# Patient Record
Sex: Male | Born: 1975 | Race: Black or African American | Hispanic: No | Marital: Married | Smoking: Never smoker
Health system: Southern US, Community
[De-identification: ages and names within clinical notes are randomized; demographics above are authoritative.]

## PROBLEM LIST (undated history)

## (undated) DIAGNOSIS — F32A Depression, unspecified: Secondary | ICD-10-CM

## (undated) DIAGNOSIS — E119 Type 2 diabetes mellitus without complications: Secondary | ICD-10-CM

## (undated) DIAGNOSIS — I739 Peripheral vascular disease, unspecified: Secondary | ICD-10-CM

## (undated) DIAGNOSIS — E11319 Type 2 diabetes mellitus with unspecified diabetic retinopathy without macular edema: Secondary | ICD-10-CM

## (undated) DIAGNOSIS — E13319 Other specified diabetes mellitus with unspecified diabetic retinopathy without macular edema: Secondary | ICD-10-CM

## (undated) DIAGNOSIS — F329 Major depressive disorder, single episode, unspecified: Secondary | ICD-10-CM

## (undated) DIAGNOSIS — E785 Hyperlipidemia, unspecified: Secondary | ICD-10-CM

## (undated) DIAGNOSIS — I10 Essential (primary) hypertension: Secondary | ICD-10-CM

## (undated) HISTORY — DX: Essential (primary) hypertension: I10

## (undated) HISTORY — DX: Hyperlipidemia, unspecified: E78.5

## (undated) HISTORY — DX: Other specified diabetes mellitus with unspecified diabetic retinopathy without macular edema: E13.319

## (undated) HISTORY — DX: Type 2 diabetes mellitus without complications: E11.9

## (undated) HISTORY — DX: Type 2 diabetes mellitus with unspecified diabetic retinopathy without macular edema: E11.319

## (undated) HISTORY — PX: EYE SURGERY: SHX253

## (undated) HISTORY — PX: WISDOM TOOTH EXTRACTION: SHX21

---

## 2017-07-13 ENCOUNTER — Ambulatory Visit
Admission: RE | Admit: 2017-07-13 | Discharge: 2017-07-13 | Disposition: A | Discharge status: Home / Self Care | Payer: Managed Care, Other (non HMO) | Source: Ambulatory Visit | Attending: Internal Medicine | Admitting: Internal Medicine

## 2017-07-13 ENCOUNTER — Other Ambulatory Visit
Admission: RE | Admit: 2017-07-13 | Discharge: 2017-07-13 | Disposition: A | Discharge status: Home / Self Care | Payer: Managed Care, Other (non HMO) | Source: Ambulatory Visit | Attending: Internal Medicine | Admitting: Internal Medicine

## 2017-07-13 ENCOUNTER — Other Ambulatory Visit: Payer: Self-pay | Admitting: Internal Medicine

## 2017-07-13 ENCOUNTER — Encounter: Payer: Managed Care, Other (non HMO) | Attending: Internal Medicine | Admitting: Internal Medicine

## 2017-07-13 DIAGNOSIS — S81801A Unspecified open wound, right lower leg, initial encounter: Secondary | ICD-10-CM | POA: Diagnosis present

## 2017-07-13 DIAGNOSIS — X58XXXA Exposure to other specified factors, initial encounter: Secondary | ICD-10-CM | POA: Diagnosis not present

## 2017-07-13 DIAGNOSIS — B999 Unspecified infectious disease: Secondary | ICD-10-CM | POA: Diagnosis not present

## 2017-07-13 DIAGNOSIS — I10 Essential (primary) hypertension: Secondary | ICD-10-CM | POA: Insufficient documentation

## 2017-07-13 DIAGNOSIS — L03115 Cellulitis of right lower limb: Secondary | ICD-10-CM | POA: Diagnosis not present

## 2017-07-13 DIAGNOSIS — L97511 Non-pressure chronic ulcer of other part of right foot limited to breakdown of skin: Secondary | ICD-10-CM | POA: Insufficient documentation

## 2017-07-13 DIAGNOSIS — E11621 Type 2 diabetes mellitus with foot ulcer: Secondary | ICD-10-CM | POA: Diagnosis not present

## 2017-07-13 DIAGNOSIS — E1142 Type 2 diabetes mellitus with diabetic polyneuropathy: Secondary | ICD-10-CM | POA: Diagnosis not present

## 2017-07-13 DIAGNOSIS — E1151 Type 2 diabetes mellitus with diabetic peripheral angiopathy without gangrene: Secondary | ICD-10-CM | POA: Insufficient documentation

## 2017-07-14 ENCOUNTER — Other Ambulatory Visit: Payer: Self-pay | Admitting: Internal Medicine

## 2017-07-14 DIAGNOSIS — L97511 Non-pressure chronic ulcer of other part of right foot limited to breakdown of skin: Secondary | ICD-10-CM

## 2017-07-15 ENCOUNTER — Ambulatory Visit (INDEPENDENT_AMBULATORY_CARE_PROVIDER_SITE_OTHER): Payer: Managed Care, Other (non HMO)

## 2017-07-15 DIAGNOSIS — L97511 Non-pressure chronic ulcer of other part of right foot limited to breakdown of skin: Secondary | ICD-10-CM

## 2017-07-15 NOTE — Progress Notes (Signed)
Ryan Leonard, Amritpal ([id number]) Visit Report for 07/13/2017 Abuse/Suicide Risk Screen Details Patient Name: Ryan Leonard, Ryan Leonard Date of Service: 07/13/2017 10:30 AM Medical Record Number: [id number] Patient Account Number: [id number] Date of Birth/Sex: Oct 17, 1975 [age] y.o. Male) Treating RN: Phillis HaggisPinkerton, Debi Primary Care Erica Osuna: Naoma DienerKOMIVES, EUGENIE Other Clinician: Referring [place]: Oren BeckmannBURKE, GARY Treating Khalidah Herbold/Extender: Maxwell CaulOBSON, MICHAEL G Weeks in Treatment: 0 Abuse/Suicide Risk Screen Items Answer ABUSE/SUICIDE RISK SCREEN: Has anyone close to you tried to hurt or harm you recentlyo No Do you feel uncomfortable with anyone in your familyo No Has anyone forced you do things that you didnot want to doo No Do you have any thoughts of harming yourselfo No Electronic Signature(s) Signed: 07/14/2017 3:16:44 PM By: Alejandro MullingPinkerton, Debra Entered By: Alejandro MullingPinkerton, Debra on 07/13/2017 11:03:09 Ryan Leonard, Ladarrion ([id number]) -------------------------------------------------------------------------------- Activities of Daily Living Details Patient Name: Ryan Leonard, Ryan Leonard Date of Service: 07/13/2017 10:30 AM Medical Record Number: [id number] Patient Account Number: [id number] Date of Birth/Sex: Oct 17, 1975 [age] y.o. Male) Treating RN: Phillis HaggisPinkerton, Debi Primary Care Bernhard Koskinen: Naoma DienerKOMIVES, EUGENIE Other Clinician: Referring Briar Sword: Oren BeckmannBURKE, GARY Treating Demetrie Borge/Extender: Maxwell CaulOBSON, MICHAEL G Weeks in Treatment: 0 Activities of Daily Living Items Answer Activities of Daily Living (Please select one for each item) Drive Automobile Completely Able Take Medications Completely Able Use Telephone Completely Able Care for Appearance Completely Able Use Toilet Completely Able Bath / Shower Completely Able Dress Self Completely Able Feed Self Completely Able Walk Completely Able Get In / Out Bed Completely Able Housework Completely Able Prepare Meals Completely Able Handle Money Completely Able Shop for Self Completely  Able Electronic Signature(s) Signed: 07/14/2017 3:16:44 PM By: Alejandro MullingPinkerton, Debra Entered By: Alejandro MullingPinkerton, Debra on 07/13/2017 11:03:25 Ryan Leonard, Coby ([id number]) -------------------------------------------------------------------------------- Education Assessment Details Patient Name: Ryan Leonard, Ryan Leonard Date of Service: 07/13/2017 10:30 AM Medical Record Number: [id number] Patient Account Number: [id number] Date of Birth/Sex: Oct 17, 1975 [age] y.o. Male) Treating RN: Phillis HaggisPinkerton, Debi Primary Care Edell Mesenbrink: Naoma DienerKOMIVES, EUGENIE Other Clinician: Referring Elvin Mccartin: Oren BeckmannBURKE, GARY Treating Duel Conrad/Extender: Altamese CarolinaOBSON, MICHAEL G Weeks in Treatment: 0 Primary Learner Assessed: Patient Learning Preferences/Education Level/Primary Language Learning Preference: Explanation, Printed Material Highest Education Level: College or Above Preferred Language: English Cognitive Barrier Assessment/Beliefs Language Barrier: No Translator Needed: No Memory Deficit: No Emotional Barrier: No Cultural/Religious Beliefs Affecting Medical Care: No Physical Barrier Assessment Impaired Vision: Yes Glasses Impaired Hearing: No Decreased Hand dexterity: No Knowledge/Comprehension Assessment Knowledge Level: Medium Comprehension Level: Medium Ability to understand written Medium instructions: Ability to understand verbal Medium instructions: Motivation Assessment Anxiety Level: Calm Cooperation: Cooperative Education Importance: Acknowledges Need Interest in Health Problems: Asks Questions Perception: Coherent Willingness to Engage in Self- Medium Management Activities: Readiness to Engage in Self- Medium Management Activities: Electronic Signature(s) Signed: 07/14/2017 3:16:44 PM By: Alejandro MullingPinkerton, Debra Entered By: Alejandro MullingPinkerton, Debra on 07/13/2017 11:03:48 Ryan Leonard, Leonell ([id number]) -------------------------------------------------------------------------------- Fall Risk Assessment Details Patient Name: Ryan Leonard,  Ryan Leonard Date of Service: 07/13/2017 10:30 AM Medical Record Number: [id number] Patient Account Number: [id number] Date of Birth/Sex: Oct 17, 1975 [age] y.o. Male) Treating RN: Ashok CordiaPinkerton, Debi Primary Care Gayla Benn: Naoma DienerKOMIVES, EUGENIE Other Clinician: Referring Suellen Durocher: Oren BeckmannBURKE, GARY Treating Deyci Gesell/Extender: Altamese CarolinaOBSON, MICHAEL G Weeks in Treatment: 0 Fall Risk Assessment Items Have you had 2 or more falls in the last 12 monthso 0 No Have you had any fall that resulted in injury in the last 12 monthso 0 No FALL RISK ASSESSMENT: History of falling - immediate or within 3 months 0 No Secondary diagnosis 0 No Ambulatory aid None/bed rest/wheelchair/nurse 0 No Crutches/cane/walker 0 No Furniture 0 No IV Access/Saline Lock 0 No Gait/Training Normal/bed rest/immobile 0 No Weak 0 No Impaired  0 No Mental Status Oriented to own ability 0 Yes Electronic Signature(s) Signed: 07/14/2017 3:16:44 PM By: Alejandro Mulling Entered By: Alejandro Mulling on 07/13/2017 11:03:56 Ryan Leonard ([id number]) -------------------------------------------------------------------------------- Foot Assessment Details Patient Name: Ryan Leonard Date of Service: 07/13/2017 10:30 AM Medical Record Number: [id number] Patient Account Number: [id number] Date of Birth/Sex: [birth year] (42 y.o. Male) Treating RN: Phillis Haggis Primary Care Jensine Luz: Naoma Diener Other Clinician: Referring Javonda Suh: Oren Beckmann Treating Iren Whipp/Extender: Maxwell Caul Weeks in Treatment: 0 Foot Assessment Items Site Locations + = Sensation present, - = Sensation absent, C = Callus, U = Ulcer R = Redness, W = Warmth, M = Maceration, PU = Pre-ulcerative lesion F = Fissure, S = Swelling, D = Dryness Assessment Right: Left: Other Deformity: No No Prior Foot Ulcer: No No Prior Amputation: No No Charcot Joint: No No Ambulatory Status: Ambulatory Without Help Gait: Steady Electronic Signature(s) Signed: 07/14/2017 3:16:44 PM By:  Alejandro Mulling Entered By: Alejandro Mulling on 07/13/2017 11:05:22 Ryan Leonard ([id number]) -------------------------------------------------------------------------------- Nutrition Risk Assessment Details Patient Name: Ryan Leonard Date of Service: 07/13/2017 10:30 AM Medical Record Number: [id number] Patient Account Number: [id number] Date of Birth/Sex: 04-Jan-1976 (42 y.o. Male) Treating RN: Ashok Cordia, Debi Primary Care Jesiah Yerby: Naoma Diener Other Clinician: Referring Jessyca Sloan: Oren Beckmann Treating Lamari Beckles/Extender: Maxwell Caul Weeks in Treatment: 0 Height (in): 72 Weight (lbs): 226.5 Body Mass Index (BMI): 30.7 Nutrition Risk Assessment Items NUTRITION RISK SCREEN: I have an illness or condition that made me change the kind and/or amount of 2 Yes food I eat I eat fewer than two meals per day 3 Yes I eat few fruits and vegetables, or milk products 0 No I have three or more drinks of beer, liquor or wine almost every day 0 No I have tooth or mouth problems that make it hard for me to eat 0 No I don't always have enough money to buy the food I need 0 No I eat alone most of the time 0 No I take three or more different prescribed or over-the-counter drugs a day 1 Yes Without wanting to, I have lost or gained 10 pounds in the last six months 0 No I am not always physically able to shop, cook and/or feed myself 0 No Nutrition Protocols Good Risk Protocol Moderate Risk Protocol Electronic Signature(s) Signed: 07/14/2017 3:16:44 PM By: Alejandro Mulling Entered By: Alejandro Mulling on 07/13/2017 11:04:14

## 2017-07-15 NOTE — Progress Notes (Signed)
VERDUN, RACKLEY ([id number]) Visit Report for 07/13/2017 Chief Complaint Document Details Patient Name: Ryan Leonard, Ryan Leonard Date of Service: 07/13/2017 10:30 AM Medical Record Number: [id number] Patient Account Number: [id number] Date of Birth/Sex: 09/14/1975 (42 y.o. Male) Treating RN: Phillis Haggis Primary Care Provider: Naoma Diener Other Clinician: Referring Provider: Oren Beckmann Treating Provider/Extender: Maxwell Caul Weeks in Treatment: 0 Information Obtained from: Patient Chief Complaint 07/13/17; patient is here for review of ulcers on his right foot in the setting of uncontrolled diabetes Electronic Signature(s) Signed: 07/13/2017 4:49:28 PM By: Baltazar Najjar MD Entered By: Baltazar Najjar on 07/13/2017 12:32:36 Ryan Leonard ([id number]) -------------------------------------------------------------------------------- Debridement Details Patient Name: Ryan Leonard Date of Service: 07/13/2017 10:30 AM Medical Record Number: [id number] Patient Account Number: [id number] Date of Birth/Sex: 11/28/1975 (42 y.o. Male) Treating RN: Phillis Haggis Primary Care Provider: Naoma Diener Other Clinician: Referring Provider: Oren Beckmann Treating Provider/Extender: Altamese [place] in Treatment: 0 Debridement Performed for Wound #2 Right,Proximal,Dorsal Foot Assessment: Performed By: Physician Maxwell Caul, MD Debridement: Debridement Severity of Tissue Pre Fat layer exposed Debridement: Pre-procedure Verification/Time Yes - 12:01 Out Taken: Start Time: 12:04 Pain Control: Lidocaine 4% Topical Solution Level: Skin/Subcutaneous Tissue Total Area Debrided (L x W): 1 (cm) x 0.9 (cm) = 0.9 (cm) Tissue and other material Viable, Non-Viable, Exudate, Fibrin/Slough, Subcutaneous debrided: Instrument: Curette Bleeding: Minimum Hemostasis Achieved: Pressure End Time: 12:06 Procedural Pain: 0 Post Procedural Pain: 0 Response to Treatment: Procedure was  tolerated well Post Debridement Measurements of Total Wound Length: (cm) 1 Width: (cm) 0.9 Depth: (cm) 0.2 Volume: (cm) 0.141 Character of Wound/Ulcer Post Debridement: Requires Further Debridement Severity of Tissue Post Debridement: Fat layer exposed Post Procedure Diagnosis Same as Pre-procedure Electronic Signature(s) Signed: 07/13/2017 4:49:28 PM By: Baltazar Najjar MD Signed: 07/14/2017 3:16:44 PM By: Alejandro Mulling Entered By: Baltazar Najjar on 07/13/2017 12:30:05 Ryan Leonard ([id number]) -------------------------------------------------------------------------------- Debridement Details Patient Name: Ryan Leonard Date of Service: 07/13/2017 10:30 AM Medical Record Number: [id number] Patient Account Number: [id number] Date of Birth/Sex: 07-25-1975 (42 y.o. Male) Treating RN: Phillis Haggis Primary Care Provider: Naoma Diener Other Clinician: Referring Provider: Oren Beckmann Treating Provider/Extender: Maxwell Caul Weeks in Treatment: 0 Debridement Performed for Wound #3 Right Lower Leg Assessment: Performed By: Physician Maxwell Caul, MD Debridement: Debridement Severity of Tissue Pre Fat layer exposed Debridement: Pre-procedure Verification/Time Yes - 12:01 Out Taken: Start Time: 12:02 Pain Control: Lidocaine 4% Topical Solution Level: Skin/Subcutaneous Tissue Total Area Debrided (L x W): 1.7 (cm) x 2.2 (cm) = 3.74 (cm) Tissue and other material Viable, Non-Viable, Exudate, Fibrin/Slough, Subcutaneous debrided: Instrument: Curette Specimen: Swab Number of Specimens Taken: 1 Bleeding: Minimum Hemostasis Achieved: Pressure End Time: 12:04 Procedural Pain: 0 Post Procedural Pain: 0 Response to Treatment: Procedure was tolerated well Post Debridement Measurements of Total Wound Length: (cm) 1.7 Width: (cm) 2.2 Depth: (cm) 0.2 Volume: (cm) 0.587 Character of Wound/Ulcer Post Debridement: Requires Further Debridement Severity of Tissue  Post Debridement: Fat layer exposed Post Procedure Diagnosis Same as Pre-procedure Electronic Signature(s) Signed: 07/13/2017 4:49:28 PM By: Baltazar Najjar MD Signed: 07/14/2017 3:16:44 PM By: Alejandro Mulling Entered By: Baltazar Najjar on 07/13/2017 12:30:13 Ryan Leonard ([id number]) -------------------------------------------------------------------------------- HPI Details Patient Name: Ryan Leonard Date of Service: 07/13/2017 10:30 AM Medical Record Number: [id number] Patient Account Number: [id number] Date of Birth/Sex: 1976/03/21 (42 y.o. Male) Treating RN: Phillis Haggis Primary Care Provider: Naoma Diener Other Clinician: Referring Provider: Oren Beckmann Treating Provider/Extender: Maxwell Caul Weeks in Treatment: 0 History of Present Illness HPI Description: 07/13/17; this is a 42 year old man  who is a poorly controlled diabetic with a hemoglobin A1c of over 14. Recently transitioned from oral agents to the addition of long-acting basal insulin. For the last 2 weeks he has had problems with ulcerations on his right foot this includes 2 shallow ulcerations one just distal to the ankle and a smaller one distal to this and a new recent blister just proximal to his toes. Last week his entire foot and lower ankle became intensely swollen and painful he was seen in the emergency room on 07/10/17 at the [facility] clinic in [employer] Diagnosed with a diabetic foot infection and put on Augmentin. This has helped somewhat with the swelling but he is still having a fair amount of pain and yesterday found it difficult to walk. He has not been systemically unwell. ABIs in this clinic were 0.88 and on the right 0.7-1 the left. He will definitely need formal arterial studies Electronic Signature(s) Signed: 07/13/2017 4:49:28 PM By: Baltazar Najjar MD Entered By: Baltazar Najjar on 07/13/2017 12:41:27 Ryan Leonard  ([id number]) -------------------------------------------------------------------------------- Incision and Drainage Details Patient Name: Ryan Leonard Date of Service: 07/13/2017 10:30 AM Medical Record Number: [id number] Patient Account Number: [id number] Date of Birth/Sex: Mar 31, 1976 (42 y.o. Male) Treating RN: Phillis Haggis Primary Care Provider: Naoma Diener Other Clinician: Referring Provider: Oren Beckmann Treating Provider/Extender: Maxwell Caul Weeks in Treatment: 0 Incision And Drainage Wound #1 Right, Distal, Dorsal Foot Performed for: Performed By: Physician Maxwell Caul, MD Incision And Drainage Single Type: Location: right distal dorsal foot Pre-procedure Yes - 11:58 Verification/Time Out Taken: Pain Control: Lidocaine 4% Topical Solution Drainage Of: Serous Bleeding: None Culture Sent: Swab Procedural Pain: 0 Post Procedural Pain: 0 Response to Treatment: Procedure was tolerated well Post Procedure Diagnosis Same as Pre-procedure Electronic Signature(s) Signed: 07/13/2017 4:49:28 PM By: Baltazar Najjar MD Entered By: Baltazar Najjar on 07/13/2017 12:29:56 Ryan Leonard ([id number]) -------------------------------------------------------------------------------- Physical Exam Details Patient Name: Ryan Leonard Date of Service: 07/13/2017 10:30 AM Medical Record Number: [id number] Patient Account Number: [id number] Date of Birth/Sex: 02/09/1976 (42 y.o. Male) Treating RN: Phillis Haggis Primary Care Provider: Naoma Diener Other Clinician: Referring Provider: Oren Beckmann Treating Provider/Extender: Maxwell Caul Weeks in Treatment: 0 Constitutional Patient is hypertensive.. Pulse regular and within target range for patient.Marland Kitchen Respirations regular, non-labored and within target range.. Temperature is normal and within the target range for the patient.Marland Kitchen appears in no distress. Eyes Conjunctivae clear. No discharge. Respiratory Respiratory  effort is easy and symmetric bilaterally. Rate is normal at rest and on room air.. Bilateral breath sounds are clear and equal in all lobes with no wheezes, rales or rhonchi.. Cardiovascular Heart rhythm and rate regular, without murmur or gallop.. pedal pulses are very faint. I had trouble with popliteal pulses femoral pulses palpable. Gastrointestinal (GI) Abdomen is soft and non-distended without masses or tenderness. Bowel sounds active in all quadrants.. Lymphatic none palpable in the popliteal or inguinal area. Integumentary (Hair, Skin) multiple dark healed macular areas on the anterior tibial areas of both legs and to a lesser extent his arms. There is not much of a raise surface to these and no obvious scaling. The patient states he's happen with minor trauma. Neurological decreased vibration and light touch. Psychiatric No evidence of depression, anxiety, or agitation. Calm, cooperative, and communicative. Appropriate interactions and affect.. Notes wound exam; he had 3 areas on the right foot. The largest is on the dorsal foot just distal to the ankle and a smaller area just distal to this. Both of these covered  in a nonviable surface. Using a #5 curet the surface was removed. The larger of the wounds was cultured. Just proximal to his toes in the midline was a blister that I opened and cultured. There is erythema on the entire forefoot which is warm and tender. By description this is better on the Augmentin however Electronic Signature(s) Signed: 07/13/2017 4:49:28 PM By: Baltazar Najjar MD Entered By: Baltazar Najjar on 07/13/2017 12:37:54 Ryan Leonard ([id number]) -------------------------------------------------------------------------------- Physician Orders Details Patient Name: Ryan Leonard Date of Service: 07/13/2017 10:30 AM Medical Record Number: [id number] Patient Account Number: [id number] Date of Birth/Sex: 07/21/1975 (42 y.o. Male) Treating RN: Phillis Haggis Primary Care Provider: Naoma Diener Other Clinician: Referring Provider: Oren Beckmann Treating Provider/Extender: Altamese  in Treatment: 0 Verbal / Phone Orders: Yes ClinicianAshok Cordia, Debi Read Back and Verified: Yes Diagnosis Coding Wound Cleansing Wound #1 Right,Distal,Dorsal Foot o Clean wound with Normal Saline. o Cleanse wound with mild soap and water o May Shower, gently pat wound dry prior to applying new dressing. Wound #2 Right,Proximal,Dorsal Foot o Clean wound with Normal Saline. o Cleanse wound with mild soap and water o May Shower, gently pat wound dry prior to applying new dressing. Wound #3 Right Lower Leg o Clean wound with Normal Saline. o Cleanse wound with mild soap and water o May Shower, gently pat wound dry prior to applying new dressing. Wound #4 Right Hand - 2nd Digit o Clean wound with Normal Saline. o Cleanse wound with mild soap and water o May Shower, gently pat wound dry prior to applying new dressing. Wound #5 Right Hand - 3rd Digit o Clean wound with Normal Saline. o Cleanse wound with mild soap and water o May Shower, gently pat wound dry prior to applying new dressing. Wound #6 Right Hand - 4th Digit o Clean wound with Normal Saline. o Cleanse wound with mild soap and water o May Shower, gently pat wound dry prior to applying new dressing. Anesthetic (add to Medication List) Wound #1 Right,Distal,Dorsal Foot o Topical Lidocaine 4% cream applied to wound bed prior to debridement (In Clinic Only). o Hurricaine Topical Anesthetic Spray applied to wound bed prior to debridement (In Clinic Only). Wound #2 Right,Proximal,Dorsal Foot o Topical Lidocaine 4% cream applied to wound bed prior to debridement (In Clinic Only). o Hurricaine Topical Anesthetic Spray applied to wound bed prior to debridement (In Clinic Only). Wound #3 Right Lower Leg o Topical Lidocaine 4% cream applied  to wound bed prior to debridement (In Clinic Only). o Hurricaine Topical Anesthetic Spray applied to wound bed prior to debridement (In Clinic Only). Wound #4 Right Hand - 2nd Digit Ryan Leonard, Ryan Leonard ([id number]) o Topical Lidocaine 4% cream applied to wound bed prior to debridement (In Clinic Only). o Hurricaine Topical Anesthetic Spray applied to wound bed prior to debridement (In Clinic Only). Wound #5 Right Hand - 3rd Digit o Topical Lidocaine 4% cream applied to wound bed prior to debridement (In Clinic Only). o Hurricaine Topical Anesthetic Spray applied to wound bed prior to debridement (In Clinic Only). Wound #6 Right Hand - 4th Digit o Topical Lidocaine 4% cream applied to wound bed prior to debridement (In Clinic Only). o Hurricaine Topical Anesthetic Spray applied to wound bed prior to debridement (In Clinic Only). Primary Wound Dressing Wound #1 Right,Distal,Dorsal Foot o [place] Non-Adherent Wound #2 Right,Proximal,Dorsal Foot o [place] Non-Adherent Wound #3 Right Lower Leg o [place] Non-Adherent Wound #4 Right Hand - 2nd Digit o [place] Non-Adherent Wound #5 Right Hand -  3rd Digit o [place] Non-Adherent Wound #6 Right Hand - 4th Digit o [place] Non-Adherent Secondary Dressing Wound #1 Right,Distal,Dorsal Foot o ABD pad o Conform/Kerlix o Other - tape, stretch netting #5 Wound #2 Right,Proximal,Dorsal Foot o ABD pad o Conform/Kerlix o Other - tape, stretch netting #5 Wound #3 Right Lower Leg o ABD pad o Conform/Kerlix o Other - tape, stretch netting #5 Wound #4 Right Hand - 2nd Digit o Other - coverlet/band-aide Wound #5 Right Hand - 3rd Digit o Other - coverlet/band-aide Wound #6 Right Hand - 4th Digit o Other - coverlet/band-aide Dressing Change Frequency Ryan Leonard, Ryan Leonard ([id number]) Wound #1 Right,Distal,Dorsal Foot o Change dressing every other day. o Other: - as needed Wound #2  Right,Proximal,Dorsal Foot o Change dressing every other day. o Other: - as needed Wound #3 Right Lower Leg o Change dressing every other day. o Other: - as needed Wound #4 Right Hand - 2nd Digit o Change dressing every other day. o Other: - as needed Wound #5 Right Hand - 3rd Digit o Change dressing every other day. o Other: - as needed Wound #6 Right Hand - 4th Digit o Change dressing every other day. o Other: - as needed Follow-up Appointments Wound #1 Right,Distal,Dorsal Foot o Return Appointment in 1 week. Wound #2 Right,Proximal,Dorsal Foot o Return Appointment in 1 week. Wound #3 Right Lower Leg o Return Appointment in 1 week. Wound #4 Right Hand - 2nd Digit o Return Appointment in 1 week. Wound #5 Right Hand - 3rd Digit o Return Appointment in 1 week. Wound #6 Right Hand - 4th Digit o Return Appointment in 1 week. Edema Control Wound #1 Right,Distal,Dorsal Foot o Elevate legs to the level of the heart and pump ankles as often as possible Wound #2 Right,Proximal,Dorsal Foot o Elevate legs to the level of the heart and pump ankles as often as possible Wound #3 Right Lower Leg o Elevate legs to the level of the heart and pump ankles as often as possible Additional Orders / Instructions Wound #1 Right,Distal,Dorsal Foot o Increase protein intake. Ryan Leonard, Ryan Leonard ([id number]) o Other: - Vitamin C, Zinc Wound #2 Right,Proximal,Dorsal Foot o Increase protein intake. o Other: - Vitamin C, Zinc Wound #3 Right Lower Leg o Increase protein intake. o Other: - Vitamin C, Zinc Wound #4 Right Hand - 2nd Digit o Increase protein intake. o Other: - Vitamin C, Zinc Wound #5 Right Hand - 3rd Digit o Increase protein intake. o Other: - Vitamin C, Zinc Wound #6 Right Hand - 4th Digit o Increase protein intake. o Other: - Vitamin C, Zinc Consults o Dermatology - [facility] o Bacteria  identified in Wound by Culture (MICRO) - L Lower Leg and R Dorsal Distal Foot oooo LOINC Code: 6462-6 oooo Convenience Name: Wound culture routine Radiology o X-ray, foot - Right o X-ray, lower leg - Right Services and Therapies o Arterial Studies- Bilateral - STAT Patient Medications Allergies: codeine Notifications Medication Indication Start End lidocaine DOSE 1 - topical 4 % cream - 1 cream topical doxycycline monohydrate Diabetic foot 07/13/2017 infection DOSE oral 100 mg capsule - 1 capsule oral bid in addition ot Augmentin for 1 week Electronic Signature(s) Signed: 07/13/2017 12:44:18 PM By: Baltazar Najjar MD Kinsey, Arlys John ([id number]) Entered By: Baltazar Najjar on 07/13/2017 12:44:15 Ryan Leonard ([id number]) -------------------------------------------------------------------------------- Prescription 07/13/2017 Patient Name: Ryan Leonard Provider: Maxwell Caul MD Date of Birth: 1975/12/18 NPI#: [id number] Sex: M DEA#: [id number] Phone #: [phone] License #: [id number] Patient Address: [facility] [address] [facility] [place], [place] [address] [address] [place], [place] [address] [phone] Allergies codeine Medication Medication: Route: Strength: Form: lidocaine 4 % topical cream topical 4% cream Class: TOPICAL LOCAL ANESTHETICS Dose: Frequency / Time: Indication: 1 1 cream topical Number of Refills: Number of Units: 0 Generic Substitution: Start Date: End Date: One Time Use: Substitution Permitted No Note to Pharmacy: Signature(s): Date(s): Electronic Signature(s) Signed: 07/13/2017 4:49:28 PM By: Baltazar Najjar MD Entered By: Baltazar Najjar on 07/13/2017 12:44:18 Ryan Leonard ([id number]) --------------------------------------------------------------------------------  Problem List Details Patient Name: Ryan Leonard Date of Service: 07/13/2017  10:30 AM Medical Record Number: [id number] Patient Account Number: [id number] Date of Birth/Sex: 1975/09/25 (42 y.o. 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 E11.51 Type 2 diabetes mellitus with diabetic peripheral angiopathy without 07/13/2017 Yes gangrene E11.42 Type 2 diabetes mellitus with diabetic polyneuropathy 07/13/2017 Yes Inactive Problems Resolved Problems Electronic Signature(s) Signed: 07/13/2017 4:49:28 PM By: Baltazar Najjar MD Entered By: Baltazar Najjar on 07/13/2017 12:29:35 Ryan Leonard ([id number]) -------------------------------------------------------------------------------- Progress Note Details Patient Name: Ryan Leonard Date of Service: 07/13/2017 10:30 AM Medical Record Number: [id number] Patient Account Number: [id number] Date of Birth/Sex: 12/23/1975 (42 y.o. Male) Treating RN: Phillis Haggis Primary Care Provider: Naoma Diener Other Clinician: Referring Provider: Oren Beckmann Treating Provider/Extender: Maxwell Caul Weeks in Treatment: 0 Subjective Chief Complaint Information obtained from Patient 07/13/17; patient is here for review of ulcers on his right foot in the setting of uncontrolled diabetes History of Present Illness (HPI) 07/13/17; this is a 42 year old man who is a poorly controlled diabetic with a hemoglobin A1c of over 14. Recently transitioned from oral agents to the addition of long-acting basal insulin. For the last 2 weeks he has had problems with ulcerations on his right foot this includes 2  shallow ulcerations one just distal to the ankle and a smaller one distal to this and a new recent blister just proximal to his toes. Last week his entire foot and lower ankle became intensely swollen and painful he was seen in the emergency room on 07/10/17 at the [facility] clinic in [facility]. Diagnosed with a diabetic foot infection and put on Augmentin. This has helped somewhat with the swelling but he is still having a fair amount of pain and yesterday found it difficult to walk. He has not been systemically unwell. ABIs in this clinic were 0.88 and on the right 0.7-1 the left. He will definitely need formal arterial studies Wound History Patient presents with 5 open wounds that have been present for approximately 3 weeks. Patient has been treating wounds in the following manner: abt ointment. Laboratory tests have not been performed in the last month. Patient reportedly has not tested positive for an antibiotic resistant organism. Patient reportedly has not tested positive for osteomyelitis. Patient reportedly has not had testing performed to evaluate circulation in the legs. Patient experiences the following problems associated with their wounds: infection, swelling. Patient History Information obtained from Patient. Allergies codeine Family History Cancer - Maternal Grandparents, Diabetes - Maternal Grandparents, Hypertension - Father, No family history of Heart Disease, Hereditary Spherocytosis, Kidney Disease, Lung  Disease, Seizures, Stroke, Thyroid Problems, Tuberculosis. Social History Never smoker, Marital Status - Married, Alcohol Use - Never, Drug Use - No History, Caffeine Use - Daily. Medical History Respiratory Patient has history of Asthma Cardiovascular Patient has history of Hypertension Endocrine Patient has history of Type II Diabetes Neurologic Ryan Leonard, Ryan Leonard ([id number]) Patient has history of Neuropathy Patient is treated with Insulin. Blood sugar is  tested. Review of Systems (ROS) Constitutional Symptoms (General Health) Complains or has symptoms of Chills. Eyes Complains or has symptoms of Glasses / Contacts. Ear/Nose/Mouth/Throat The patient has no complaints or symptoms. Hematologic/Lymphatic The patient has no complaints or symptoms. Cardiovascular hyperlipidemia Gastrointestinal gerd Genitourinary The patient has no complaints or symptoms. Immunological The patient has no complaints or symptoms. Integumentary (Skin) The patient has no complaints or symptoms. Musculoskeletal The patient has no complaints or symptoms. Oncologic The patient has no complaints or symptoms. Psychiatric Complains or has symptoms of Anxiety, depression Objective Constitutional Patient is hypertensive.. Pulse regular and within target range for patient.Marland Kitchen Respirations regular, non-labored and within target range.. Temperature is normal and within the target range for the patient.Marland Kitchen appears in no distress. Vitals Time Taken: 10:52 AM, Height: 72 in, Source: Stated, Weight: 226.5 lbs, Source: Measured, BMI: 30.7, Temperature: 98.2 F, Pulse: 87 bpm, Respiratory Rate: 20 breaths/min, Blood Pressure: 167/96 mmHg. General Notes: Pt states that he has not taken his BP meds today. Eyes Conjunctivae clear. No discharge. Respiratory Respiratory effort is easy and symmetric bilaterally. Rate is normal at rest and on room air.. Bilateral breath sounds are clear and equal in all lobes with no wheezes, rales or rhonchi.. Cardiovascular Heart rhythm and rate regular, without murmur or gallop.. pedal pulses are very faint. I had trouble with popliteal pulses femoral pulses palpable. [place], Arlys John ([id number]) Gastrointestinal (GI) Abdomen is soft and non-distended without masses or tenderness. Bowel sounds active in all quadrants.. Lymphatic none palpable in the popliteal or inguinal area. Neurological decreased vibration and light  touch. Psychiatric No evidence of depression, anxiety, or agitation. Calm, cooperative, and communicative. Appropriate interactions and affect.. General Notes: wound exam; he had 3 areas on the right foot. The largest is on the dorsal foot just distal to the ankle and a smaller area just distal to this. Both of these covered in a nonviable surface. Using a #5 curet the surface was removed. The larger of the wounds was cultured. Just proximal to his toes in the midline was a blister that I opened and cultured. There is erythema on the entire forefoot which is warm and tender. By description this is better on the Augmentin however Integumentary (Hair, Skin) multiple dark healed macular areas on the anterior tibial areas of both legs and to a lesser extent his arms. There is not much of a raise surface to these and no obvious scaling. The patient states he's happen with minor trauma. Wound #1 status is Open. Original cause of wound was Blister. The wound is located on the Right,Distal,Dorsal Foot. The wound measures 4cm length x 4.5cm width x 0.1cm depth; 14.137cm^2 area and 1.414cm^3 volume. There is no tunneling or undermining noted. There is a large amount of serous drainage noted. The wound margin is distinct with the outline attached to the wound base. There is no granulation within the wound bed. There is a large (67-100%) amount of necrotic tissue within the wound bed including Eschar. The periwound skin appearance exhibited: Erythema. The periwound skin appearance did not exhibit: Maceration. The surrounding wound skin color is noted with erythema  which is circumferential. Periwound temperature was noted as Hot. The periwound has tenderness on palpation. Wound #2 status is Open. Original cause of wound was Gradually Appeared. The wound is located on the Right,Proximal,Dorsal Foot. The wound measures 1cm length x 0.9cm width x 0.1cm depth; 0.707cm^2 area and 0.071cm^3 volume. There is no  tunneling or undermining noted. There is a large amount of serous drainage noted. The wound margin is distinct with the outline attached to the wound base. There is a large (67-100%) amount of necrotic tissue within the wound bed including Eschar. The periwound skin appearance exhibited: Erythema. The surrounding wound skin color is noted with erythema which is circumferential. Periwound temperature was noted as Hot. The periwound has tenderness on palpation. Wound #3 status is Open. Original cause of wound was Gradually Appeared. The wound is located on the Right Lower Leg. The wound measures 1.7cm length x 2.2cm width x 0.1cm depth; 2.937cm^2 area and 0.294cm^3 volume. There is no tunneling or undermining noted. There is a large amount of serous drainage noted. The wound margin is distinct with the outline attached to the wound base. There is no granulation within the wound bed. There is a large (67-100%) amount of necrotic tissue within the wound bed including Eschar. The periwound skin appearance exhibited: Erythema. The periwound skin appearance did not exhibit: Ecchymosis. The surrounding wound skin color is noted with erythema which is circumferential. Periwound temperature was noted as Hot. The periwound has tenderness on palpation. Wound #4 status is Open. Original cause of wound was Trauma. The wound is located on the Right Hand - 2nd Digit. The wound measures 0.5cm length x 0.6cm width x 0.1cm depth; 0.236cm^2 area and 0.024cm^3 volume. There is no tunneling or undermining noted. There is a large amount of serous drainage noted. The wound margin is distinct with the outline attached to the wound base. There is no granulation within the wound bed. There is a large (67-100%) amount of necrotic tissue within the wound bed including Eschar. Periwound temperature was noted as No Abnormality. The periwound has tenderness on palpation. Wound #5 status is Open. Original cause of wound was Trauma.  The wound is located on the Right Hand - 3rd Digit. The wound measures 2cm length x 1.8cm width x 0.1cm depth; 2.827cm^2 area and 0.283cm^3 volume. There is no tunneling or undermining noted. There is a large amount of serous drainage noted. The wound margin is distinct with the outline attached to the wound base. There is no granulation within the wound bed. There is a large (67-100%) amount of necrotic tissue within the wound bed including Eschar. Periwound temperature was noted as No Abnormality. The periwound has tenderness on palpation. Ryan Leonard, Ryan Leonard ([id number]) Wound #6 status is Open. Original cause of wound was Trauma. The wound is located on the Right Hand - 4th Digit. The wound measures 0.2cm length x 0.4cm width x 0.1cm depth; 0.063cm^2 area and 0.006cm^3 volume. There is no tunneling or undermining noted. There is a large amount of serous drainage noted. The wound margin is distinct with the outline attached to the wound base. There is no granulation within the wound bed. There is a large (67-100%) amount of necrotic tissue within the wound bed including Eschar. Periwound temperature was noted as No Abnormality. The periwound has tenderness on palpation. 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 There was a Skin/Subcutaneous Tissue Debridement ([id number]) debridement with total area of 0.9 sq cm performed by Maxwell Caul, MD. with the  following instrument(s): Curette to remove Viable and Non-Viable tissue/material including Exudate, Fibrin/Slough, and Subcutaneous after achieving pain control using Lidocaine 4% Topical Solution. A time out was conducted at 12:01, prior to the start of the procedure. A Minimum amount of bleeding was controlled with Pressure. The procedure was tolerated well with a pain level of 0 throughout and a pain level of 0 following the procedure. Post Debridement Measurements: 1cm length x 0.9cm width x 0.2cm depth; 0.141cm^3 volume. Character of Wound/Ulcer Post Debridement requires further debridement. Severity of Tissue Post Debridement is: Fat layer exposed. Post procedure Diagnosis Wound #2: Same as Pre-Procedure Wound #3 Pre-procedure diagnosis of Wound #3 is a Diabetic Wound/Ulcer of the Lower Extremity located on the Right Lower Leg .Severity of Tissue Pre Debridement is: Fat layer exposed. There was a Skin/Subcutaneous Tissue Debridement ([id number] 11047) debridement with total area of 3.74 sq cm performed by Maxwell Caul, MD. with the following instrument(s): Curette to remove Viable and Non-Viable tissue/material including Exudate, Fibrin/Slough, and Subcutaneous after achieving pain control using Lidocaine 4% Topical Solution. 1 Specimen was taken by a Swab and sent to the lab per facility protocol.A time out was conducted at 12:01, prior to the start of the procedure. A Minimum amount of bleeding was controlled with Pressure. The procedure was tolerated well with a pain level of 0 throughout and a pain level of 0 following the procedure. Post Debridement Measurements: 1.7cm length x 2.2cm width x 0.2cm depth; 0.587cm^3 volume. Character of Wound/Ulcer Post Debridement requires further debridement. Severity of Tissue Post Debridement is: Fat layer exposed. Post procedure Diagnosis Wound #3: Same as Pre-Procedure Wound #1 Pre-procedure diagnosis of Wound #1 is a Diabetic Wound/Ulcer of the  Lower Extremity located on the Right, Distal, Dorsal Ryan Leonard ([id number]) Foot . Single incision and drainage was provided by Maxwell Caul, MD. The skin was cleansed and prepped with anti-septic followed by pain control using Lidocaine 4% Topical Solution. An incision was made in the right distal dorsal foot. There was an immediate release of Serous fluid. There was no bleeding. A time out was conducted at 11:58, prior to the start of the procedure. Swab culture was sent. The procedure was tolerated well with a pain level of 0 throughout and a pain level of 0 following the procedure. Post procedure Diagnosis Wound #1: Same as Pre-Procedure Plan Wound Cleansing: Wound #1 Right,Distal,Dorsal Foot: Clean wound with Normal Saline. Cleanse wound with mild soap and water May Shower, gently pat wound dry prior to applying new dressing. Wound #2 Right,Proximal,Dorsal Foot: Clean wound with Normal Saline. Cleanse wound with mild soap and water May Shower, gently pat wound dry prior to applying new dressing. Wound #3 Right Lower Leg: Clean wound with Normal Saline. Cleanse wound with mild soap and water May Shower, gently pat wound dry prior to applying new dressing. Wound #4 Right Hand - 2nd Digit: Clean wound with Normal Saline. Cleanse wound with mild soap and water May Shower, gently pat wound dry prior to applying new dressing. Wound #5 Right Hand - 3rd  Digit: Clean wound with Normal Saline. Cleanse wound with mild soap and water May Shower, gently pat wound dry prior to applying new dressing. Wound #6 Right Hand - 4th Digit: Clean wound with Normal Saline. Cleanse wound with mild soap and water May Shower, gently pat wound dry prior to applying new dressing. Anesthetic (add to Medication List): Wound #1 Right,Distal,Dorsal Foot: Topical Lidocaine 4% cream applied to wound bed prior to debridement (In Clinic Only). Hurricaine Topical Anesthetic Spray applied to wound bed  prior to debridement (In Clinic Only). Wound #2 Right,Proximal,Dorsal Foot: Topical Lidocaine 4% cream applied to wound bed prior to debridement (In Clinic Only). Hurricaine Topical Anesthetic Spray applied to wound bed prior to debridement (In Clinic Only). Wound #3 Right Lower Leg: Topical Lidocaine 4% cream applied to wound bed prior to debridement (In Clinic Only). Hurricaine Topical Anesthetic Spray applied to wound bed prior to debridement (In Clinic Only). Wound #4 Right Hand - 2nd Digit: Topical Lidocaine 4% cream applied to wound bed prior to debridement (In Clinic Only). Hurricaine Topical Anesthetic Spray applied to wound bed prior to debridement (In Clinic Only). Wound #5 Right Hand - 3rd Digit: Topical Lidocaine 4% cream applied to wound bed prior to debridement (In Clinic Only). Hurricaine Topical Anesthetic Spray applied to wound bed prior to debridement (In Clinic Only). Wound #6 Right Hand - 4th Digit: Topical Lidocaine 4% cream applied to wound bed prior to debridement (In Clinic Only). Hurricaine Topical Anesthetic Spray applied to wound bed prior to debridement (In Clinic Only). Primary Wound Dressing: Ryan ChurchWILLIAMS, Ryan Leonard ([id number]) Wound #1 Right,Distal,Dorsal Foot: [place] Non-Adherent Wound #2 Right,Proximal,Dorsal Foot: [place] Non-Adherent Wound #3 Right Lower Leg: [place] Non-Adherent Wound #4 Right Hand - 2nd Digit: [place] Non-Adherent Wound #5 Right Hand - 3rd Digit: [place] Non-Adherent Wound #6 Right Hand - 4th Digit: [place] Non-Adherent Secondary Dressing: Wound #1 Right,Distal,Dorsal Foot: ABD pad Conform/Kerlix Other - tape, stretch netting #5 Wound #2 Right,Proximal,Dorsal Foot: ABD pad Conform/Kerlix Other - tape, stretch netting #5 Wound #3 Right Lower Leg: ABD pad Conform/Kerlix Other - tape, stretch netting #5 Wound #4 Right Hand - 2nd Digit: Other - coverlet/band-aide Wound #5 Right Hand - 3rd Digit: Other -  coverlet/band-aide Wound #6 Right Hand - 4th Digit: Other - coverlet/band-aide Dressing Change Frequency: Wound #1 Right,Distal,Dorsal Foot: Change dressing every other day. Other: - as needed Wound #2 Right,Proximal,Dorsal Foot: Change dressing every other day. Other: - as needed Wound #3 Right Lower Leg: Change dressing every other day. Other: - as needed Wound #4 Right Hand - 2nd Digit: Change dressing every other day. Other: - as needed Wound #5 Right Hand - 3rd Digit: Change dressing every other day. Other: - as needed Wound #6 Right Hand - 4th Digit: Change dressing every other day. Other: - as needed Follow-up Appointments: Wound #1 Right,Distal,Dorsal Foot: Return Appointment in 1 week. Wound #2 Right,Proximal,Dorsal Foot: Return Appointment in 1 week. Wound #3 Right Lower Leg: Return Appointment in 1 week. Wound #4 Right Hand - 2nd Digit: Return Appointment in 1 week. Wound #5 Right Hand - 3rd Digit: Ryan ChurchWILLIAMS, Ryan Leonard ([id number]) Return Appointment in 1 week. Wound #6 Right Hand - 4th Digit: Return Appointment in 1 week. Edema Control: Wound #1 Right,Distal,Dorsal Foot: Elevate legs to the level of the heart and pump ankles as often as possible Wound #2 Right,Proximal,Dorsal Foot: Elevate legs to the level of the heart and pump ankles as often as possible Wound #3 Right Lower Leg: Elevate legs to the level of the heart  and pump ankles as often as possible Additional Orders / Instructions: Wound #1 Right,Distal,Dorsal Foot: Increase protein intake. Other: - Vitamin C, Zinc Wound #2 Right,Proximal,Dorsal Foot: Increase protein intake. Other: - Vitamin C, Zinc Wound #3 Right Lower Leg: Increase protein intake. Other: - Vitamin C, Zinc Wound #4 Right Hand - 2nd Digit: Increase protein intake. Other: - Vitamin C, Zinc Wound #5 Right Hand - 3rd Digit: Increase protein intake. Other: - Vitamin C, Zinc Wound #6 Right Hand - 4th Digit: Increase protein  intake. Other: - Vitamin C, Zinc Laboratory ordered were: Wound culture routine - L Lower Leg and R Dorsal Distal Foot Services and Therapies ordered were: Arterial Studies- Bilateral - STAT Consults ordered were: Dermatology - [facility] Dermatology Radiology ordered were: X-ray, foot - Right, X-ray, lower leg - Right The following medication(s) was prescribed: lidocaine topical 4 % cream 1 1 cream topical was prescribed at facility doxycycline monohydrate oral 100 mg capsule 1 capsule oral bid in addition ot Augmentin for 1 week for Diabetic foot infection starting 07/13/2017 #1 silver alginate to the 3 areas on his foot. He will need to change the dressing daily at home #22 cultures were done I will add doxycycline to the Augmentin for completeness #3 the patient had an ABI of 0.8 on the right side. He'll need formal arterial evaluation as soon as possible #4 he needs an x-ray of the left foot and ankle #5 the patient is active at work [employer] written him a note to get off work for at least 2 weeks to ensure that the infection part of this is resolved #6 healing sandal Ryan Leonard, Ryan Leonard ([id number]) #7 the patient appears to have some form of primary skin problem I wondered about diabetic lipoidica although these lesions are not raised and is waxy is previous ones I have seen and they are multiple Electronic Signature(s) Signed: 07/13/2017 12:44:39 PM By: Baltazar Najjar MD Entered By: Baltazar Najjar on 07/13/2017 12:44:39 Ryan Leonard ([id number]) -------------------------------------------------------------------------------- ROS/PFSH Details Patient Name: Ryan Leonard Date of Service: 07/13/2017 10:30 AM Medical Record Number: [id number] Patient Account Number: [id number] Date of Birth/Sex: [birth year] (42 y.o. Male) Treating RN: Phillis Haggis Primary Care Provider: Naoma Diener Other Clinician: Referring Provider: Oren Beckmann Treating Provider/Extender: Maxwell Caul Weeks in  Treatment: 0 Information Obtained From Patient Wound History Do you currently have one or more open woundso Yes How many open wounds do you currently haveo 5 Approximately how long have you had your woundso 3 weeks How have you been treating your wound(s) until nowo abt ointment Has your wound(s) ever healed and then re-openedo No Have you had any lab work done in the past montho No Have you tested positive for an antibiotic resistant organism (MRSA, VRE)o No Have you tested positive for osteomyelitis (bone infection)o No Have you had any tests for circulation on your legso No Have you had other problems associated with your woundso Infection, Swelling Constitutional Symptoms (General Health) Complaints and Symptoms: Positive for: Chills Eyes Complaints and Symptoms: Positive for: Glasses / Contacts Psychiatric Complaints and Symptoms: Positive for: Anxiety Review of System Notes: depression Ear/Nose/Mouth/Throat Complaints and Symptoms: No Complaints or Symptoms Hematologic/Lymphatic Complaints and Symptoms: No Complaints or Symptoms Respiratory Medical History: Positive for: Asthma Cardiovascular Complaints and Symptoms: Review of System Notes: Ryan Leonard, Ryan Leonard ([id number]) hyperlipidemia Medical History: Positive for: Hypertension Gastrointestinal Complaints and Symptoms: Review of System Notes: gerd Endocrine Medical History: Positive for: Type II Diabetes Time with diabetes: 11 yrs Treated with: Insulin Blood sugar tested every day:  Yes Tested : 2x day Genitourinary Complaints and Symptoms: No Complaints or Symptoms Immunological Complaints and Symptoms: No Complaints or Symptoms Integumentary (Skin) Complaints and Symptoms: No Complaints or Symptoms Musculoskeletal Complaints and Symptoms: No Complaints or Symptoms Neurologic Medical History: Positive for: Neuropathy Oncologic Complaints and Symptoms: No Complaints or  Symptoms Immunizations Pneumococcal Vaccine: Received Pneumococcal Vaccination: No Implantable Devices Family and Social History Cancer: Yes - Maternal Grandparents; Diabetes: Yes - Maternal Grandparents; Heart Disease: No; Hereditary Spherocytosis: No; Hypertension: Yes - Father; Kidney Disease: No; Lung Disease: No; Seizures: No; Stroke: No; Thyroid Ryan Leonard, Ryan Leonard ([id number]) Problems: No; Tuberculosis: No; Never smoker; Marital Status - Married; Alcohol Use: Never; Drug Use: No History; Caffeine Use: Daily; Financial Concerns: No; Food, Clothing or Shelter Needs: No; Support System Lacking: No; Transportation Concerns: No; Advanced Directives: No; Patient does not want information on Advanced Directives; Do not resuscitate: No; Living Will: Yes (Not Provided); Medical Power of Attorney: No Electronic Signature(s) Signed: 07/13/2017 4:49:28 PM By: Baltazar Najjar MD Signed: 07/14/2017 3:16:44 PM By: Alejandro Mulling Entered By: Alejandro Mulling on 07/13/2017 11:03:00 Ryan Leonard ([id number]) -------------------------------------------------------------------------------- SuperBill Details Patient Name: Ryan Leonard Date of Service: 07/13/2017 Medical Record Number: [id number] Patient Account Number: [id number] Date of Birth/Sex: [birth year] (42 y.o. Male) Treating RN: Phillis Haggis Primary Care Provider: Naoma Diener Other Clinician: Referring Provider: Oren Beckmann Treating Provider/Extender: Maxwell Caul Weeks in Treatment: 0 Diagnosis Coding ICD-10 Codes Code Description E11.621 Type 2 diabetes mellitus with foot ulcer L03.115 Cellulitis of right lower limb L97.511 Non-pressure chronic ulcer of other part of right foot limited to breakdown of skin E11.51 Type 2 diabetes mellitus with diabetic peripheral angiopathy without gangrene E11.42 Type 2 diabetes mellitus with diabetic polyneuropathy Facility Procedures CPT4 Code Description: [id number] 99213 - WOUND CARE  VISIT-LEV 3 EST PT Modifier: Quantity: 1 CPT4 Code Description: [id number] 10060 - I and D Abscess; simple/single ICD-10 Diagnosis Description L97.511 Non-pressure chronic ulcer of other part of right foot limited Modifier: to breakdown of Quantity: 1 skin CPT4 Code Description: [id number] 11042 - DEB SUBQ TISSUE 20 SQ CM/< ICD-10 Diagnosis Description L97.511 Non-pressure chronic ulcer of other part of right foot limited Modifier: to breakdown of Quantity: 1 skin Physician Procedures CPT4 Code Description: [id number] 74259 - WC PHYS LEVEL 4 - NEW PT ICD-10 Diagnosis Description E11.621 Type 2 diabetes mellitus with foot ulcer L03.115 Cellulitis of right lower limb L97.511 Non-pressure chronic ulcer of other part of right foot limited  E11.51 Type 2 diabetes mellitus with diabetic peripheral angiopathy wi Modifier: 25 to breakdown of thout gangrene Quantity: 1 skin CPT4 Code Description: [id number] 10060 - I and D Abscess; simple/single ICD-10 Diagnosis Description L97.511 Non-pressure chronic ulcer of other part of right foot limited Modifier: to breakdown of Quantity: 1 skin CPT4 Code Description: 4332951 11042 - WC PHYS SUBQ TISS 20 SQ CM ICD-10 Diagnosis Description L97.511 Non-pressure chronic ulcer of other part of right foot limited RICHARDS, PHERIGO ([id number]) Modifier: to breakdown of Quantity: 1 skin Electronic Signature(s) Signed: 07/13/2017 12:48:58 PM By: Alejandro Mulling Signed: 07/13/2017 4:49:28 PM By: Baltazar Najjar MD Entered By: Alejandro Mulling on 07/13/2017 12:48:57

## 2017-07-16 LAB — AEROBIC CULTURE W GRAM STAIN (SUPERFICIAL SPECIMEN): Culture: NO GROWTH

## 2017-07-16 LAB — AEROBIC CULTURE  (SUPERFICIAL SPECIMEN): CULTURE: NO GROWTH

## 2017-07-19 NOTE — Progress Notes (Signed)
REGINA, Ryan Leonard ([id number]) Visit Report for 07/13/2017 Allergy List Details Patient Name: Ryan Leonard, Ryan Leonard Date of Service: 07/13/2017 10:30 AM Medical Record Number: [id number] Patient Account Number: [id number] Date of Birth/Sex: 05-16-1976 (42 y.o. Male) Treating RN: Phillis Haggis Primary Care Kayliegh Boyers: Naoma Diener Other Clinician: Referring Andalyn Heckstall: Oren Beckmann Treating Bethaney Oshana/Extender: Maxwell Caul Weeks in Treatment: 0 Allergies Active Allergies codeine Allergy Notes Electronic Signature(s) Signed: 07/14/2017 3:16:44 PM By: Alejandro Mulling Entered By: Alejandro Mulling on 07/13/2017 10:55:43 Ryan Leonard ([id number]) -------------------------------------------------------------------------------- Arrival Information Details Patient Name: Ryan Leonard Date of Service: 07/13/2017 10:30 AM Medical Record Number: [id number] Patient Account Number: [id number] Date of Birth/Sex: 1975-12-20 (42 y.o. Male) Treating RN: Phillis Haggis Primary Care Kaytelyn Glore: Naoma Diener Other Clinician: Referring Burgess Sheriff: Oren Beckmann Treating Benito Lemmerman/Extender: Altamese [place] in Treatment: 0 Visit Information Patient Arrived: Ambulatory Arrival Time: 10:49 Accompanied By: wife, son Transfer Assistance: None Patient Identification Verified: Yes Secondary Verification Process Completed: Yes Patient Requires Transmission-Based No Precautions: Patient Has Alerts: Yes Patient Alerts: DM II Electronic Signature(s) Signed: 07/14/2017 3:16:44 PM By: Alejandro Mulling Entered By: Alejandro Mulling on 07/13/2017 10:51:15 Ryan Leonard ([id number]) -------------------------------------------------------------------------------- Clinic Level of Care Assessment Details Patient Name: Ryan Leonard Date of Service: 07/13/2017 10:30 AM Medical Record Number: [id number] Patient Account Number: [id number] Date of Birth/Sex: 28-Feb-1976 (42 y.o. Male) Treating RN: Ashok Cordia,  Debi Primary Care Kalli Greenfield: Naoma Diener Other Clinician: Referring Carolos Fecher: Oren Beckmann Treating Edrei Norgaard/Extender: Maxwell Caul Weeks in Treatment: 0 Clinic Level of Care Assessment Items TOOL 1 Quantity Score X - Use when EandM and Procedure is performed on INITIAL visit 1 0 ASSESSMENTS - Nursing Assessment / Reassessment X - General Physical Exam (combine w/ comprehensive assessment (listed just below) when 1 20 performed on new pt. evals) X- 1 25 Comprehensive Assessment (HX, ROS, Risk Assessments, Wounds Hx, etc.) ASSESSMENTS - Wound and Skin Assessment / Reassessment []  - Dermatologic / Skin Assessment (not related to wound area) 0 ASSESSMENTS - Ostomy and/or Continence Assessment and Care []  - Incontinence Assessment and Management 0 []  - 0 Ostomy Care Assessment and Management (repouching, etc.) PROCESS - Coordination of Care X - Simple Patient / Family Education for ongoing care 1 15 []  - 0 Complex (extensive) Patient / Family Education for ongoing care []  - 0 Staff obtains Chiropractor, Records, Test Results / Process Orders []  - 0 Staff telephones HHA, Nursing Homes / Clarify orders / etc []  - 0 Routine Transfer to another Facility (non-emergent condition) []  - 0 Routine Hospital Admission (non-emergent condition) X- 1 15 New Admissions / Manufacturing engineer / Ordering NPWT, Apligraf, etc. []  - 0 Emergency Hospital Admission (emergent condition) PROCESS - Special Needs []  - Pediatric / Minor Patient Management 0 []  - 0 Isolation Patient Management []  - 0 Hearing / Language / Visual special needs []  - 0 Assessment of Community assistance (transportation, D/C planning, etc.) []  - 0 Additional assistance / Altered mentation []  - 0 Support Surface(s) Assessment (bed, cushion, seat, etc.) D'ARCY, ABRAHA ([id number]) INTERVENTIONS - Miscellaneous []  - External ear exam 0 []  - 0 Patient Transfer (multiple staff / Nurse, adult / Similar devices) []  -  0 Simple Staple / Suture removal (25 or less) []  - 0 Complex Staple / Suture removal (26 or more) []  - 0 Hypo/Hyperglycemic Management (do not check if billed separately) X- 1 15 Ankle / Brachial Index (ABI) - do not check if billed separately Has the patient been seen at the hospital within the last three years: Yes Total Score: 90 Level Of  Care: New/Established - Level 3 Electronic Signature(s) Signed: 07/14/2017 3:16:44 PM By: Alejandro Mulling Entered By: Alejandro Mulling on 07/13/2017 12:48:46 Ryan Leonard ([id number]) -------------------------------------------------------------------------------- Encounter Discharge Information Details Patient Name: Ryan Leonard Date of Service: 07/13/2017 10:30 AM Medical Record Number: [id number] Patient Account Number: [id number] Date of Birth/Sex: Feb 07, 1976 (42 y.o. Male) Treating RN: Phillis Haggis Primary Care Shayle Donahoo: Naoma Diener Other Clinician: Referring Eisley Barber: Oren Beckmann Treating Tysha Grismore/Extender: Altamese [place] in Treatment: 0 Encounter Discharge Information Items Discharge Pain Level: 0 Discharge Condition: Stable Ambulatory Status: Ambulatory Discharge Destination: Home Transportation: Private Auto Accompanied By: wife, son Schedule Follow-up Appointment: Yes Medication Reconciliation completed and No provided to Patient/Care Armarion Greek: Provided on Clinical Summary of Care: 07/13/2017 Form Type Recipient Paper Patient BW Electronic Signature(s) Signed: 07/19/2017 2:25:08 PM By: Gwenlyn Perking Previous Signature: 07/13/2017 11:46:37 AM Version By: Alejandro Mulling Entered By: Gwenlyn Perking on 07/13/2017 12:24:42 Ryan Leonard ([id number]) -------------------------------------------------------------------------------- Lower Extremity Assessment Details Patient Name: Ryan Leonard Date of Service: 07/13/2017 10:30 AM Medical Record Number: [id number] Patient Account Number: [id number] Date of  Birth/Sex: 1976-04-28 (42 y.o. Male) Treating RN: Ashok Cordia, Debi Primary Care Takeesha Isley: Naoma Diener Other Clinician: Referring Imanni Burdine: Oren Beckmann Treating Khye Hochstetler/Extender: Maxwell Caul Weeks in Treatment: 0 Edema Assessment Assessed: [Left: No] [Right: No] Edema: [Left: Yes] [Right: Yes] Calf Left: Right: Point of Measurement: 36 cm From Medial Instep 42 cm 41.6 cm Ankle Left: Right: Point of Measurement: 11 cm From Medial Instep 24.4 cm 26.8 cm Vascular Assessment Pulses: Dorsalis Pedis Palpable: [Left:No] [Right:No] Doppler Audible: [Left:Yes] [Right:Yes] Posterior Tibial Palpable: [Left:No] [Right:No] Doppler Audible: [Left:Yes] [Right:Yes] Extremity colors, hair growth, and conditions: Extremity Color: [Left:Hyperpigmented] [Right:Hyperpigmented] Hair Growth on Extremity: [Left:Yes] [Right:Yes] Temperature of Extremity: [Left:Warm] [Right:Warm] Capillary Refill: [Left:< 3 seconds] [Right:< 3 seconds] Blood Pressure: Brachial: [Left:170] [Right:170] Dorsalis Pedis: 122 [Left:Dorsalis Pedis: 150] Ankle: Posterior Tibial: 120 [Left:Posterior Tibial: 140 0.72] [Right:0.88] Toe Nail Assessment Left: Right: Thick: Yes Yes Discolored: Yes Yes Deformed: Yes Yes Improper Length and Hygiene: Yes Yes Electronic Signature(s) Signed: 07/14/2017 3:16:44 PM By: Alejandro Mulling Entered By: Alejandro Mulling on 07/13/2017 11:18:46 Ryan Leonard ([id number]) [place], Arlys John ([id number]) -------------------------------------------------------------------------------- Multi Wound Chart Details Patient Name: Ryan Leonard Date of Service: 07/13/2017 10:30 AM Medical Record Number: [id number] Patient Account Number: [id number] Date of Birth/Sex: 1975/07/04 (42 y.o. 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] [3:Diabetic Wound/Ulcer of the Lower Extremity] Comorbid History: [1:Asthma, Hypertension, Type II Diabetes, Neuropathy] [2:Asthma, Hypertension, Type II Diabetes, Neuropathy] [3:Asthma, Hypertension, Type II Diabetes, Neuropathy] Date Acquired: [1:07/09/2017] [2:06/29/2017] [3:06/29/2017] Weeks of Treatment: [1:0] [2:0] [3:0] Wound Status: [1:Open] [2:Open] [3:Open] Pending Amputation on [1:Yes] [2:Yes] [3:Yes] Presentation: Measurements L x W x D [1:4x4.5x0.1] [2:1x0.9x0.1] [3:1.7x2.2x0.1] (cm) Area (cm) : [1:14.137] [2:0.707] [3:2.937] Volume (cm) : [1:1.414] [2:0.071] [3:0.294] % Reduction in Area: [1:0.00%] [2:0.00%] [3:N/A] % Reduction in Volume: [1:0.00%] [2:0.00%] [3:N/A] Classification: [1:Grade 1] [2:Grade 2] [3:Grade 2] Exudate Amount: [1:Large] [2:Large] [3:Large] Exudate Type: [1:Serous] [2:Serous] [3:Serous] Exudate Color: [1:amber] [2:amber] [3:amber] Wound Margin: [1:Distinct, outline attached] [2:Distinct, outline attached] [3:Distinct,  outline attached] Granulation Amount: [1:None Present (0%)] [2:N/A] [3:None Present (0%)] Necrotic Amount: [1:Large (67-100%)] [2:N/A] [3:Large (67-100%)] Necrotic Tissue: [1:Eschar] [2:Eschar] [3:Eschar] Exposed Structures: [1:Fascia: No Fat Layer (Subcutaneous Tissue) Exposed: No Tendon: No Muscle: No Joint: No Bone: No] [2:Fascia: No Fat Layer (Subcutaneous Tissue) Exposed: No Tendon: No Muscle: No Joint: No Bone: No] [3:Fascia: No  Fat Layer (Subcutaneous Tissue) Exposed:  No Tendon: No Muscle: No Joint: No Bone: No] Epithelialization: [1:None] [2:None] [3:None] Debridement: [1:N/A] [2:Debridement (11042-11047)] [3:Debridement (11042-11047)] Pre-procedure [1:N/A] [2:12:01] [3:12:01] Verification/Time Out Taken: Ryan ChurchWILLIAMS, Esvin ([id number]) Pain Control: N/A Lidocaine 4% Topical Solution Lidocaine 4% Topical Solution Tissue Debrided: N/A Fibrin/Slough, Exudates, Fibrin/Slough, Exudates, Subcutaneous Subcutaneous Level: N/A Skin/Subcutaneous Tissue Skin/Subcutaneous Tissue Debridement Area (sq cm): N/A 0.9 3.74 Instrument: N/A Curette Curette Specimen: N/A None Swab Number of Specimens N/A N/A 1 Taken: Bleeding: N/A Minimum Minimum Hemostasis Achieved: N/A Pressure Pressure Procedural Pain: N/A 0 0 Post Procedural Pain: N/A 0 0 Debridement Treatment N/A Procedure was tolerated well Procedure was tolerated well Response: Post Debridement N/A 1x0.9x0.2 1.7x2.2x0.2 Measurements L x W x D (cm) Post Debridement Volume: N/A 0.141 0.587 (cm) Periwound Skin Texture: No Abnormalities Noted No Abnormalities Noted No Abnormalities Noted Periwound Skin Moisture: Maceration: No No Abnormalities Noted No Abnormalities Noted Periwound Skin Color: Erythema: Yes Erythema: Yes Erythema: Yes Ecchymosis: No Erythema Location: Circumferential Circumferential Circumferential Temperature: Hot Hot Hot Tenderness on Palpation: Yes Yes Yes Wound Preparation: Ulcer Cleansing: Ulcer Cleansing: Ulcer Cleansing: Rinsed/Irrigated with Saline Rinsed/Irrigated with Saline Rinsed/Irrigated with Saline Topical Anesthetic Applied: Topical Anesthetic Applied: Topical Anesthetic Applied: Other: lidocaine 4% Other: lidocaine 4% Other: lidocaine 4% Procedures Performed: Incision and Drainage Debridement Debridement Wound Number: [id number]  Photos: No Photos No Photos No Photos Wound Location: Right Hand - 2nd Digit Right Hand - 3rd Digit  Right Hand - 4th Digit Wounding Event: Trauma Trauma Trauma Primary Etiology: Trauma, Other Trauma, Other Trauma, Other Comorbid History: Asthma, Hypertension, Type II Asthma, Hypertension, Type II Asthma, Hypertension, Type II Diabetes, Neuropathy Diabetes, Neuropathy Diabetes, Neuropathy Date Acquired: 06/29/2017 06/29/2017 06/29/2017 Weeks of Treatment: 0 0 0 Wound Status: Open Open Open Pending Amputation on Yes Yes Yes Presentation: Measurements L x W x D 0.5x0.6x0.1 2x1.8x0.1 0.2x0.4x0.1 (cm) Area (cm) : 0.236 2.827 0.063 Volume (cm) : 0.024 0.283 0.006 % Reduction in Area: N/A N/A N/A % Reduction in Volume: N/A N/A N/A Classification: Full Thickness Without Full Thickness Without Full Thickness Without Exposed Support Structures Exposed Support Structures Exposed Support Structures Exudate Amount: Large Large Large Exudate Type: Serous Serous Serous Exudate Color: amber amber amber TappanWILLIAMS, Arlys JohnBRIAN ([id number]) Wound Margin: Distinct, outline attached Distinct, outline attached Distinct, outline attached Granulation Amount: None Present (0%) None Present (0%) None Present (0%) Necrotic Amount: Large (67-100%) Large (67-100%) Large (67-100%) Necrotic Tissue: Eschar Eschar Eschar Exposed Structures: Fascia: No Fascia: No Fascia: No Fat Layer (Subcutaneous Fat Layer (Subcutaneous Fat Layer (Subcutaneous Tissue) Exposed: No Tissue) Exposed: No Tissue) Exposed: No Tendon: No Tendon: No Tendon: No Muscle: No Muscle: No Muscle: No Joint: No Joint: No Joint: No Bone: No Bone: No Bone: No Epithelialization: None None None Debridement: N/A N/A N/A Pain Control: N/A N/A N/A Tissue Debrided: N/A N/A N/A Level: N/A N/A N/A Debridement Area (sq cm): N/A N/A N/A Instrument: N/A N/A N/A Bleeding: N/A N/A N/A Hemostasis Achieved: N/A N/A N/A Procedural Pain: N/A N/A N/A Post Procedural Pain: N/A N/A N/A Debridement Treatment N/A N/A N/A Response: Post Debridement N/A  N/A N/A Measurements L x W x D (  cm) Post Debridement Volume: N/A N/A N/A (cm) Periwound Skin Texture: No Abnormalities Noted No Abnormalities Noted No Abnormalities Noted Periwound Skin Moisture: No Abnormalities Noted No Abnormalities Noted No Abnormalities Noted Periwound Skin Color: No Abnormalities Noted No Abnormalities Noted No Abnormalities Noted Erythema Location: N/A N/A N/A Temperature: No Abnormality No Abnormality No Abnormality Tenderness on Palpation: Yes Yes Yes Wound Preparation: Ulcer Cleansing: Ulcer Cleansing: Ulcer Cleansing: Rinsed/Irrigated with Saline Rinsed/Irrigated with Saline Rinsed/Irrigated with Saline Topical Anesthetic Applied: Topical Anesthetic Applied: Topical Anesthetic Applied: Other: lidocaine 4% Other: lidocaine 4% Other: lidocaine 4% Procedures Performed: N/A N/A N/A Treatment Notes Electronic Signature(s) Signed: 07/13/2017 4:49:28 PM By: Baltazar Najjar MD Previous Signature: 07/13/2017 11:42:14 AM Version By: Alejandro Mulling Entered By: Baltazar Najjar on 07/13/2017 12:29:46 Ryan Leonard ([id number]) -------------------------------------------------------------------------------- Multi-Disciplinary Care Plan Details Patient Name: Ryan Leonard Date of Service: 07/13/2017 10:30 AM Medical Record Number: [id number] Patient Account Number: [id number] Date of Birth/Sex: Jan 30, 1976 (42 y.o. Male) Treating RN: Phillis Haggis Primary Care Kostantinos Tallman: Naoma Diener Other Clinician: Referring Duayne Brideau: Oren Beckmann Treating David Towson/Extender: Altamese [place] in Treatment: 0 Active Inactive ` Nutrition Nursing Diagnoses: Imbalanced nutrition Impaired glucose control: actual or potential Potential for alteratiion in Nutrition/Potential for imbalanced nutrition Goals: Patient/caregiver agrees to and verbalizes understanding of need to use nutritional supplements and/or vitamins as prescribed Date Initiated: 07/13/2017 Target  Resolution Date: 11/12/2017 Goal Status: Active Patient/caregiver will maintain therapeutic glucose control Date Initiated: 07/13/2017 Target Resolution Date: 11/12/2017 Goal Status: Active Interventions: Assess patient nutrition upon admission and as needed per policy Provide education on elevated blood sugars and impact on wound healing Provide education on nutrition Notes: ` Orientation to the Wound Care Program Nursing Diagnoses: Knowledge deficit related to the wound healing center program Goals: Patient/caregiver will verbalize understanding of the Wound Healing Center Program Date Initiated: 07/13/2017 Target Resolution Date: 08/13/2017 Goal Status: Active Interventions: Provide education on orientation to the wound center Notes: ` Pain, Acute or Chronic Nursing Diagnoses: DAMANY, EASTMAN ([id number]) Pain, acute or chronic: actual or potential Potential alteration in comfort, pain Goals: Patient/caregiver will verbalize adequate pain control between visits Date Initiated: 07/13/2017 Target Resolution Date: 11/12/2017 Goal Status: Active Interventions: Complete pain assessment as per visit requirements Encourage patient to take pain medications as prescribed Notes: ` Soft Tissue Infection Nursing Diagnoses: Impaired tissue integrity Knowledge deficit related to disease process and management Knowledge deficit related to home infection control: handwashing, handling of soiled dressings, supply storage Potential for infection: soft tissue Goals: Patient will remain free of wound infection Date Initiated: 07/13/2017 Target Resolution Date: 11/12/2017 Goal Status: Active Patient/caregiver will verbalize understanding of or measures to prevent infection and contamination in the home setting Date Initiated: 07/13/2017 Target Resolution Date: 11/12/2017 Goal Status: Active Interventions: Assess signs and symptoms of infection every visit Provide education on  infection Notes: ` Wound/Skin Impairment Nursing Diagnoses: Impaired tissue integrity Knowledge deficit related to ulceration/compromised skin integrity Goals: Ulcer/skin breakdown will have a volume reduction of 80% by week 12 Date Initiated: 07/13/2017 Target Resolution Date: 11/12/2017 Goal Status: Active Interventions: Assess patient/caregiver ability to perform ulcer/skin care regimen upon admission and as needed Assess ulceration(s) every visit BOLTON, CANUPP ([id number]) Notes: Electronic Signature(s) Signed: 07/13/2017 11:41:58 AM By: Alejandro Mulling Entered By: Alejandro Mulling on 07/13/2017 11:41:57 Ryan Leonard ([id number]) -------------------------------------------------------------------------------- Pain Assessment Details Patient Name: Ryan Leonard Date of Service: 07/13/2017 10:30 AM Medical Record Number: [id number] Patient Account Number: [id number] Date of Birth/Sex: May 05, 1976 (42 y.o. Male) Treating RN: Phillis Haggis Primary Care Lenette Rau: Naoma Diener Other  Clinician: Referring Chisom Muntean: Oren Beckmann Treating Zoe Goonan/Extender: Altamese [place] in Treatment: 0 Active Problems Location of Pain Severity and Description of Pain Patient Has Paino Yes Site Locations Pain Location: Pain in Ulcers Rate the pain. Current Pain Level: 6 Character of Pain Describe the Pain: Aching, Burning Pain Management and Medication Current Pain Management: Notes Topical or injectable lidocaine is offered to patient for acute pain when surgical debridement is performed. If needed, Patient is instructed to use over the counter pain medication for the following 24-48 hours after debridement. Wound care MDs do not prescribed pain medications. Patient has chronic pain or uncontrolled pain. Patient has been instructed to make an appointment with their Primary Care Physician for pain management. Electronic Signature(s) Signed: 07/14/2017 3:16:44 PM By: Alejandro Mulling Entered By: Alejandro Mulling on 07/13/2017 10:52:08 Ryan Leonard ([id number]) -------------------------------------------------------------------------------- Patient/Caregiver Education Details Patient Name: Ryan Leonard Date of Service: 07/13/2017 10:30 AM Medical Record Number: [id number] Patient Account Number: [id number] Date of Birth/Gender: 01/04/1976 (42 y.o. Male) Treating RN: Phillis Haggis Primary Care Physician: Naoma Diener Other Clinician: Referring Physician: Oren Beckmann Treating Physician/Extender: Altamese  in Treatment: 0 Education Assessment Education Provided To: Patient Education Topics Provided Elevated Blood Sugar/ Impact on Healing: Handouts: Elevated Blood Sugars: How Do They Affect Wound Healing Methods: Explain/Verbal Responses: State content correctly Infection: Handouts: Infection Prevention and Management Methods: Explain/Verbal Responses: State content correctly Nutrition: Handouts: Elevated Blood Sugars: How Do They Affect Wound Healing, Nutrition Methods: Explain/Verbal Responses: State content correctly Welcome To The Wound Care Center: Handouts: Welcome To The Wound Care Center Methods: Explain/Verbal Responses: State content correctly Wound/Skin Impairment: Handouts: Caring for Your Ulcer, Other: change dressing as ordered Methods: Demonstration, Explain/Verbal Responses: State content correctly Electronic Signature(s) Signed: 07/14/2017 3:16:44 PM By: Alejandro Mulling Entered By: Alejandro Mulling on 07/13/2017 11:47:15 Ryan Leonard ([id number]) -------------------------------------------------------------------------------- Wound Assessment Details Patient Name: Ryan Leonard Date of Service: 07/13/2017 10:30 AM Medical Record Number: [id number] Patient Account Number: [id number] Date of Birth/Sex: [birth year] (42 y.o. Male) Treating RN: Phillis Haggis Primary Care Hatsuko Bizzarro: Naoma Diener Other  Clinician: Referring Lamarco Gudiel: Oren Beckmann Treating Ulanda Tackett/Extender: Maxwell Caul Weeks in Treatment: 0 Wound Status Wound Number: 1 Primary Diabetic Wound/Ulcer of the Lower Extremity Etiology: Wound Location: Right Foot - Dorsal, Distal Wound Status: Open Wounding Event: Blister Comorbid Asthma, Hypertension, Type II Diabetes, Date Acquired: 07/09/2017 History: Neuropathy Weeks Of Treatment: 0 Clustered Wound: No Photos Photo Uploaded By: Alejandro Mulling on 07/13/2017 16:08:16 Wound Measurements Length: (cm) 4 Width: (cm) 4.5 Depth: (cm) 0.1 Area: (cm) 14.137 Volume: (cm) 1.414 % Reduction in Area: 0% % Reduction in Volume: 0% Epithelialization: None Tunneling: No Undermining: No Wound Description Classification: Grade 1 Wound Margin: Distinct, outline attached Exudate Amount: Large Exudate Type: Serous Exudate Color: amber Foul Odor After Cleansing: No Slough/Fibrino Yes Wound Bed Granulation Amount: None Present (0%) Exposed Structure Necrotic Amount: Large (67-100%) Fascia Exposed: No Necrotic Quality: Eschar Fat Layer (Subcutaneous Tissue) Exposed: No Tendon Exposed: No Muscle Exposed: No Joint Exposed: No Bone Exposed: No Periwound Skin Texture Brzozowski, Lason ([id number]) Texture Color No Abnormalities Noted: No No Abnormalities Noted: No Erythema: Yes Moisture Erythema Location: Circumferential No Abnormalities Noted: No Maceration: No Temperature / Pain Temperature: Hot Tenderness on Palpation: Yes Wound Preparation Ulcer Cleansing: Rinsed/Irrigated with Saline Topical Anesthetic Applied: Other: lidocaine 4%, Electronic Signature(s) Signed: 07/14/2017 3:16:44 PM By: Alejandro Mulling Entered By: Alejandro Mulling on 07/13/2017 11:25:26 Ryan Leonard ([id number]) -------------------------------------------------------------------------------- Wound Assessment Details Patient Name: Ryan Leonard Date of Service: 07/13/2017 10:30  AM Medical Record Number: [id number] Patient Account Number: [id number] Date of Birth/Sex: 02-18-1976 (42 y.o. Male) Treating RN: Ashok Cordia, Debi Primary Care Handy Mcloud: Naoma Diener Other Clinician: Referring Fredderick Swanger: Oren Beckmann Treating Raelin Pixler/Extender: Maxwell Caul Weeks in Treatment: 0 Wound Status Wound Number: 2 Primary Diabetic Wound/Ulcer of the Lower Etiology: Extremity Wound Location: Right Foot - Dorsal, Proximal Wound Status: Open Wounding Event: Gradually Appeared Comorbid Asthma, Hypertension, Type II Diabetes, Date Acquired: 06/29/2017 History: Neuropathy Weeks Of Treatment: 0 Clustered Wound: No Photos Photo Uploaded By: Alejandro Mulling on 07/13/2017 16:08:17 Wound Measurements Length: (cm) 1 Width: (cm) 0.9 Depth: (cm) 0.1 Area: (cm) 0.707 Volume: (cm) 0.071 % Reduction in Area: 0% % Reduction in Volume: 0% Epithelialization: None Tunneling: No Undermining: No Wound Description Classification: Grade 2 Wound Margin: Distinct, outline attached Exudate Amount: Large Exudate Type: Serous Exudate Color: amber Foul Odor After Cleansing: No Slough/Fibrino Yes Wound Bed Necrotic Amount: Large (67-100%) Exposed Structure Necrotic Quality: Eschar Fascia Exposed: No Fat Layer (Subcutaneous Tissue) Exposed: No Tendon Exposed: No Muscle Exposed: No Joint Exposed: No Bone Exposed: No Periwound Skin Texture Brooks, Dodd ([id number]) Texture Color No Abnormalities Noted: No No Abnormalities Noted: No Erythema: Yes Moisture Erythema Location: Circumferential No Abnormalities Noted: No Temperature / Pain Temperature: Hot Tenderness on Palpation: Yes Wound Preparation Ulcer Cleansing: Rinsed/Irrigated with Saline Topical Anesthetic Applied: Other: lidocaine 4%, Electronic Signature(s) Signed: 07/14/2017 3:16:44 PM By: Alejandro Mulling Entered By: Alejandro Mulling on 07/13/2017 11:25:38 Ryan Leonard  ([id number]) -------------------------------------------------------------------------------- Wound Assessment Details Patient Name: Ryan Leonard Date of Service: 07/13/2017 10:30 AM Medical Record Number: [id number] Patient Account Number: [id number] Date of Birth/Sex: 1976/01/09 (42 y.o. Male) Treating RN: Ashok Cordia, Debi Primary Care Diquan Kassis: Naoma Diener Other Clinician: Referring Stetson Pelaez: Oren Beckmann Treating Elliet Goodnow/Extender: Maxwell Caul Weeks in Treatment: 0 Wound Status Wound Number: 3 Primary Diabetic Wound/Ulcer of the Lower Etiology: Extremity Wound Location: Right Lower Leg Wound Status: Open Wounding Event: Gradually Appeared Comorbid Asthma, Hypertension, Type II Diabetes, Date Acquired: 06/29/2017 History: Neuropathy Weeks Of Treatment: 0 Clustered Wound: No Photos Photo Uploaded By: Alejandro Mulling on 07/13/2017 16:08:44 Wound Measurements Length: (cm) 1.7 Width: (cm) 2.2 Depth: (cm) 0.1 Area: (cm) 2.937 Volume: (cm) 0.294 % Reduction in Area: % Reduction in Volume: Epithelialization: None Tunneling: No Undermining: No Wound Description Classification: Grade 2 Wound Margin: Distinct, outline attached Exudate Amount: Large Exudate Type: Serous Exudate Color: amber Foul Odor After Cleansing: No Slough/Fibrino Yes Wound Bed Granulation Amount: None Present (0%) Exposed Structure Necrotic Amount: Large (67-100%) Fascia Exposed: No Necrotic Quality: Eschar Fat Layer (Subcutaneous Tissue) Exposed: No Tendon Exposed: No Muscle Exposed: No Joint Exposed: No Bone Exposed: No Periwound Skin Texture Wolfley, Jalen ([id number]) Texture Color No Abnormalities Noted: No No Abnormalities Noted: No Ecchymosis: No Moisture Erythema: Yes No Abnormalities Noted: No Erythema Location: Circumferential Temperature / Pain Temperature: Hot Tenderness on Palpation: Yes Wound Preparation Ulcer Cleansing: Rinsed/Irrigated with Saline Topical  Anesthetic Applied: Other: lidocaine 4%, Electronic Signature(s) Signed: 07/14/2017 3:16:44 PM By: Alejandro Mulling Entered By: Alejandro Mulling on 07/13/2017 11:25:13 Ryan Leonard ([id number]) -------------------------------------------------------------------------------- Wound Assessment Details Patient Name: Ryan Leonard Date of Service: 07/13/2017 10:30 AM Medical Record Number: [id number] Patient Account Number: [id number] Date of Birth/Sex: 1975/12/05 (42 y.o. Male) Treating RN: Phillis Haggis Primary Care Sebron Mcmahill: Naoma Diener Other Clinician: Referring Avory Mimbs: Oren Beckmann Treating Alyce Inscore/Extender: Maxwell Caul Weeks in Treatment: 0 Wound Status Wound Number: 4 Primary Trauma, Other Etiology: Wound Location: Right Hand - 2nd Digit Wound Status: Open Wounding Event: Trauma Comorbid Asthma, Hypertension,  Type II Diabetes, Date Acquired: 06/29/2017 History: Neuropathy Weeks Of Treatment: 0 Clustered Wound: No Photos Photo Uploaded By: Alejandro Mulling on 07/13/2017 16:08:45 Wound Measurements Length: (cm) 0.5 Width: (cm) 0.6 Depth: (cm) 0.1 Area: (cm) 0.236 Volume: (cm) 0.024 % Reduction in Area: % Reduction in Volume: Epithelialization: None Tunneling: No Undermining: No Wound Description Full Thickness Without Exposed Support Classification: Structures Wound Margin: Distinct, outline attached Exudate Large Amount: Exudate Type: Serous Exudate Color: amber Foul Odor After Cleansing: No Slough/Fibrino Yes Wound Bed Granulation Amount: None Present (0%) Exposed Structure Necrotic Amount: Large (67-100%) Fascia Exposed: No Necrotic Quality: Eschar Fat Layer (Subcutaneous Tissue) Exposed: No Tendon Exposed: No Muscle Exposed: No Joint Exposed: No Bone Exposed: No MAXIMUM, REILAND ([id number]) Periwound Skin Texture Texture Color No Abnormalities Noted: No No Abnormalities Noted: No Moisture Temperature / Pain No Abnormalities  Noted: No Temperature: No Abnormality Tenderness on Palpation: Yes Wound Preparation Ulcer Cleansing: Rinsed/Irrigated with Saline Topical Anesthetic Applied: Other: lidocaine 4%, Electronic Signature(s) Signed: 07/14/2017 3:16:44 PM By: Alejandro Mulling Entered By: Alejandro Mulling on 07/13/2017 11:29:55 Ryan Leonard ([id number]) -------------------------------------------------------------------------------- Wound Assessment Details Patient Name: Ryan Leonard Date of Service: 07/13/2017 10:30 AM Medical Record Number: [id number] Patient Account Number: [id number] Date of Birth/Sex: May 07, 1976 (42 y.o. Male) Treating RN: Ashok Cordia, Debi Primary Care Jacquelynne Guedes: Naoma Diener Other Clinician: Referring Mckinzi Eriksen: Oren Beckmann Treating Kellyn Mansfield/Extender: Maxwell Caul Weeks in Treatment: 0 Wound Status Wound Number: 5 Primary Trauma, Other Etiology: Wound Location: Right Hand - 3rd Digit Wound Status: Open Wounding Event: Trauma Comorbid Asthma, Hypertension, Type II Diabetes, Date Acquired: 06/29/2017 History: Neuropathy Weeks Of Treatment: 0 Clustered Wound: No Photos Photo Uploaded By: Alejandro Mulling on 07/13/2017 16:09:12 Wound Measurements Length: (cm) 2 Width: (cm) 1.8 Depth: (cm) 0.1 Area: (cm) 2.827 Volume: (cm) 0.283 % Reduction in Area: % Reduction in Volume: Epithelialization: None Tunneling: No Undermining: No Wound Description Full Thickness Without Exposed Support Classification: Structures Wound Margin: Distinct, outline attached Exudate Large Amount: Exudate Type: Serous Exudate Color: amber Foul Odor After Cleansing: No Slough/Fibrino Yes Wound Bed Granulation Amount: None Present (0%) Exposed Structure Necrotic Amount: Large (67-100%) Fascia Exposed: No Necrotic Quality: Eschar Fat Layer (Subcutaneous Tissue) Exposed: No Tendon Exposed: No Muscle Exposed: No Joint Exposed: No Bone Exposed: No CHACE, KLIPPEL  ([id number]) Periwound Skin Texture Texture Color No Abnormalities Noted: No No Abnormalities Noted: No Moisture Temperature / Pain No Abnormalities Noted: No Temperature: No Abnormality Tenderness on Palpation: Yes Wound Preparation Ulcer Cleansing: Rinsed/Irrigated with Saline Topical Anesthetic Applied: Other: lidocaine 4%, Electronic Signature(s) Signed: 07/14/2017 3:16:44 PM By: Alejandro Mulling Entered By: Alejandro Mulling on 07/13/2017 11:32:31 Ryan Leonard ([id number]) -------------------------------------------------------------------------------- Wound Assessment Details Patient Name: Ryan Leonard Date of Service: 07/13/2017 10:30 AM Medical Record Number: [id number] Patient Account Number: [id number] Date of Birth/Sex: 1975/07/22 (42 y.o. Male) Treating RN: Ashok Cordia, Debi Primary Care Addiel Mccardle: Naoma Diener Other Clinician: Referring Dontez Hauss: Oren Beckmann Treating Rashad Obeid/Extender: Maxwell Caul Weeks in Treatment: 0 Wound Status Wound Number: 6 Primary Trauma, Other Etiology: Wound Location: Right Hand - 4th Digit Wound Status: Open Wounding Event: Trauma Comorbid Asthma, Hypertension, Type II Diabetes, Date Acquired: 06/29/2017 History: Neuropathy Weeks Of Treatment: 0 Clustered Wound: No Photos Photo Uploaded By: Alejandro Mulling on 07/13/2017 16:09:12 Wound Measurements Length: (cm) 0.2 Width: (cm) 0.4 Depth: (cm) 0.1 Area: (cm) 0.063 Volume: (cm) 0.006 % Reduction in Area: % Reduction in Volume: Epithelialization: None Tunneling: No Undermining: No Wound Description Full Thickness Without Exposed Support Slou Classification: Structures Wound Margin: Distinct, outline attached Exudate Large  Amount: Exudate Type: Serous Exudate Color: amber gh/Fibrino Yes Wound Bed Granulation Amount: None Present (0%) Exposed Structure Necrotic Amount: Large (67-100%) Fascia Exposed: No Necrotic Quality: Eschar Fat Layer (Subcutaneous  Tissue) Exposed: No Tendon Exposed: No Muscle Exposed: No Joint Exposed: No Bone Exposed: No DIONTE, BLAUSTEIN ([id number]) Periwound Skin Texture Texture Color No Abnormalities Noted: No No Abnormalities Noted: No Moisture Temperature / Pain No Abnormalities Noted: No Temperature: No Abnormality Tenderness on Palpation: Yes Wound Preparation Ulcer Cleansing: Rinsed/Irrigated with Saline Topical Anesthetic Applied: Other: lidocaine 4%, Electronic Signature(s) Signed: 07/14/2017 3:16:44 PM By: Alejandro Mulling Entered By: Alejandro Mulling on 07/13/2017 11:33:39 Ryan Leonard ([id number]) -------------------------------------------------------------------------------- Vitals Details Patient Name: Ryan Leonard Date of Service: 07/13/2017 10:30 AM Medical Record Number: [id number] Patient Account Number: [id number] Date of Birth/Sex: Aug 18, 1975 (42 y.o. Male) Treating RN: Ashok Cordia, Debi Primary Care Briana Farner: Naoma Diener Other Clinician: Referring Alvey Brockel: Oren Beckmann Treating Evany Schecter/Extender: Maxwell Caul Weeks in Treatment: 0 Vital Signs Time Taken: 10:52 Temperature (F): 98.2 Height (in): 72 Pulse (bpm): 87 Source: Stated Respiratory Rate (breaths/min): 20 Weight (lbs): 226.5 Blood Pressure (mmHg): 167/96 Source: Measured Reference Range: 80 - 120 mg / dl Body Mass Index (BMI): 30.7 Notes Pt states that he has not taken his BP meds today. Electronic Signature(s) Signed: 07/13/2017 12:43:27 PM By: Alejandro Mulling Entered By: Alejandro Mulling on 07/13/2017 12:43:27

## 2017-07-20 ENCOUNTER — Encounter: Payer: Managed Care, Other (non HMO) | Admitting: Internal Medicine

## 2017-07-20 DIAGNOSIS — E11621 Type 2 diabetes mellitus with foot ulcer: Secondary | ICD-10-CM | POA: Diagnosis not present

## 2017-07-21 NOTE — Progress Notes (Signed)
Ryan Leonard, Ryan Leonard ([id number]) Visit Report for 07/20/2017 Arrival Information Details Patient Name: Ryan Leonard, Ryan Leonard Date of Service: 07/20/2017 2:15 PM Medical Record Number: [id number] Patient Account Number: [id number] Date of Birth/Sex: Aug 01, 1975 [age] y.o. Male) Treating RN: Curtis Sitesorthy, Joanna Primary Care Abigayle Wilinski: Ryan Leonard, Ryan Leonard Other Clinician: Referring Makynlie Rossini: Ryan Leonard, Ryan Leonard Treating Meisha Salone/Extender: Altamese CarolinaOBSON, Ryan Leonard Weeks in Treatment: 1 Visit Information History Since Last Visit Added or deleted any medications: No Patient Arrived: Ambulatory Any new allergies or adverse reactions: No Arrival Time: 14:53 Had a fall or experienced change in No Accompanied By: spouse activities of daily living that may affect Transfer Assistance: None risk of falls: Patient Identification Verified: Yes Signs or symptoms of abuse/neglect since last visito No Secondary Verification Process Completed: Yes Hospitalized since last visit: No Patient Requires Transmission-Based No Has Dressing in Place as Prescribed: Yes Precautions: Pain Present Now: Yes Patient Has Alerts: Yes Patient Alerts: DM II Electronic Signature(s) Signed: 07/20/2017 4:09:49 PM By: Curtis Sitesorthy, Joanna Entered By: Curtis Sitesorthy, Joanna on 07/20/2017 14:54:14 Ryan Leonard, Ryan Leonard ([id number]) -------------------------------------------------------------------------------- Encounter Discharge Information Details Patient Name: Ryan Leonard, Ryan Leonard Date of Service: 07/20/2017 2:15 PM Medical Record Number: [id number] Patient Account Number: [id number] Date of Birth/Sex: Aug 01, 1975 [age] y.o. Male) Treating RN: Curtis Sitesorthy, Joanna Primary Care [name]: Ryan Leonard, Ryan Leonard Other Clinician: Referring Ryan Leonard: Ryan Leonard, Ryan Leonard Treating Ryan Leonard/Extender: Altamese CarolinaOBSON, Ryan Leonard Weeks in Treatment: 1 Encounter Discharge Information Items Discharge Pain Level: 0 Discharge Condition: Stable Ambulatory Status: Ambulatory Discharge Destination:  Home Transportation: Private Auto Accompanied By: spouse Schedule Follow-up Appointment: Yes Medication Reconciliation completed and No provided to Patient/Care Drako Maese: Provided on Clinical Summary of Care: 07/20/2017 Form Type Recipient Paper Patient BW Electronic Signature(s) Signed: 07/20/2017 4:09:05 PM By: Curtis Sitesorthy, Joanna Previous Signature: 07/20/2017 4:05:31 PM Version By: Gwenlyn PerkingMoore, Shelia Entered By: Curtis Sitesorthy, Joanna on 07/20/2017 16:09:05 Ryan Leonard, [name] ([id number]) -------------------------------------------------------------------------------- Lower Extremity Assessment Details Patient Name: Ryan Leonard, Ryan Leonard Date of Service: 07/20/2017 2:15 PM Medical Record Number: [id number] Patient Account Number: [id number] Date of Birth/Sex: Aug 01, 1975 [age] y.o. Male) Treating RN: Curtis Sitesorthy, Joanna Primary Care Flannery Cavallero: Ryan Leonard, Ryan Leonard Other Clinician: Referring Ryan Leonard: Ryan Leonard, Ryan Leonard Treating Anntonette Madewell/Extender: Maxwell CaulOBSON, Ryan Leonard Weeks in Treatment: 1 Edema Assessment Assessed: [Left: No] [Right: No] [Left: Edema] [Right: :] Calf Left: Right: Point of Measurement: 36 cm From Medial Instep cm 40.1 cm Ankle Left: Right: Point of Measurement: 11 cm From Medial Instep cm 26.1 cm Vascular Assessment Pulses: Dorsalis Pedis Palpable: [Right:Yes] Posterior Tibial Extremity colors, hair growth, and conditions: Extremity Color: [Right:Normal] Hair Growth on Extremity: [Right:Yes] Temperature of Extremity: [Right:Warm] Capillary Refill: [Right:< 3 seconds] Electronic Signature(s) Signed: 07/20/2017 4:09:49 PM By: Curtis Sitesorthy, Joanna Entered By: Curtis Sitesorthy, Joanna on 07/20/2017 15:09:03 Ryan Leonard, Ryan Leonard ([id number]) -------------------------------------------------------------------------------- Multi Wound Chart Details Patient Name: Ryan Leonard, Ryan Leonard Date of Service: 07/20/2017 2:15 PM Medical Record Number: [id number] Patient Account Number: [id number] Date of Birth/Sex: Aug 01, 1975 [age] y.o.  Male) Treating RN: Curtis Sitesorthy, Joanna Primary Care Ryan Leonard: Ryan Leonard, Ryan Leonard Other Clinician: Referring Ryan Leonard: Ryan Leonard, Ryan Leonard Treating Ryan Leonard/Extender: Maxwell CaulOBSON, Ryan Leonard Weeks in Treatment: 1 Vital Signs Height(in): 72 Pulse(bpm): 87 Weight(lbs): 226.5 Blood Pressure(mmHg): 168/92 Body Mass Index(BMI): 31 Temperature(F): 98.3 Respiratory Rate 20 (breaths/min): Photos: [1:No Photos] [2:No Photos] [3:No Photos] Wound Location: [1:Right Foot - Dorsal, Distal] [2:Right Foot - Dorsal, Proximal] [3:Right Lower Leg] Wounding Event: [1:Blister] [2:Gradually Appeared] [3:Gradually Appeared] Primary Etiology: [1:Diabetic Wound/Ulcer of the Lower Extremity] [2:Diabetic Wound/Ulcer of the Lower Extremity] [3:Diabetic Wound/Ulcer of the Lower Extremity] Comorbid History: [1:Asthma, Hypertension, Type II Diabetes, Neuropathy] [2:Asthma, Hypertension, Type II Diabetes, Neuropathy] [3:N/A] Date Acquired: [1:07/09/2017] [2:06/29/2017] [3:06/29/2017] Weeks of Treatment: [1:1] [2:1] [  3:1] Wound Status: [1:Open] [2:Open] [3:Open] Pending Amputation on [1:Yes] [2:Yes] [3:Yes] Presentation: Measurements L x W x D [1:3.2x3.3x0.1] [2:1.1x0.8x0.1] [3:1.7x2.2x0.1] (cm) Area (cm) : [1:8.294] [2:0.691] [3:2.937] Volume (cm) : [1:0.829] [2:0.069] [3:0.294] % Reduction in Area: [1:41.30%] [2:2.30%] [3:0.00%] % Reduction in Volume: [1:41.40%] [2:2.80%] [3:0.00%] Classification: [1:Grade 1] [2:Grade 2] [3:Grade 2] Exudate Amount: [1:Large] [2:Large] [3:N/A] Exudate Type: [1:Serous] [2:Serous] [3:N/A] Exudate Color: [1:amber] [2:amber] [3:N/A] Wound Margin: [1:Distinct, outline attached] [2:Distinct, outline attached] [3:N/A] Granulation Amount: [1:Large (67-100%)] [2:Medium (34-66%)] [3:N/A] Granulation Quality: [1:Red] [2:Pink] [3:N/A] Necrotic Amount: [1:Small (1-33%)] [2:Medium (34-66%)] [3:N/A] Necrotic Tissue: [1:Adherent Slough] [2:Adherent Slough] [3:N/A] Exposed Structures: [1:Fascia: No Fat  Layer (Subcutaneous Tissue) Exposed: No Tendon: No Muscle: No Joint: No Bone: No] [2:Fascia: No Fat Layer (Subcutaneous Tissue) Exposed: No Tendon: No Muscle: No Joint: No Bone: No] [3:N/A] Epithelialization: [1:None] [2:None] [3:N/A] Debridement: [1:N/A N/A] [2:Debridement ([id number]) 15:29] [3:Debridement ([id number]) 15:31] Pre-procedure Verification/Time Out Taken: Pain Control: N/A Lidocaine 4% Topical Solution Lidocaine 4% Topical Solution Tissue Debrided: N/A Fibrin/Slough, Subcutaneous Fibrin/Slough, Subcutaneous Level: N/A Skin/Subcutaneous Tissue Skin/Subcutaneous Tissue Debridement Area (sq cm): N/A 0.88 3.74 Instrument: N/A Curette Curette Bleeding: N/A Minimum Minimum Hemostasis Achieved: N/A Pressure Pressure Procedural Pain: N/A 0 0 Post Procedural Pain: N/A 0 0 Debridement Treatment N/A Procedure was tolerated well Procedure was tolerated well Response: Post Debridement N/A 1.1x0.8x0.2 1.7x2.2x0.2 Measurements L x W x D (cm) Post Debridement Volume: N/A 0.138 0.587 (cm) Periwound Skin Texture: No Abnormalities Noted No Abnormalities Noted No Abnormalities Noted Periwound Skin Moisture: Maceration: No No Abnormalities Noted No Abnormalities Noted Periwound Skin Color: Erythema: Yes Erythema: Yes No Abnormalities Noted Erythema Location: Circumferential Circumferential N/A Temperature: Hot Hot N/A Tenderness on Palpation: Yes Yes No Wound Preparation: Ulcer Cleansing: Ulcer Cleansing: N/A Rinsed/Irrigated with Saline Rinsed/Irrigated with Saline Topical Anesthetic Applied: Topical Anesthetic Applied: Other: lidocaine 4% Other: lidocaine 4% Procedures Performed: N/A Debridement Debridement Wound Number: [id number]  Photos: No Photos No Photos No Photos Wound Location: Right Hand - 2nd Digit Right Hand - 3rd Digit Right Hand - 4th Digit Wounding Event: Trauma Trauma Trauma Primary Etiology: Trauma, Other Trauma, Other Trauma, Other Comorbid History: Asthma,  Hypertension, Type II Asthma, Hypertension, Type II Asthma, Hypertension, Type II Diabetes, Neuropathy Diabetes, Neuropathy Diabetes, Neuropathy Date Acquired: 06/29/2017 06/29/2017 06/29/2017 Weeks of Treatment: 1 1 1  Wound Status: Open Open Open Pending Amputation on Yes Yes Yes Presentation: Measurements L x W x D 0.4x0.4x0.1 1.8x1.6x0.1 0.1x0.1x0.1 (cm) Area (cm) : 0.126 2.262 0.008 Volume (cm) : 0.013 0.226 0.001 % Reduction in Area: 46.60% 20.00% 87.30% % Reduction in Volume: 45.80% 20.10% 83.30% Classification: Full Thickness Without Full Thickness Without Full Thickness Without Exposed Support Structures Exposed Support Structures Exposed Support Structures Exudate Amount: Large Large Large Exudate Type: Serous Serous Serous Exudate Color: Psychologist, forensic Wound Margin: Distinct, outline attached Distinct, outline attached Distinct, outline attached Granulation Amount: Medium (34-66%) None Present (0%) Large (67-100%) Granulation Quality: Red N/A Yosiel Thieme, Terrius ([id number]) Necrotic Amount: Medium (34-66%) Large (67-100%) None Present (0%) Necrotic Tissue: Eschar Eschar N/A Exposed Structures: Fascia: No Fascia: No Fascia: No Fat Layer (Subcutaneous Fat Layer (Subcutaneous Fat Layer (Subcutaneous Tissue) Exposed: No Tissue) Exposed: No Tissue) Exposed: No Tendon: No Tendon: No Tendon: No Muscle: No Muscle: No Muscle: No Joint: No Joint: No Joint: No Bone: No Bone: No Bone: No Epithelialization: None None None Debridement: N/A Debridement ([id number]) N/A Pre-procedure N/A 15:39 N/A Verification/Time Out Taken: Pain Control: N/A Lidocaine 4% Topical Solution N/A Tissue Debrided: N/A Necrotic/Eschar, N/A  Fibrin/Slough, Subcutaneous Level: N/A Skin/Subcutaneous Tissue N/A Debridement Area (sq cm): N/A 2.88 N/A Instrument: N/A Curette N/A Bleeding: N/A Minimum N/A Hemostasis Achieved: N/A Pressure N/A Procedural Pain: N/A 0 N/A Post Procedural  Pain: N/A 0 N/A Debridement Treatment N/A Procedure was tolerated well N/A Response: Post Debridement N/A 1.8x1.6x0.2 N/A Measurements L x W x D (cm) Post Debridement Volume: N/A 0.452 N/A (cm) Periwound Skin Texture: No Abnormalities Noted No Abnormalities Noted No Abnormalities Noted Periwound Skin Moisture: No Abnormalities Noted No Abnormalities Noted No Abnormalities Noted Periwound Skin Color: No Abnormalities Noted No Abnormalities Noted No Abnormalities Noted Erythema Location: N/A N/A N/A Temperature: No Abnormality No Abnormality No Abnormality Tenderness on Palpation: Yes Yes Yes Wound Preparation: Ulcer Cleansing: Ulcer Cleansing: Ulcer Cleansing: Rinsed/Irrigated with Saline Rinsed/Irrigated with Saline Rinsed/Irrigated with Saline Topical Anesthetic Applied: Topical Anesthetic Applied: Topical Anesthetic Applied: Other: lidocaine 4% Other: lidocaine 4% Other: lidocaine 4% Procedures Performed: N/A Debridement N/A Treatment Notes Wound #1 (Right, Distal, Dorsal Foot) 1. Cleansed with: Clean wound with Normal Saline 2. Anesthetic Topical Lidocaine 4% cream to wound bed prior to debridement 4. Dressing Applied: Other dressing (specify in notes) 5. Secondary Dressing Applied ABD Pad Kerlix/Conform 7. Secured with Tape [place], [facility] ([id number]) Notes silvercel, stretch netting Wound #2 (Right, Proximal, Dorsal Foot) 1. Cleansed with: Clean wound with Normal Saline 2. Anesthetic Topical Lidocaine 4% cream to wound bed prior to debridement 4. Dressing Applied: Other dressing (specify in notes) 5. Secondary Dressing Applied ABD Pad Kerlix/Conform 7. Secured with Tape Notes silvercel, stretch netting Wound #3 (Right Lower Leg) 1. Cleansed with: Clean wound with Normal Saline 2. Anesthetic Topical Lidocaine 4% cream to wound bed prior to debridement 4. Dressing Applied: Other dressing (specify in notes) 5. Secondary Dressing Applied ABD  Pad Kerlix/Conform 7. Secured with Tape Notes silvercel, stretch netting Wound #4 (Right Hand - 2nd Digit) 1. Cleansed with: Clean wound with Normal Saline 2. Anesthetic Topical Lidocaine 4% cream to wound bed prior to debridement 4. Dressing Applied: Other dressing (specify in notes) 5. Secondary Dressing Applied Dry Gauze Notes silvercel, coverlet Wound #5 (Right Hand - 3rd Digit) 1. Cleansed with: Clean wound with Normal Saline 2. Anesthetic Topical Lidocaine 4% cream to wound bed prior to debridement 4. Dressing Applied: STACE, PEACE ([id number]) Other dressing (specify in notes) 5. Secondary Dressing Applied Dry Gauze Notes silvercel, coverlet Wound #6 (Right Hand - 4th Digit) 1. Cleansed with: Clean wound with Normal Saline 2. Anesthetic Topical Lidocaine 4% cream to wound bed prior to debridement 4. Dressing Applied: Other dressing (specify in notes) 5. Secondary Dressing Applied Dry Gauze Notes silvercel, coverlet Electronic Signature(s) Signed: 07/20/2017 4:45:41 PM By: Baltazar Najjar MD Previous Signature: 07/20/2017 4:09:49 PM Version By: Curtis Sites Entered By: Baltazar Najjar on 07/20/2017 16:29:35 Ryan Church ([id number]) -------------------------------------------------------------------------------- Multi-Disciplinary Care Plan Details Patient Name: Ryan Church Date of Service: 07/20/2017 2:15 PM Medical Record Number: [id number] Patient Account Number: [id number] Date of Birth/Sex: 11/25/1975 (42 y.o. Male) Treating RN: Curtis Sites Primary Care Summerlynn Glauser: Ryan Diener Other Clinician: Referring Malcolm Quast: Ryan Diener Treating Hermes Wafer/Extender: Altamese [place] in Treatment: 1 Active Inactive ` Nutrition Nursing Diagnoses: Imbalanced nutrition Impaired glucose control: actual or potential Potential for alteratiion in Nutrition/Potential for imbalanced nutrition Goals: Patient/caregiver agrees to and verbalizes  understanding of need to use nutritional supplements and/or vitamins as prescribed Date Initiated: 07/13/2017 Target Resolution Date: 11/12/2017 Goal Status: Active Patient/caregiver will maintain therapeutic glucose control Date Initiated: 07/13/2017 Target Resolution Date: 11/12/2017 Goal Status: Active Interventions: Assess patient  nutrition upon admission and as needed per policy Provide education on elevated blood sugars and impact on wound healing Provide education on nutrition Treatment Activities: Education provided on Nutrition : 07/13/2017 Notes: ` Orientation to the Wound Care Program Nursing Diagnoses: Knowledge deficit related to the wound healing center program Goals: Patient/caregiver will verbalize understanding of the Wound Healing Center Program Date Initiated: 07/13/2017 Target Resolution Date: 08/13/2017 Goal Status: Active Interventions: Provide education on orientation to the wound center Notes: BRAYDEN, BRODHEAD ([id number]) Pain, Acute or Chronic Nursing Diagnoses: Pain, acute or chronic: actual or potential Potential alteration in comfort, pain Goals: Patient/caregiver will verbalize adequate pain control between visits Date Initiated: 07/13/2017 Target Resolution Date: 11/12/2017 Goal Status: Active Interventions: Complete pain assessment as per visit requirements Encourage patient to take pain medications as prescribed Notes: ` Soft Tissue Infection Nursing Diagnoses: Impaired tissue integrity Knowledge deficit related to disease process and management Knowledge deficit related to home infection control: handwashing, handling of soiled dressings, supply storage Potential for infection: soft tissue Goals: Patient will remain free of wound infection Date Initiated: 07/13/2017 Target Resolution Date: 11/12/2017 Goal Status: Active Patient/caregiver will verbalize understanding of or measures to prevent infection and contamination in the home setting Date  Initiated: 07/13/2017 Target Resolution Date: 11/12/2017 Goal Status: Active Interventions: Assess signs and symptoms of infection every visit Provide education on infection Treatment Activities: Education provided on Infection : 07/13/2017 Notes: ` Wound/Skin Impairment Nursing Diagnoses: Impaired tissue integrity Knowledge deficit related to ulceration/compromised skin integrity Goals: Ulcer/skin breakdown will have a volume reduction of 80% by week 12 Date Initiated: 07/13/2017 Target Resolution Date: 11/12/2017 BLADYN, TIPPS ([id number]) Goal Status: Active Interventions: Assess patient/caregiver ability to perform ulcer/skin care regimen upon admission and as needed Assess ulceration(s) every visit Notes: Electronic Signature(s) Signed: 07/20/2017 4:09:49 PM By: Curtis Sites Entered By: Curtis Sites on 07/20/2017 15:31:10 Ryan Church ([id number]) -------------------------------------------------------------------------------- Pain Assessment Details Patient Name: Ryan Church Date of Service: 07/20/2017 2:15 PM Medical Record Number: [id number] Patient Account Number: [id number] Date of Birth/Sex: 06-02-1976 (42 y.o. Male) Treating RN: Curtis Sites Primary Care Kalisa Girtman: Ryan Diener Other Clinician: Referring Dearia Wilmouth: Ryan Diener Treating Gennell How/Extender: Maxwell Caul Weeks in Treatment: 1 Active Problems Location of Pain Severity and Description of Pain Patient Has Paino Yes Site Locations Pain Location: Pain in Ulcers With Dressing Change: Yes Duration of the Pain. Constant / Intermittento Constant Pain Management and Medication Current Pain Management: Notes Topical or injectable lidocaine is offered to patient for acute pain when surgical debridement is performed. If needed, Patient is instructed to use over the counter pain medication for the following 24-48 hours after debridement. Wound care MDs do not prescribed pain  medications. Patient has chronic pain or uncontrolled pain. Patient has been instructed to make an appointment with their Primary Care Physician for pain management. Electronic Signature(s) Signed: 07/20/2017 4:09:49 PM By: Curtis Sites Entered By: Curtis Sites on 07/20/2017 14:54:30 Ryan Church ([id number]) -------------------------------------------------------------------------------- Patient/Caregiver Education Details Patient Name: Ryan Church Date of Service: 07/20/2017 2:15 PM Medical Record Number: [id number] Patient Account Number: [id number] Date of Birth/Gender: [birth year] (42 y.o. Male) Treating RN: Curtis Sites Primary Care Physician: Ryan Diener Other Clinician: Referring Physician: Naoma Diener Treating Physician/Extender: Altamese [place] in Treatment: 1 Education Assessment Education Provided To: Patient and Caregiver Education Topics Provided Wound/Skin Impairment: Handouts: Other: wound care a sordered Methods: Demonstration, Explain/Verbal Responses: State content correctly Electronic Signature(s) Signed: 07/20/2017 4:09:49 PM By: Curtis Sites Entered By: Curtis Sites on 07/20/2017 16:09:20 Ryan Church ([id number]) --------------------------------------------------------------------------------  Wound Assessment Details Patient Name: WARDEN, Ryan Leonard Date of Service: 07/20/2017 2:15 PM Medical Record Number: [id number] Patient Account Number: [id number] Date of Birth/Sex: 01-26-1976 (42 y.o. Male) Treating RN: Curtis Sites Primary Care Padraic Marinos: Ryan Diener Other Clinician: Referring Trevonne Nyland: Ryan Diener Treating Myrtle Barnhard/Extender: Maxwell Caul Weeks in Treatment: 1 Wound Status Wound Number: 1 Primary Diabetic Wound/Ulcer of the Lower Extremity Etiology: Wound Location: Right Foot - Dorsal, Distal Wound Status: Open Wounding Event: Blister Comorbid Asthma, Hypertension, Type II Diabetes, Date Acquired:  07/09/2017 History: Neuropathy Weeks Of Treatment: 1 Clustered Wound: No Pending Amputation On Presentation Photos Photo Uploaded By: Curtis Sites on 07/21/2017 12:00:06 Wound Measurements Length: (cm) 3.2 Width: (cm) 3.3 Depth: (cm) 0.1 Area: (cm) 8.294 Volume: (cm) 0.829 % Reduction in Area: 41.3% % Reduction in Volume: 41.4% Epithelialization: None Tunneling: No Undermining: No Wound Description Classification: Grade 1 Wound Margin: Distinct, outline attached Exudate Amount: Large Exudate Type: Serous Exudate Color: amber Foul Odor After Cleansing: No Slough/Fibrino Yes Wound Bed Granulation Amount: Large (67-100%) Exposed Structure Granulation Quality: Red Fascia Exposed: No Necrotic Amount: Small (1-33%) Fat Layer (Subcutaneous Tissue) Exposed: No Necrotic Quality: Adherent Slough Tendon Exposed: No Muscle Exposed: No Joint Exposed: No Bone Exposed: No Periwound Skin Texture Neuser, Deitrich ([id number]) Texture Color No Abnormalities Noted: No No Abnormalities Noted: No Erythema: Yes Moisture Erythema Location: Circumferential No Abnormalities Noted: No Maceration: No Temperature / Pain Temperature: Hot Tenderness on Palpation: Yes Wound Preparation Ulcer Cleansing: Rinsed/Irrigated with Saline Topical Anesthetic Applied: Other: lidocaine 4%, Treatment Notes Wound #1 (Right, Distal, Dorsal Foot) 1. Cleansed with: Clean wound with Normal Saline 2. Anesthetic Topical Lidocaine 4% cream to wound bed prior to debridement 4. Dressing Applied: Other dressing (specify in notes) 5. Secondary Dressing Applied ABD Pad Kerlix/Conform 7. Secured with Tape Notes silvercel, stretch netting Electronic Signature(s) Signed: 07/20/2017 4:09:49 PM By: Curtis Sites Entered By: Curtis Sites on 07/20/2017 15:05:54 Ryan Church ([id number]) -------------------------------------------------------------------------------- Wound Assessment Details Patient  Name: Ryan Church Date of Service: 07/20/2017 2:15 PM Medical Record Number: [id number] Patient Account Number: [id number] Date of Birth/Sex: 1976/05/02 (42 y.o. Male) Treating RN: Curtis Sites Primary Care Bowen Goyal: Ryan Diener Other Clinician: Referring Jaquay Morneault: Ryan Diener Treating Raiana Pharris/Extender: Maxwell Caul Weeks in Treatment: 1 Wound Status Wound Number: 2 Primary Diabetic Wound/Ulcer of the Lower Etiology: Extremity Wound Location: Right Foot - Dorsal, Proximal Wound Status: Open Wounding Event: Gradually Appeared Comorbid Asthma, Hypertension, Type II Diabetes, Date Acquired: 06/29/2017 History: Neuropathy Weeks Of Treatment: 1 Clustered Wound: No Pending Amputation On Presentation Photos Photo Uploaded By: Curtis Sites on 07/21/2017 12:00:06 Wound Measurements Length: (cm) 1.1 Width: (cm) 0.8 Depth: (cm) 0.1 Area: (cm) 0.691 Volume: (cm) 0.069 % Reduction in Area: 2.3% % Reduction in Volume: 2.8% Epithelialization: None Tunneling: No Undermining: No Wound Description Classification: Grade 2 Wound Margin: Distinct, outline attached Exudate Amount: Large Exudate Type: Serous Exudate Color: amber Foul Odor After Cleansing: No Slough/Fibrino Yes Wound Bed Granulation Amount: Medium (34-66%) Exposed Structure Granulation Quality: Pink Fascia Exposed: No Necrotic Amount: Medium (34-66%) Fat Layer (Subcutaneous Tissue) Exposed: No Necrotic Quality: Adherent Slough Tendon Exposed: No Muscle Exposed: No Joint Exposed: No Bone Exposed: No Periwound Skin Texture Sotelo, Pasquale ([id number]) Texture Color No Abnormalities Noted: No No Abnormalities Noted: No Erythema: Yes Moisture Erythema Location: Circumferential No Abnormalities Noted: No Temperature / Pain Temperature: Hot Tenderness on Palpation: Yes Wound Preparation Ulcer Cleansing: Rinsed/Irrigated with Saline Topical Anesthetic Applied: Other: lidocaine  4%, Treatment Notes Wound #2 (Right, Proximal, Dorsal Foot) 1. Cleansed with:  Clean wound with Normal Saline 2. Anesthetic Topical Lidocaine 4% cream to wound bed prior to debridement 4. Dressing Applied: Other dressing (specify in notes) 5. Secondary Dressing Applied ABD Pad Kerlix/Conform 7. Secured with Tape Notes silvercel, stretch netting Electronic Signature(s) Signed: 07/20/2017 4:09:49 PM By: Curtis Sites Entered By: Curtis Sites on 07/20/2017 15:06:45 Ryan Church ([id number]) -------------------------------------------------------------------------------- Wound Assessment Details Patient Name: Ryan Church Date of Service: 07/20/2017 2:15 PM Medical Record Number: [id number] Patient Account Number: [id number] Date of Birth/Sex: [birth year] (42 y.o. Male) Treating RN: Curtis Sites Primary Care Delailah Spieth: Ryan Diener Other Clinician: Referring Jennessy Sandridge: Ryan Diener Treating Chavez Rosol/Extender: Maxwell Caul Weeks in Treatment: 1 Wound Status Wound Number: 3 Primary Diabetic Wound/Ulcer of the Lower Etiology: Extremity Wound Location: Right Lower Leg Wound Status: Open Wounding Event: Gradually Appeared Date Acquired: 06/29/2017 Weeks Of Treatment: 1 Clustered Wound: No Pending Amputation On Presentation Photos Photo Uploaded By: Curtis Sites on 07/21/2017 12:00:29 Wound Measurements Length: (cm) 1.7 Width: (cm) 2.2 Depth: (cm) 0.1 Area: (cm) 2.937 Volume: (cm) 0.294 % Reduction in Area: 0% % Reduction in Volume: 0% Wound Description Classification: Grade 2 Periwound Skin Texture Texture Color No Abnormalities Noted: No No Abnormalities Noted: No Moisture No Abnormalities Noted: No Treatment Notes Wound #3 (Right Lower Leg) 1. Cleansed with: Clean wound with Normal Saline 2. Anesthetic Topical Lidocaine 4% cream to wound bed prior to debridement JLYN, BRACAMONTE ([id number]) 4. Dressing Applied: Other dressing (specify in  notes) 5. Secondary Dressing Applied ABD Pad Kerlix/Conform 7. Secured with Tape Notes silvercel, stretch netting Electronic Signature(s) Signed: 07/20/2017 4:09:49 PM By: Curtis Sites Entered By: Curtis Sites on 07/20/2017 15:05:34 Ryan Church ([id number]) -------------------------------------------------------------------------------- Wound Assessment Details Patient Name: Ryan Church Date of Service: 07/20/2017 2:15 PM Medical Record Number: [id number] Patient Account Number: [id number] Date of Birth/Sex: 04/03/1976 (42 y.o. Male) Treating RN: Curtis Sites Primary Care Monta Police: Ryan Diener Other Clinician: Referring Virgel Haro: Ryan Diener Treating Shakora Nordquist/Extender: Maxwell Caul Weeks in Treatment: 1 Wound Status Wound Number: 4 Primary Trauma, Other Etiology: Wound Location: Right Hand - 2nd Digit Wound Status: Open Wounding Event: Trauma Comorbid Asthma, Hypertension, Type II Diabetes, Date Acquired: 06/29/2017 History: Neuropathy Weeks Of Treatment: 1 Clustered Wound: No Pending Amputation On Presentation Photos Photo Uploaded By: Curtis Sites on 07/21/2017 12:00:29 Wound Measurements Length: (cm) 0.4 Width: (cm) 0.4 Depth: (cm) 0.1 Area: (cm) 0.126 Volume: (cm) 0.013 % Reduction in Area: 46.6% % Reduction in Volume: 45.8% Epithelialization: None Tunneling: No Undermining: No Wound Description Full Thickness Without Exposed Support Classification: Structures Wound Margin: Distinct, outline attached Exudate Large Amount: Exudate Type: Serous Exudate Color: amber Foul Odor After Cleansing: No Slough/Fibrino Yes Wound Bed Granulation Amount: Medium (34-66%) Exposed Structure Granulation Quality: Red Fascia Exposed: No Necrotic Amount: Medium (34-66%) Fat Layer (Subcutaneous Tissue) Exposed: No Necrotic Quality: Eschar Tendon Exposed: No Muscle Exposed: No Joint Exposed: No HUSTON, STONEHOCKER ([id number]) Bone Exposed:  No Periwound Skin Texture Texture Color No Abnormalities Noted: No No Abnormalities Noted: No Moisture Temperature / Pain No Abnormalities Noted: No Temperature: No Abnormality Tenderness on Palpation: Yes Wound Preparation Ulcer Cleansing: Rinsed/Irrigated with Saline Topical Anesthetic Applied: Other: lidocaine 4%, Treatment Notes Wound #4 (Right Hand - 2nd Digit) 1. Cleansed with: Clean wound with Normal Saline 2. Anesthetic Topical Lidocaine 4% cream to wound bed prior to debridement 4. Dressing Applied: Other dressing (specify in notes) 5. Secondary Dressing Applied Dry Gauze Notes silvercel, coverlet Electronic Signature(s) Signed: 07/20/2017 4:09:49 PM By: Curtis Sites Entered By: Curtis Sites on 07/20/2017 15:29:15 Ryan Church (  [id number]) -------------------------------------------------------------------------------- Wound Assessment Details Patient Name: Ryan Leonard, Ryan Leonard Date of Service: 07/20/2017 2:15 PM Medical Record Number: [id number] Patient Account Number: [id number] Date of Birth/Sex: Aug 12, 1975 (42 y.o. Male) Treating RN: Curtis Sites Primary Care Gara Kincade: Ryan Diener Other Clinician: Referring Rayshun Kandler: Ryan Diener Treating Deshawn Skelley/Extender: Maxwell Caul Weeks in Treatment: 1 Wound Status Wound Number: 5 Primary Trauma, Other Etiology: Wound Location: Right Hand - 3rd Digit Wound Status: Open Wounding Event: Trauma Comorbid Asthma, Hypertension, Type II Diabetes, Date Acquired: 06/29/2017 History: Neuropathy Weeks Of Treatment: 1 Clustered Wound: No Pending Amputation On Presentation Photos Photo Uploaded By: Curtis Sites on 07/21/2017 12:00:50 Wound Measurements Length: (cm) 1.8 Width: (cm) 1.6 Depth: (cm) 0.1 Area: (cm) 2.262 Volume: (cm) 0.226 % Reduction in Area: 20% % Reduction in Volume: 20.1% Epithelialization: None Tunneling: No Undermining: No Wound Description Full Thickness Without Exposed  Support Classification: Structures Wound Margin: Distinct, outline attached Exudate Large Amount: Exudate Type: Serous Exudate Color: amber Foul Odor After Cleansing: No Slough/Fibrino Yes Wound Bed Granulation Amount: None Present (0%) Exposed Structure Necrotic Amount: Large (67-100%) Fascia Exposed: No Necrotic Quality: Eschar Fat Layer (Subcutaneous Tissue) Exposed: No Tendon Exposed: No Muscle Exposed: No Joint Exposed: No ARJAY, JASKIEWICZ ([id number]) Bone Exposed: No Periwound Skin Texture Texture Color No Abnormalities Noted: No No Abnormalities Noted: No Moisture Temperature / Pain No Abnormalities Noted: No Temperature: No Abnormality Tenderness on Palpation: Yes Wound Preparation Ulcer Cleansing: Rinsed/Irrigated with Saline Topical Anesthetic Applied: Other: lidocaine 4%, Treatment Notes Wound #5 (Right Hand - 3rd Digit) 1. Cleansed with: Clean wound with Normal Saline 2. Anesthetic Topical Lidocaine 4% cream to wound bed prior to debridement 4. Dressing Applied: Other dressing (specify in notes) 5. Secondary Dressing Applied Dry Gauze Notes silvercel, coverlet Electronic Signature(s) Signed: 07/20/2017 4:09:49 PM By: Curtis Sites Entered By: Curtis Sites on 07/20/2017 15:29:26 Ryan Church ([id number]) -------------------------------------------------------------------------------- Wound Assessment Details Patient Name: Ryan Church Date of Service: 07/20/2017 2:15 PM Medical Record Number: [id number] Patient Account Number: [id number] Date of Birth/Sex: 04/18/1976 (42 y.o. Male) Treating RN: Curtis Sites Primary Care Kelena Garrow: Ryan Diener Other Clinician: Referring Maddex Garlitz: Ryan Diener Treating Franklin Baumbach/Extender: Maxwell Caul Weeks in Treatment: 1 Wound Status Wound Number: 6 Primary Trauma, Other Etiology: Wound Location: Right Hand - 4th Digit Wound Status: Open Wounding Event: Trauma Comorbid Asthma,  Hypertension, Type II Diabetes, Date Acquired: 06/29/2017 History: Neuropathy Weeks Of Treatment: 1 Clustered Wound: No Pending Amputation On Presentation Photos Photo Uploaded By: Curtis Sites on 07/21/2017 12:00:51 Wound Measurements Length: (cm) 0.1 Width: (cm) 0.1 Depth: (cm) 0.1 Area: (cm) 0.008 Volume: (cm) 0.001 % Reduction in Area: 87.3% % Reduction in Volume: 83.3% Epithelialization: None Tunneling: No Undermining: No Wound Description Full Thickness Without Exposed Support Sloug Classification: Structures Wound Margin: Distinct, outline attached Exudate Large Amount: Exudate Type: Serous Exudate Color: amber h/Fibrino Yes Wound Bed Granulation Amount: Large (67-100%) Exposed Structure Granulation Quality: Pink Fascia Exposed: No Necrotic Amount: None Present (0%) Fat Layer (Subcutaneous Tissue) Exposed: No Tendon Exposed: No Muscle Exposed: No Joint Exposed: No ALTA, SHOBER ([id number]) Bone Exposed: No Periwound Skin Texture Texture Color No Abnormalities Noted: No No Abnormalities Noted: No Moisture Temperature / Pain No Abnormalities Noted: No Temperature: No Abnormality Tenderness on Palpation: Yes Wound Preparation Ulcer Cleansing: Rinsed/Irrigated with Saline Topical Anesthetic Applied: Other: lidocaine 4%, Treatment Notes Wound #6 (Right Hand - 4th Digit) 1. Cleansed with: Clean wound with Normal Saline 2. Anesthetic Topical Lidocaine 4% cream to wound bed prior to debridement 4. Dressing Applied: Other dressing (specify in  notes) 5. Secondary Dressing Applied Dry Gauze Notes silvercel, coverlet Electronic Signature(s) Signed: 07/20/2017 4:09:49 PM By: Curtis Sites Entered By: Curtis Sites on 07/20/2017 15:29:52 Ryan Church ([id number]) -------------------------------------------------------------------------------- Vitals Details Patient Name: Ryan Church Date of Service: 07/20/2017 2:15 PM Medical Record  Number: [id number] Patient Account Number: [id number] Date of Birth/Sex: 09/29/1975 (42 y.o. Male) Treating RN: Curtis Sites Primary Care Kennett Symes: Ryan Diener Other Clinician: Referring Henryetta Corriveau: Ryan Diener Treating Jonny Dearden/Extender: Maxwell Caul Weeks in Treatment: 1 Vital Signs Time Taken: 14:56 Temperature (F): 98.3 Height (in): 72 Pulse (bpm): 87 Weight (lbs): 226.5 Respiratory Rate (breaths/min): 20 Body Mass Index (BMI): 30.7 Blood Pressure (mmHg): 168/92 Reference Range: 80 - 120 mg / dl Electronic Signature(s) Signed: 07/20/2017 4:09:49 PM By: Curtis Sites Entered By: Curtis Sites on 07/20/2017 14:59:06

## 2017-07-22 NOTE — Progress Notes (Signed)
ESHAWN, COOR ([id number]) Visit Report for 07/20/2017 Debridement Details Patient Name: Ryan Leonard, Ryan Date of Service: 07/20/2017 2:15 PM Medical Record Number: [id number] Patient Account Number: [id number] Date of Birth/Sex: September 24, 1975 (42 y.o. Male) Treating RN: Curtis Sites Primary Care Provider: Naoma Diener Other Clinician: Referring Provider: Naoma Diener Treating Provider/Extender: Maxwell Caul Weeks in Treatment: 1 Debridement Performed for Wound #2 Right,Proximal,Dorsal Foot Assessment: Performed By: Physician Maxwell Caul, MD Debridement: Debridement Severity of Tissue Pre Fat layer exposed Debridement: Pre-procedure Verification/Time Yes - 15:29 Out Taken: Start Time: 15:29 Pain Control: Lidocaine 4% Topical Solution Level: Skin/Subcutaneous Tissue Total Area Debrided (L x W): 1.1 (cm) x 0.8 (cm) = 0.88 (cm) Tissue and other material Viable, Non-Viable, Fibrin/Slough, Subcutaneous debrided: Instrument: Curette Bleeding: Minimum Hemostasis Achieved: Pressure End Time: 15:31 Procedural Pain: 0 Post Procedural Pain: 0 Response to Treatment: Procedure was tolerated well Post Debridement Measurements of Total Wound Length: (cm) 1.1 Width: (cm) 0.8 Depth: (cm) 0.2 Volume: (cm) 0.138 Character of Wound/Ulcer Post Debridement: Improved Severity of Tissue Post Debridement: Fat layer exposed Post Procedure Diagnosis Same as Pre-procedure Electronic Signature(s) Signed: 07/20/2017 4:45:41 PM By: Baltazar Najjar MD Signed: 07/21/2017 4:22:52 PM By: Curtis Sites Previous Signature: 07/20/2017 4:09:49 PM Version By: Curtis Sites Entered By: Baltazar Najjar on 07/20/2017 16:29:50 Ryan Leonard ([id number]) -------------------------------------------------------------------------------- Debridement Details Patient Name: Ryan Leonard Date of Service: 07/20/2017 2:15 PM Medical Record Number: [id number] Patient Account Number:  [id number] Date of Birth/Sex: [birth year] (42 y.o. Male) Treating RN: Curtis Sites Primary Care Provider: Naoma Diener Other Clinician: Referring Provider: Naoma Diener Treating Provider/Extender: Maxwell Caul Weeks in Treatment: 1 Debridement Performed for Wound #3 Right Lower Leg Assessment: Performed By: Physician Maxwell Caul, MD Debridement: Debridement Severity of Tissue Pre Fat layer exposed Debridement: Pre-procedure Verification/Time Yes - 15:31 Out Taken: Start Time: 15:31 Pain Control: Lidocaine 4% Topical Solution Level: Skin/Subcutaneous Tissue Total Area Debrided (L x W): 1.7 (cm) x 2.2 (cm) = 3.74 (cm) Tissue and other material Viable, Non-Viable, Fibrin/Slough, Subcutaneous debrided: Instrument: Curette Bleeding: Minimum Hemostasis Achieved: Pressure End Time: 15:34 Procedural Pain: 0 Post Procedural Pain: 0 Response to Treatment: Procedure was tolerated well Post Debridement Measurements of Total Wound Length: (cm) 1.7 Width: (cm) 2.2 Depth: (cm) 0.2 Volume: (cm) 0.587 Character of Wound/Ulcer Post Debridement: Improved Severity of Tissue Post Debridement: Fat layer exposed Post Procedure Diagnosis Same as Pre-procedure Electronic Signature(s) Signed: 07/20/2017 4:45:41 PM By: Baltazar Najjar MD Signed: 07/21/2017 4:22:52 PM By: Curtis Sites Previous Signature: 07/20/2017 4:09:49 PM Version By: Curtis Sites Entered By: Baltazar Najjar on 07/20/2017 16:30:04 Ryan Leonard ([id number]) -------------------------------------------------------------------------------- Debridement Details Patient Name: Ryan Leonard Date of Service: 07/20/2017 2:15 PM Medical Record Number: [id number] Patient Account Number: [id number] Date of Birth/Sex: Aug 17, 1975 (42 y.o. Male) Treating RN: Curtis Sites Primary Care Provider: Naoma Diener Other Clinician: Referring Provider: Naoma Diener Treating Provider/Extender: Maxwell Caul Weeks in Treatment: 1 Debridement Performed for Wound #5 Right Hand - 3rd Digit Assessment: Performed By: Physician Maxwell Caul, MD Debridement: Debridement Pre-procedure Verification/Time Yes - 15:39 Out Taken: Start Time: 15:39 Pain Control: Lidocaine 4% Topical Solution Level: Skin/Subcutaneous Tissue Total Area Debrided (L x W): 1.8 (cm) x 1.6 (cm) = 2.88 (cm) Tissue and other material Viable, Non-Viable, Eschar, Fibrin/Slough, Subcutaneous debrided: Instrument: Curette Bleeding: Minimum Hemostasis Achieved: Pressure End Time: 15:41 Procedural Pain: 0 Post Procedural Pain: 0 Response to Treatment: Procedure was tolerated well Post Debridement Measurements of Total Wound Length: (cm) 1.8 Width: (cm) 1.6 Depth: (cm) 0.2 Volume: (cm) 0.452  Character of Wound/Ulcer Post Debridement: Improved Post Procedure Diagnosis Same as Pre-procedure Electronic Signature(s) Signed: 07/20/2017 4:45:41 PM By: Baltazar Najjar MD Signed: 07/21/2017 4:22:52 PM By: Curtis Sites Previous Signature: 07/20/2017 4:09:49 PM Version By: Curtis Sites Entered By: Baltazar Najjar on 07/20/2017 16:30:29 Ryan Leonard ([id number]) -------------------------------------------------------------------------------- HPI Details Patient Name: Ryan Leonard Date of Service: 07/20/2017 2:15 PM Medical Record Number: [id number] Patient Account Number: [id number] Date of Birth/Sex: April 24, 1976 (42 y.o. Male) Treating RN: Curtis Sites Primary Care Provider: Naoma Diener Other Clinician: Referring Provider: Naoma Diener Treating Provider/Extender: Maxwell Caul Weeks in Treatment: 1 History of Present Illness HPI Description: 07/13/17; this is a 42 year old man who is a poorly controlled diabetic with a hemoglobin A1c of over 14. Recently transitioned from oral agents to the addition of long-acting basal insulin. For the last 2 weeks he has had problems with ulcerations on his right  foot this includes 2 shallow ulcerations one just distal to the ankle and a smaller one distal to this and a new recent blister just proximal to his toes. Last week his entire foot and lower ankle became intensely swollen and painful he was seen in the emergency room on 07/10/17 at the [facility] clinic in [facility] Diagnosed with a diabetic foot infection and put on Augmentin. This has helped somewhat with the swelling but he is still having a fair amount of pain and yesterday found it difficult to walk. He has not been systemically unwell. ABIs in this clinic were 0.88 and on the right 0.7-1 the left. He will definitely need formal arterial studies 07/20/17; my end of the day note on this patient from last week totally neglected to mention wounds over his right second third and fourth PIPs in his right hand which she obtained while trying to punch away ice. We've been using silver alginate to these areas as well. The area on the fourth PIP is healed. Second is much better. Third still covered by a large amount of adherent nonviable tissue oThe 3 wound areas from last week including the distal blister all look a lot better. Erythema and edema in the foot is better. Culture I did was negative x-ray of the foot I did was negative of the right foot and ankle ARTERIAL STUDIES showed an ABI on the right of 0.73 on the left hip 0.92. However TBIs were normal at 0.75 on the right and 0.73 on the left. Waveforms were triphasic bilaterally at the posterior and anterior tibial. The wounds will have enough blood flow for healing but I thought this should come to the attention of vascular surgeon especially with the reduced ABI on the right vis-o-vis his uncontrolled diabetes Electronic Signature(s) Signed: 07/20/2017 4:45:41 PM By: Baltazar Najjar MD Entered By: Baltazar Najjar on 07/20/2017 16:36:09 Ryan Leonard  ([id number]) -------------------------------------------------------------------------------- Physical Exam Details Patient Name: Ryan Leonard Date of Service: 07/20/2017 2:15 PM Medical Record Number: [id number] Patient Account Number: [id number] Date of Birth/Sex: 1976/04/12 (42 y.o. Male) Treating RN: Curtis Sites Primary Care Provider: Naoma Diener Other Clinician: Referring Provider: Naoma Diener Treating Provider/Extender: Maxwell Caul Weeks in Treatment: 1 Cardiovascular Pedal pulses were palpable in the right foot. Notes Exam; 3 areas on his right foot all look better. Debridement of the proximal 2 wounds done to remove necrotic surface debris. Hemostasis with direct pressure. The erythema and edema in his foot looks better oDebridement of the right third PIP also done to remove adherent necrotic surface debris. Electronic Signature(s) Signed: 07/20/2017 4:45:41 PM By: Baltazar Najjar  MD Entered By: Baltazar Najjar on 07/20/2017 16:37:24 Ryan Leonard ([id number]) -------------------------------------------------------------------------------- Physician Orders Details Patient Name: Ryan Leonard Date of Service: 07/20/2017 2:15 PM Medical Record Number: [id number] Patient Account Number: [id number] Date of Birth/Sex: 30-Nov-1975 (42 y.o. Male) Treating RN: Curtis Sites Primary Care Provider: Naoma Diener Other Clinician: Referring Provider: Naoma Diener Treating Provider/Extender: Altamese [place] in Treatment: 1 Verbal / Phone Orders: No Diagnosis Coding Wound Cleansing Wound #1 Right,Distal,Dorsal Foot o Clean wound with Normal Saline. o Cleanse wound with mild soap and water o May Shower, gently pat wound dry prior to applying new dressing. Wound #2 Right,Proximal,Dorsal Foot o Clean wound with Normal Saline. o Cleanse wound with mild soap and water o May Shower, gently pat wound dry prior to applying new dressing. Wound  #3 Right Lower Leg o Clean wound with Normal Saline. o Cleanse wound with mild soap and water o May Shower, gently pat wound dry prior to applying new dressing. Wound #4 Right Hand - 2nd Digit o Clean wound with Normal Saline. o Cleanse wound with mild soap and water o May Shower, gently pat wound dry prior to applying new dressing. Wound #5 Right Hand - 3rd Digit o Clean wound with Normal Saline. o Cleanse wound with mild soap and water o May Shower, gently pat wound dry prior to applying new dressing. Wound #6 Right Hand - 4th Digit o Clean wound with Normal Saline. o Cleanse wound with mild soap and water o May Shower, gently pat wound dry prior to applying new dressing. Anesthetic (add to Medication List) Wound #1 Right,Distal,Dorsal Foot o Topical Lidocaine 4% cream applied to wound bed prior to debridement (In Clinic Only). o Hurricaine Topical Anesthetic Spray applied to wound bed prior to debridement (In Clinic Only). Wound #2 Right,Proximal,Dorsal Foot o Topical Lidocaine 4% cream applied to wound bed prior to debridement (In Clinic Only). o Hurricaine Topical Anesthetic Spray applied to wound bed prior to debridement (In Clinic Only). Wound #3 Right Lower Leg o Topical Lidocaine 4% cream applied to wound bed prior to debridement (In Clinic Only). o Hurricaine Topical Anesthetic Spray applied to wound bed prior to debridement (In Clinic Only). Wound #4 Right Hand - 2nd Digit DAYVON, DAX ([id number]) o Topical Lidocaine 4% cream applied to wound bed prior to debridement (In Clinic Only). o Hurricaine Topical Anesthetic Spray applied to wound bed prior to debridement (In Clinic Only). Wound #5 Right Hand - 3rd Digit o Topical Lidocaine 4% cream applied to wound bed prior to debridement (In Clinic Only). o Hurricaine Topical Anesthetic Spray applied to wound bed prior to debridement (In Clinic Only). Wound #6 Right Hand - 4th  Digit o Topical Lidocaine 4% cream applied to wound bed prior to debridement (In Clinic Only). o Hurricaine Topical Anesthetic Spray applied to wound bed prior to debridement (In Clinic Only). Primary Wound Dressing Wound #1 Right,Distal,Dorsal Foot o [place] Non-Adherent Wound #2 Right,Proximal,Dorsal Foot o [place] Non-Adherent Wound #3 Right Lower Leg o [place] Non-Adherent Wound #4 Right Hand - 2nd Digit o [place] Non-Adherent Wound #5 Right Hand - 3rd Digit o [place] Non-Adherent Wound #6 Right Hand - 4th Digit o [place] Non-Adherent Secondary Dressing Wound #1 Right,Distal,Dorsal Foot o ABD pad o Conform/Kerlix o Other - tape, stretch netting #5 Wound #2 Right,Proximal,Dorsal Foot o ABD pad o Conform/Kerlix o Other - tape, stretch netting #5 Wound #3 Right Lower Leg o ABD pad o Conform/Kerlix o Other - tape, stretch netting #5 Wound #4 Right Hand - 2nd Digit   o Other - coverlet/band-aide Wound #5 Right Hand - 3rd Digit o Other - coverlet/band-aide Wound #6 Right Hand - 4th Digit o Other - coverlet/band-aide Dressing Change Frequency JILES, GOYA ([id number]) Wound #1 Right,Distal,Dorsal Foot o Change dressing every other day. o Other: - as needed Wound #2 Right,Proximal,Dorsal Foot o Change dressing every other day. o Other: - as needed Wound #3 Right Lower Leg o Change dressing every other day. o Other: - as needed Wound #4 Right Hand - 2nd Digit o Change dressing every other day. o Other: - as needed Wound #5 Right Hand - 3rd Digit o Change dressing every other day. o Other: - as needed Wound #6 Right Hand - 4th Digit o Change dressing every other day. o Other: - as needed Follow-up Appointments Wound #1 Right,Distal,Dorsal Foot o Return Appointment in 1 week. Wound #2 Right,Proximal,Dorsal Foot o Return Appointment in 1 week. Wound #3 Right Lower Leg o Return  Appointment in 1 week. Wound #4 Right Hand - 2nd Digit o Return Appointment in 1 week. Wound #5 Right Hand - 3rd Digit o Return Appointment in 1 week. Wound #6 Right Hand - 4th Digit o Return Appointment in 1 week. Edema Control Wound #1 Right,Distal,Dorsal Foot o Elevate legs to the level of the heart and pump ankles as often as possible Wound #2 Right,Proximal,Dorsal Foot o Elevate legs to the level of the heart and pump ankles as often as possible Wound #3 Right Lower Leg o Elevate legs to the level of the heart and pump ankles as often as possible Additional Orders / Instructions Wound #1 Right,Distal,Dorsal Foot o Increase protein intake. WAKE, CONLEE ([id number]) o Other: - Vitamin C, Zinc Wound #2 Right,Proximal,Dorsal Foot o Increase protein intake. o Other: - Vitamin C, Zinc Wound #3 Right Lower Leg o Increase protein intake. o Other: - Vitamin C, Zinc Wound #4 Right Hand - 2nd Digit o Increase protein intake. o Other: - Vitamin C, Zinc Wound #5 Right Hand - 3rd Digit o Increase protein intake. o Other: - Vitamin C, Zinc Wound #6 Right Hand - 4th Digit o Increase protein intake. o Other: - Vitamin C, Zinc Electronic Signature(s) Signed: 07/20/2017 4:09:49 PM By: Curtis Sites Signed: 07/20/2017 4:45:41 PM By: Baltazar Najjar MD Entered By: Curtis Sites on 07/20/2017 15:42:37 Ryan Leonard ([id number]) -------------------------------------------------------------------------------- Problem List Details Patient Name: Ryan Leonard Date of Service: 07/20/2017 2:15 PM Medical Record Number: [id number] Patient Account Number: [id number] Date of Birth/Sex: 1976/05/19 (42 y.o. 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 E11.51 Type 2 diabetes mellitus with diabetic peripheral angiopathy without 07/13/2017 Yes gangrene E11.42 Type 2 diabetes mellitus with diabetic polyneuropathy 07/13/2017 Yes S61.411D Laceration without foreign body of right hand, subsequent 07/20/2017 Yes encounter Inactive Problems Resolved Problems Electronic Signature(s) Signed: 07/20/2017 4:45:41 PM By: Baltazar Najjar MD Entered By: Baltazar Najjar on 07/20/2017 16:41:40 Ryan Leonard ([id number]) -------------------------------------------------------------------------------- Progress Note Details Patient Name: Ryan Leonard Date of Service: 07/20/2017 2:15 PM Medical Record Number: [id number] Patient Account Number: [id number] Date of Birth/Sex: [birth year] (42 y.o. Male) Treating RN: Curtis Sites Primary Care Provider: Naoma Diener Other Clinician: Referring Provider: Naoma Diener Treating Provider/Extender: Leanord Hawking  Terea Neubauer G Weeks in Treatment: 1 Subjective History of Present Illness (HPI) 07/13/17; this is a 42 year old man who is a poorly controlled diabetic with a hemoglobin A1c of over 14. Recently transitioned from oral agents to the addition of long-acting basal insulin. For the last 2 weeks he has had problems with ulcerations on his right foot this includes 2 shallow ulcerations one just distal to the ankle and a smaller one distal to this and a new recent blister just proximal to his toes. Last week his entire foot and lower ankle became intensely swollen and painful he was seen in the emergency room on 07/10/17 at the [facility] clinic in [facility]. Diagnosed with a diabetic foot infection and put on Augmentin. This has helped somewhat with the swelling but he is  still having a fair amount of pain and yesterday found it difficult to walk. He has not been systemically unwell. ABIs in this clinic were 0.88 and on the right 0.7-1 the left. He will definitely need formal arterial studies 07/20/17; my end of the day note on this patient from last week totally neglected to mention wounds over his right second third and fourth PIPs in his right hand which she obtained while trying to punch away ice. We've been using silver alginate to these areas as well. The area on the fourth PIP is healed. Second is much better. Third still covered by a large amount of adherent nonviable tissue The 3 wound areas from last week including the distal blister all look a lot better. Erythema and edema in the foot is better. Culture I did was negative x-ray of the foot I did was negative of the right foot and ankle ARTERIAL STUDIES showed an ABI on the right of 0.73 on the left hip 0.92. However TBIs were normal at 0.75 on the right and 0.73 on the left. Waveforms were triphasic bilaterally at the posterior and anterior tibial. The wounds will have enough blood flow for healing but I thought this should come to the attention of vascular surgeon especially with the reduced ABI on the right vis--vis his uncontrolled diabetes Objective Constitutional Vitals Time Taken: 2:56 PM, Height: 72 in, Weight: 226.5 lbs, BMI: 30.7, Temperature: 98.3 F, Pulse: 87 bpm, Respiratory Rate: 20 breaths/min, Blood Pressure: 168/92 mmHg. Cardiovascular Pedal pulses were palpable in the right foot. General Notes: Exam; 3 areas on his right foot all look better. Debridement of the proximal 2 wounds done to remove necrotic surface debris. Hemostasis with direct pressure. The erythema and edema in his foot looks better Debridement of the right third PIP also done to remove adherent necrotic surface debris. Integumentary (Hair, Skin) Wound #1 status is Open. Original cause of wound was Blister. The  wound is located on the Right,Distal,Dorsal Foot. The wound measures 3.2cm length x 3.3cm width x 0.1cm depth; 8.294cm^2 area and 0.829cm^3 volume. There is no tunneling or undermining noted. There is a large amount of serous drainage noted. The wound margin is distinct with the outline PACEN, WATFORD ([id number]) attached to the wound base. There is large (67-100%) red granulation within the wound bed. There is a small (1-33%) amount of necrotic tissue within the wound bed including Adherent Slough. The periwound skin appearance exhibited: Erythema. The periwound skin appearance did not exhibit: Maceration. The surrounding wound skin color is noted with erythema which is circumferential. Periwound temperature was noted as Hot. The periwound has tenderness on palpation. Wound #2 status is Open. Original cause of wound was Gradually Appeared.  The wound is located on the Right,Proximal,Dorsal Foot. The wound measures 1.1cm length x 0.8cm width x 0.1cm depth; 0.691cm^2 area and 0.069cm^3 volume. There is no tunneling or undermining noted. There is a large amount of serous drainage noted. The wound margin is distinct with the outline attached to the wound base. There is medium (34-66%) pink granulation within the wound bed. There is a medium (34-66%) amount of necrotic tissue within the wound bed including Adherent Slough. The periwound skin appearance exhibited: Erythema. The surrounding wound skin color is noted with erythema which is circumferential. Periwound temperature was noted as Hot. The periwound has tenderness on palpation. Wound #3 status is Open. Original cause of wound was Gradually Appeared. The wound is located on the Right Lower Leg. The wound measures 1.7cm length x 2.2cm width x 0.1cm depth; 2.937cm^2 area and 0.294cm^3 volume. Wound #4 status is Open. Original cause of wound was Trauma. The wound is located on the Right Hand - 2nd Digit. The wound measures 0.4cm length x 0.4cm width  x 0.1cm depth; 0.126cm^2 area and 0.013cm^3 volume. There is no tunneling or undermining noted. There is a large amount of serous drainage noted. The wound margin is distinct with the outline attached to the wound base. There is medium (34-66%) red granulation within the wound bed. There is a medium (34-66%) amount of necrotic tissue within the wound bed including Eschar. Periwound temperature was noted as No Abnormality. The periwound has tenderness on palpation. Wound #5 status is Open. Original cause of wound was Trauma. The wound is located on the Right Hand - 3rd Digit. The wound measures 1.8cm length x 1.6cm width x 0.1cm depth; 2.262cm^2 area and 0.226cm^3 volume. There is no tunneling or undermining noted. There is a large amount of serous drainage noted. The wound margin is distinct with the outline attached to the wound base. There is no granulation within the wound bed. There is a large (67-100%) amount of necrotic tissue within the wound bed including Eschar. Periwound temperature was noted as No Abnormality. The periwound has tenderness on palpation. Wound #6 status is Open. Original cause of wound was Trauma. The wound is located on the Right Hand - 4th Digit. The wound measures 0.1cm length x 0.1cm width x 0.1cm depth; 0.008cm^2 area and 0.001cm^3 volume. There is no tunneling or undermining noted. There is a large amount of serous drainage noted. The wound margin is distinct with the outline attached to the wound base. There is large (67-100%) pink granulation within the wound bed. There is no necrotic tissue within the wound bed. Periwound temperature was noted as No Abnormality. The periwound has tenderness on palpation. 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 There was a Skin/Subcutaneous Tissue Debridement ([id number]) debridement with total area of 0.88 sq cm performed by Maxwell Caul, MD. with the following instrument(s): Curette to remove Viable and Non-Viable tissue/material including Fibrin/Slough and Subcutaneous after achieving pain control using Lidocaine 4% Topical  Solution. A time out was conducted at 15:29, prior to the start of the procedure. A Minimum amount of bleeding was controlled with Pressure. The procedure was tolerated well with a pain level of 0 throughout and a pain level of 0 following the procedure. Post Debridement Measurements: 1.1cm length x 0.8cm width x 0.2cm depth; 0.138cm^3 volume. Character of Wound/Ulcer Post Debridement is improved. Severity of Tissue Post Debridement is: Fat layer exposed. Post procedure Diagnosis Wound #2: Same as Pre-Procedure Wound #3 Pre-procedure diagnosis of Wound #3 is a Diabetic Wound/Ulcer of the Lower Extremity located on the Right Lower Leg .Severity of Tissue Pre Debridement is: Fat layer exposed. There was a Skin/Subcutaneous Tissue Debridement (11042- 11047) debridement with total area of 3.74 sq cm performed by Maxwell CaulOBSON, Kayce Chismar G, MD. with the following instrument(s): Curette to remove Viable and Non-Viable tissue/material including Fibrin/Slough and Subcutaneous after achieving pain control using Lidocaine 4% Topical Solution. A time out was conducted at 15:31, prior to the start of the procedure. A Minimum amount of bleeding was controlled with Pressure. The  procedure was tolerated well with a pain level of 0 throughout and a pain level of 0 following the procedure. Post Debridement Measurements: 1.7cm length x 2.2cm width x 0.2cm depth; 0.587cm^3 volume. Character of Wound/Ulcer Post Debridement is improved. Severity of Tissue Post Debridement is: Fat layer exposed. Post procedure Diagnosis Wound #3: Same as Pre-Procedure Wound #5 Pre-procedure diagnosis of Wound #5 is a Trauma, Other located on the Right Hand - 3rd Digit . There was a Skin/Subcutaneous Tissue Debridement ([id number](11042-11047) debridement with total area of 2.88 sq cm performed by Maxwell CaulOBSON, Judith Campillo G, MD. with the following instrument(s): Curette to remove Viable and Non-Viable tissue/material including Fibrin/Slough, Eschar, and Subcutaneous after achieving pain control using Lidocaine 4% Topical Solution. A time out was conducted at 15:39, prior to the start of the procedure. A Minimum amount of bleeding was controlled with Pressure. The procedure was tolerated well with a pain level of 0 throughout and a pain level of 0 following the procedure. Post Debridement Measurements: 1.8cm length x 1.6cm width x 0.2cm depth; 0.452cm^3 volume. Character of Wound/Ulcer Post Debridement is improved. Post procedure Diagnosis Wound #5: Same as Pre-Procedure Plan Wound Cleansing: Wound #1 Right,Distal,Dorsal Foot: Clean wound with Normal Saline. Cleanse wound with mild soap and water May Shower, gently pat wound dry prior to applying new dressing. Wound #2 Right,Proximal,Dorsal Foot: Clean wound with Normal Saline. Cleanse wound with mild soap and water May Shower, gently pat wound dry prior to applying new dressing. Wound #3 Right Lower Leg: Clean wound with Normal Saline. Cleanse wound with mild soap and water May Shower, gently pat wound dry prior to applying new dressing. Wound #4 Right Hand - 2nd Digit: Ryan ChurchWILLIAMS, Tramon ([id number]) Clean wound with Normal Saline. Cleanse wound with mild  soap and water May Shower, gently pat wound dry prior to applying new dressing. Wound #5 Right Hand - 3rd Digit: Clean wound with Normal Saline. Cleanse wound with mild soap and water May Shower, gently pat wound dry prior to applying new dressing. Wound #6 Right Hand - 4th Digit: Clean wound with Normal Saline. Cleanse wound with mild soap and water May Shower, gently pat wound dry prior to applying new dressing. Anesthetic (add to Medication List): Wound #1 Right,Distal,Dorsal Foot: Topical Lidocaine 4% cream applied to wound bed prior to debridement (In Clinic Only). Hurricaine Topical Anesthetic Spray applied to wound bed prior to debridement (In Clinic Only). Wound #2 Right,Proximal,Dorsal Foot: Topical Lidocaine  4% cream applied to wound bed prior to debridement (In Clinic Only). Hurricaine Topical Anesthetic Spray applied to wound bed prior to debridement (In Clinic Only). Wound #3 Right Lower Leg: Topical Lidocaine 4% cream applied to wound bed prior to debridement (In Clinic Only). Hurricaine Topical Anesthetic Spray applied to wound bed prior to debridement (In Clinic Only). Wound #4 Right Hand - 2nd Digit: Topical Lidocaine 4% cream applied to wound bed prior to debridement (In Clinic Only). Hurricaine Topical Anesthetic Spray applied to wound bed prior to debridement (In Clinic Only). Wound #5 Right Hand - 3rd Digit: Topical Lidocaine 4% cream applied to wound bed prior to debridement (In Clinic Only). Hurricaine Topical Anesthetic Spray applied to wound bed prior to debridement (In Clinic Only). Wound #6 Right Hand - 4th Digit: Topical Lidocaine 4% cream applied to wound bed prior to debridement (In Clinic Only). Hurricaine Topical Anesthetic Spray applied to wound bed prior to debridement (In Clinic Only). Primary Wound Dressing: Wound #1 Right,Distal,Dorsal Foot: Silvercel Non-Adherent Wound #2 Right,Proximal,Dorsal Foot: [place] Non-Adherent Wound #3 Right Lower  Leg: [place] Non-Adherent Wound #4 Right Hand - 2nd Digit: Silvercel Non-Adherent Wound #5 Right Hand - 3rd Digit: Silvercel Non-Adherent Wound #6 Right Hand - 4th Digit: Silvercel Non-Adherent Secondary Dressing: Wound #1 Right,Distal,Dorsal Foot: ABD pad Conform/Kerlix Other - tape, stretch netting #5 Wound #2 Right,Proximal,Dorsal Foot: ABD pad Conform/Kerlix Other - tape, stretch netting #5 Wound #3 Right Lower Leg: ABD pad Conform/Kerlix Other - tape, stretch netting #5 Wound #4 Right Hand - 2nd Digit: Other - coverlet/band-aide Wound #5 Right Hand - 3rd Digit: Other - coverlet/band-aide MAYFIELD, SCHOENE ([id number]) Wound #6 Right Hand - 4th Digit: Other - coverlet/band-aide Dressing Change Frequency: Wound #1 Right,Distal,Dorsal Foot: Change dressing every other day. Other: - as needed Wound #2 Right,Proximal,Dorsal Foot: Change dressing every other day. Other: - as needed Wound #3 Right Lower Leg: Change dressing every other day. Other: - as needed Wound #4 Right Hand - 2nd Digit: Change dressing every other day. Other: - as needed Wound #5 Right Hand - 3rd Digit: Change dressing every other day. Other: - as needed Wound #6 Right Hand - 4th Digit: Change dressing every other day. Other: - as needed Follow-up Appointments: Wound #1 Right,Distal,Dorsal Foot: Return Appointment in 1 week. Wound #2 Right,Proximal,Dorsal Foot: Return Appointment in 1 week. Wound #3 Right Lower Leg: Return Appointment in 1 week. Wound #4 Right Hand - 2nd Digit: Return Appointment in 1 week. Wound #5 Right Hand - 3rd Digit: Return Appointment in 1 week. Wound #6 Right Hand - 4th Digit: Return Appointment in 1 week. Edema Control: Wound #1 Right,Distal,Dorsal Foot: Elevate legs to the level of the heart and pump ankles as often as possible Wound #2 Right,Proximal,Dorsal Foot: Elevate legs to the level of the heart and pump ankles as often as possible Wound #3 Right  Lower Leg: Elevate legs to the level of the heart and pump ankles as often as possible Additional Orders / Instructions: Wound #1 Right,Distal,Dorsal Foot: Increase protein intake. Other: - Vitamin C, Zinc Wound #2 Right,Proximal,Dorsal Foot: Increase protein intake. Other: - Vitamin C, Zinc Wound #3 Right Lower Leg: Increase protein intake. Other: - Vitamin C, Zinc Wound #4 Right Hand - 2nd Digit: Increase protein intake. Other: - Vitamin C, Zinc Wound #5 Right Hand - 3rd Digit: Increase protein intake. Other: - Vitamin C, Zinc Wound #6 Right Hand - 4th Digit: Increase protein intake. Other: - Marin Olp, Zinc YAFET, CLINE ([id number]) o #1 patient has  a much better appearing right foot. There is less erythema less swelling. He has completed antibiotics for his diabetic foot infection #2 x-rays were negative for underlying soft tissue or bony abnormalities #3 formal arterial studies have confirmed a low ABI on the right at 0.7 to although his TBIs were normal and his waveforms were triphasic I felt it was prudent to have him seen by vascular surgery given his uncontrolled diabetes #4 patient tells me he is concentrating on keeping his blood sugars under control exercising. States that his CBGs have been better and his primary physician took him off insulin he remains on glipizide and metformin. #5 I take him an out of work for another 2 weeks to make sure this foot has time to heal. #6 still using Silver alginate to all wound areas however if further debridement is required next week I may need to change this 7 to see dermatology wrt to the numberous dark macules and paules on his anterior shins and to lesser extent on his arms Electronic Signature(s) Signed: 07/20/2017 4:45:41 PM By: Baltazar Najjar MD Previous Signature: 07/20/2017 4:42:39 PM Version By: Baltazar Najjar MD Entered By: Baltazar Najjar on 07/20/2017 16:44:06 Ryan Leonard  ([id number]) -------------------------------------------------------------------------------- SuperBill Details Patient Name: Ryan Leonard Date of Service: 07/20/2017 Medical Record Number: [id number] Patient Account Number: [id number] Date of Birth/Sex: 07/24/1975 (42 y.o. Male) Treating RN: Curtis Sites Primary Care Provider: Naoma Diener Other Clinician: Referring Provider: Naoma Diener Treating Provider/Extender: Maxwell Caul Weeks in Treatment: 1 Diagnosis Coding ICD-10 Codes Code Description E11.621 Type 2 diabetes mellitus with foot ulcer L03.115 Cellulitis of right lower limb L97.511 Non-pressure chronic ulcer of other part of right foot limited to breakdown of skin E11.51 Type 2 diabetes mellitus with diabetic peripheral angiopathy without gangrene E11.42 Type 2 diabetes mellitus with diabetic polyneuropathy S61.411D Laceration without foreign body of right hand, subsequent encounter Facility Procedures CPT4 Code Description: 95284132 11042 - DEB SUBQ TISSUE 20 SQ CM/< ICD-10 Diagnosis Description L97.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

## 2017-07-26 ENCOUNTER — Other Ambulatory Visit (INDEPENDENT_AMBULATORY_CARE_PROVIDER_SITE_OTHER): Payer: Self-pay | Admitting: Vascular Surgery

## 2017-07-26 ENCOUNTER — Encounter (INDEPENDENT_AMBULATORY_CARE_PROVIDER_SITE_OTHER): Payer: Self-pay | Admitting: Vascular Surgery

## 2017-07-26 ENCOUNTER — Ambulatory Visit (INDEPENDENT_AMBULATORY_CARE_PROVIDER_SITE_OTHER): Payer: Managed Care, Other (non HMO) | Admitting: Vascular Surgery

## 2017-07-26 ENCOUNTER — Encounter (INDEPENDENT_AMBULATORY_CARE_PROVIDER_SITE_OTHER): Payer: Self-pay

## 2017-07-26 VITALS — BP 183/95 | HR 78 | Resp 17 | Ht 72.0 in | Wt 224.0 lb

## 2017-07-26 DIAGNOSIS — I7025 Atherosclerosis of native arteries of other extremities with ulceration: Secondary | ICD-10-CM | POA: Diagnosis not present

## 2017-07-26 DIAGNOSIS — E785 Hyperlipidemia, unspecified: Secondary | ICD-10-CM | POA: Diagnosis not present

## 2017-07-26 DIAGNOSIS — I739 Peripheral vascular disease, unspecified: Secondary | ICD-10-CM | POA: Diagnosis not present

## 2017-07-26 DIAGNOSIS — E1159 Type 2 diabetes mellitus with other circulatory complications: Secondary | ICD-10-CM | POA: Diagnosis not present

## 2017-07-26 NOTE — Progress Notes (Signed)
Subjective:    Patient ID: Ryan Leonard, male    DOB: August 01, 1975, 42 y.o.   MRN: [id number] Chief Complaint  Patient presents with  . New Patient (Initial Visit)   Patient presents to review vascular studies.  The patient underwent a bilateral ABI on July 15, 2017 ordered by Dr. Leanord Hawking from the wound center.  The patient endorses about a 26-month history of 3 slow healing wounds to the right calf.  The patient has been receiving local wound care from Dr. Leanord Hawking consisting of debridement and silver impregnated gauze dressings.  The patient denies any surgery or trauma to the right lower extremity that may have caused these wounds.  The patient denies any lower extremity edema that may have contributed to wound formation.  The patient notes experiencing claudication to the thigh and right calf during activity.  The patient would note experiencing calf claudication while he would play basketball.  Once he would stop a specific activity the discomfort would resolve.  The patient denies any rest pain.  The patient underwent a bilateral ABI which was notable for no significant left lower extremity arterial disease.  Moderate right lower extremity arterial disease.  The patient also notes experiencing some erectile dysfunction.  The patient denies any fever, nausea or vomiting.   Review of Systems  Constitutional: Negative.   HENT: Negative.   Eyes: Negative.   Respiratory: Negative.   Cardiovascular:       Right lower extremity claudication  Gastrointestinal: Negative.   Endocrine: Negative.   Genitourinary: Negative.   Musculoskeletal: Negative.   Skin: Positive for wound.  Allergic/Immunologic: Negative.   Neurological: Negative.   Hematological: Negative.   Psychiatric/Behavioral: Negative.       Objective:   Physical Exam  Constitutional: He appears well-developed and well-nourished. No distress.  HENT:  Head: Normocephalic and atraumatic.  Eyes: Conjunctivae are normal. Pupils  are equal, round, and reactive to light.  Neck: Normal range of motion.  Cardiovascular: Normal rate, regular rhythm, normal heart sounds and intact distal pulses.  Pulses:      Radial pulses are 2+ on the right side, and 2+ on the left side.       Dorsalis pedis pulses are 2+ on the right side, and 2+ on the left side.       Posterior tibial pulses are 2+ on the right side, and 2+ on the left side.  Pulmonary/Chest: Effort normal and breath sounds normal.  Musculoskeletal: Normal range of motion. He exhibits no edema.  Skin: He is not diaphoretic.  Right lower extremity: 3 ulcerations covered with the wound center dressings.  There is no drainage noted to the dressings.  There is no surrounding erythema.  There is no cellulitis.  Psychiatric: He has a normal mood and affect. His behavior is normal. Judgment and thought content normal.  Vitals reviewed.  BP (!) 183/95 (BP Location: Left Arm)   Pulse 78   Resp 17   Ht 6' (1.829 m)   Wt 224 lb (101.6 kg)   BMI 30.38 kg/m   Past Medical History:  Diagnosis Date  . Diabetes mellitus without complication (HCC)   . Hyperlipidemia   . Hypertension    Social History   Socioeconomic History  . Marital status: Married    Spouse name: Not on file  . Number of children: Not on file  . Years of education: Not on file  . Highest education level: Not on file  Social Needs  . Financial resource strain:  Not on file  . Food insecurity - worry: Not on file  . Food insecurity - inability: Not on file  . Transportation needs - medical: Not on file  . Transportation needs - non-medical: Not on file  Occupational History  . Not on file  Tobacco Use  . Smoking status: Never Smoker  . Smokeless tobacco: Never Used  Substance and Sexual Activity  . Alcohol use: No    Frequency: Never  . Drug use: No  . Sexual activity: Not on file  Other Topics Concern  . Not on file  Social History Narrative  . Not on file   Past Surgical History:    Procedure Laterality Date  . EYE SURGERY    . WISDOM TOOTH EXTRACTION     Family History  Problem Relation Age of Onset  . Diabetes Maternal Grandmother   . Colon cancer Maternal Grandfather    Allergies  Allergen Reactions  . Codeine Nausea Only      Assessment & Plan:  Patient presents to review vascular studies.  The patient underwent a bilateral ABI on July 15, 2017 ordered by Dr. Leanord Hawkingobson from the wound center.  The patient endorses about a 3157-month history of 3 slow healing wounds to the right calf.  The patient has been receiving local wound care from Dr. Leanord Hawkingobson consisting of debridement and silver impregnated gauze dressings.  The patient denies any surgery or trauma to the right lower extremity that may have caused these wounds.  The patient denies any lower extremity edema that may have contributed to wound formation.  The patient notes experiencing claudication to the thigh and right calf during activity.  The patient would note experiencing calf claudication while he would play basketball.  Once he would stop a specific activity the discomfort would resolve.  The patient denies any rest pain.  The patient underwent a bilateral ABI which was notable for no significant left lower extremity arterial disease.  Moderate right lower extremity arterial disease.  The patient also notes experiencing some erectile dysfunction.  The patient denies any fever, nausea or vomiting.  1. PAD (peripheral artery disease) (HCC) - New Patient with multiple risk factors for peripheral artery disease Patient with 3 slow healing wounds located to the right lower extremity.  These wounds have been receiving local wound care from the wound center Recent ABI with moderate right lower extremity arterial disease Patient experiences claudication-like symptoms to the thigh and calf with activity Recommend a right lower extremity angiogram with intervention and attempt to assess the patient's anatomy and degree  of peripheral artery disease. If appropriate an attempt to revascularize the leg can take place at that time Procedure, risks and benefits explained to the patient All questions answered The patient wishes to proceed  2. Atherosclerosis of native arteries of the extremities with ulceration (HCC) - New As above  3. Hyperlipidemia, unspecified hyperlipidemia type - Stable Encouraged good control as its slows the progression of atherosclerotic disease  4. Type 2 diabetes mellitus with other circulatory complication, unspecified whether long term insulin use (HCC) - Stable Encouraged good control as its slows the progression of atherosclerotic disease  Current Outpatient Medications on File Prior to Visit  Medication Sig Dispense Refill  . atorvastatin (LIPITOR) 20 MG tablet TAKE 1 TABLET (20 MG TOTAL) BY MOUTH ONCE DAILY.  11  . Cholecalciferol (VITAMIN D) 2000 units tablet Take by mouth.    Marland Kitchen. FLUoxetine (PROZAC) 10 MG tablet Take 10 mg by mouth daily.  3  .  glipiZIDE (GLUCOTROL) 10 MG tablet TAKE 1 TABLET (10 MG TOTAL) BY MOUTH 2 (TWO) TIMES DAILY BEFORE MEALS.  3  . hydrochlorothiazide (HYDRODIURIL) 12.5 MG tablet hydrochlorothiazide 12.5 mg tablet    . lisinopril (PRINIVIL,ZESTRIL) 10 MG tablet Take 10 mg by mouth.    . metFORMIN (GLUCOPHAGE) 500 MG tablet Take 500 mg by mouth.     No current facility-administered medications on file prior to visit.    There are no Patient Instructions on file for this visit. No Follow-up on file.  Hendryx Ricke A Tenessa Marsee, PA-C

## 2017-07-27 ENCOUNTER — Other Ambulatory Visit: Payer: Self-pay

## 2017-07-27 ENCOUNTER — Encounter
Admission: RE | Admit: 2017-07-27 | Discharge: 2017-07-27 | Disposition: A | Discharge status: Home / Self Care | Payer: Managed Care, Other (non HMO) | Source: Ambulatory Visit | Attending: Vascular Surgery | Admitting: Vascular Surgery

## 2017-07-27 ENCOUNTER — Encounter: Payer: Managed Care, Other (non HMO) | Admitting: Internal Medicine

## 2017-07-27 DIAGNOSIS — Z9889 Other specified postprocedural states: Secondary | ICD-10-CM | POA: Diagnosis not present

## 2017-07-27 DIAGNOSIS — Z800 Family history of malignant neoplasm of digestive organs: Secondary | ICD-10-CM | POA: Diagnosis not present

## 2017-07-27 DIAGNOSIS — E785 Hyperlipidemia, unspecified: Secondary | ICD-10-CM | POA: Diagnosis not present

## 2017-07-27 DIAGNOSIS — Z885 Allergy status to narcotic agent status: Secondary | ICD-10-CM | POA: Diagnosis not present

## 2017-07-27 DIAGNOSIS — Z79899 Other long term (current) drug therapy: Secondary | ICD-10-CM | POA: Diagnosis not present

## 2017-07-27 DIAGNOSIS — E1159 Type 2 diabetes mellitus with other circulatory complications: Secondary | ICD-10-CM | POA: Diagnosis not present

## 2017-07-27 DIAGNOSIS — E11621 Type 2 diabetes mellitus with foot ulcer: Secondary | ICD-10-CM | POA: Diagnosis not present

## 2017-07-27 DIAGNOSIS — Z7984 Long term (current) use of oral hypoglycemic drugs: Secondary | ICD-10-CM | POA: Diagnosis not present

## 2017-07-27 DIAGNOSIS — I70232 Atherosclerosis of native arteries of right leg with ulceration of calf: Secondary | ICD-10-CM | POA: Diagnosis not present

## 2017-07-27 DIAGNOSIS — L97211 Non-pressure chronic ulcer of right calf limited to breakdown of skin: Secondary | ICD-10-CM | POA: Diagnosis not present

## 2017-07-27 DIAGNOSIS — Z833 Family history of diabetes mellitus: Secondary | ICD-10-CM | POA: Diagnosis not present

## 2017-07-27 DIAGNOSIS — I10 Essential (primary) hypertension: Secondary | ICD-10-CM | POA: Diagnosis not present

## 2017-07-27 HISTORY — DX: Peripheral vascular disease, unspecified: I73.9

## 2017-07-27 HISTORY — DX: Depression, unspecified: F32.A

## 2017-07-27 HISTORY — DX: Major depressive disorder, single episode, unspecified: F32.9

## 2017-07-27 LAB — CREATININE, SERUM
Creatinine, Ser: 1.09 mg/dL (ref 0.61–1.24)
GFR calc non Af Amer: >60 mL/min (ref >60)

## 2017-07-27 LAB — BUN: BUN: 23 mg/dL — AB (ref 6–20)

## 2017-07-27 MED ORDER — CEFAZOLIN SODIUM-DEXTROSE 2-4 GM/100ML-% IV SOLN
2.0000 g | Freq: Once | INTRAVENOUS | Status: AC
  Administered 2017-07-28: 2 g via INTRAVENOUS

## 2017-07-27 NOTE — Pre-Procedure Instructions (Signed)
Call[facility] and called Dr Wyn Quakerew in Vascular lab regarding elevated BP.  Instructed by Dr Wyn Quakerew to send home unless symptomatic.  Patient instructed to notify primary care provider and see if BP meds could be adjusted.

## 2017-07-28 ENCOUNTER — Encounter
Admission: RE | Disposition: A | Discharge status: Home / Self Care | Payer: Self-pay | Source: Ambulatory Visit | Attending: Vascular Surgery

## 2017-07-28 ENCOUNTER — Ambulatory Visit
Admission: RE | Admit: 2017-07-28 | Discharge: 2017-07-28 | Disposition: A | Discharge status: Home / Self Care | Payer: Managed Care, Other (non HMO) | Source: Ambulatory Visit | Attending: Vascular Surgery | Admitting: Vascular Surgery

## 2017-07-28 DIAGNOSIS — Z833 Family history of diabetes mellitus: Secondary | ICD-10-CM | POA: Insufficient documentation

## 2017-07-28 DIAGNOSIS — L97909 Non-pressure chronic ulcer of unspecified part of unspecified lower leg with unspecified severity: Secondary | ICD-10-CM

## 2017-07-28 DIAGNOSIS — Z9889 Other specified postprocedural states: Secondary | ICD-10-CM | POA: Insufficient documentation

## 2017-07-28 DIAGNOSIS — I70238 Atherosclerosis of native arteries of right leg with ulceration of other part of lower right leg: Secondary | ICD-10-CM | POA: Diagnosis not present

## 2017-07-28 DIAGNOSIS — I10 Essential (primary) hypertension: Secondary | ICD-10-CM | POA: Insufficient documentation

## 2017-07-28 DIAGNOSIS — Z800 Family history of malignant neoplasm of digestive organs: Secondary | ICD-10-CM | POA: Insufficient documentation

## 2017-07-28 DIAGNOSIS — E785 Hyperlipidemia, unspecified: Secondary | ICD-10-CM | POA: Insufficient documentation

## 2017-07-28 DIAGNOSIS — I70232 Atherosclerosis of native arteries of right leg with ulceration of calf: Secondary | ICD-10-CM | POA: Diagnosis not present

## 2017-07-28 DIAGNOSIS — L97211 Non-pressure chronic ulcer of right calf limited to breakdown of skin: Secondary | ICD-10-CM | POA: Insufficient documentation

## 2017-07-28 DIAGNOSIS — Z885 Allergy status to narcotic agent status: Secondary | ICD-10-CM | POA: Insufficient documentation

## 2017-07-28 DIAGNOSIS — E11621 Type 2 diabetes mellitus with foot ulcer: Secondary | ICD-10-CM | POA: Insufficient documentation

## 2017-07-28 DIAGNOSIS — E1159 Type 2 diabetes mellitus with other circulatory complications: Secondary | ICD-10-CM | POA: Insufficient documentation

## 2017-07-28 DIAGNOSIS — Z79899 Other long term (current) drug therapy: Secondary | ICD-10-CM | POA: Insufficient documentation

## 2017-07-28 DIAGNOSIS — Z7984 Long term (current) use of oral hypoglycemic drugs: Secondary | ICD-10-CM | POA: Insufficient documentation

## 2017-07-28 DIAGNOSIS — I70299 Other atherosclerosis of native arteries of extremities, unspecified extremity: Secondary | ICD-10-CM

## 2017-07-28 HISTORY — PX: LOWER EXTREMITY ANGIOGRAPHY: CATH118251

## 2017-07-28 LAB — GLUCOSE, CAPILLARY
GLUCOSE-CAPILLARY: 165 mg/dL — AB (ref 65–99)
Glucose-Capillary: 151 mg/dL — ABNORMAL HIGH (ref 65–99)

## 2017-07-28 SURGERY — LOWER EXTREMITY ANGIOGRAPHY
Anesthesia: Moderate Sedation | Laterality: Right

## 2017-07-28 MED ORDER — MIDAZOLAM HCL 2 MG/2ML IJ SOLN
INTRAMUSCULAR | Status: AC
  Filled 2017-07-28: qty 4

## 2017-07-28 MED ORDER — MIDAZOLAM HCL 2 MG/2ML IJ SOLN
INTRAMUSCULAR | Status: DC | PRN
  Administered 2017-07-28: 2 mg
  Administered 2017-07-28 (×2): 2 mg via INTRAVENOUS

## 2017-07-28 MED ORDER — LIDOCAINE-EPINEPHRINE (PF) 1 %-1:200000 IJ SOLN
INTRAMUSCULAR | Status: DC | PRN
  Administered 2017-07-28: 4 mL

## 2017-07-28 MED ORDER — ASPIRIN EC 81 MG PO TBEC: 81.0000 mg | DELAYED_RELEASE_TABLET | Freq: Every day | ORAL | 2 refills | Status: AC

## 2017-07-28 MED ORDER — HEPARIN SODIUM (PORCINE) 1000 UNIT/ML IJ SOLN
INTRAMUSCULAR | Status: DC | PRN
  Administered 2017-07-28: 4000 [IU] via INTRAVENOUS

## 2017-07-28 MED ORDER — FENTANYL CITRATE (PF) 100 MCG/2ML IJ SOLN
INTRAMUSCULAR | Status: AC
  Filled 2017-07-28: qty 2

## 2017-07-28 MED ORDER — ASPIRIN EC 81 MG PO TBEC
81.0000 mg | DELAYED_RELEASE_TABLET | Freq: Every day | ORAL | Status: DC
  Administered 2017-07-28: 81 mg via ORAL
  Filled 2017-07-28 (×2): qty 1

## 2017-07-28 MED ORDER — MIDAZOLAM HCL 2 MG/2ML IJ SOLN
INTRAMUSCULAR | Status: AC
  Filled 2017-07-28: qty 2

## 2017-07-28 MED ORDER — METHYLPREDNISOLONE SODIUM SUCC 125 MG IJ SOLR: 125.0000 mg | INTRAMUSCULAR | Status: DC | PRN

## 2017-07-28 MED ORDER — FAMOTIDINE 20 MG PO TABS: 40.0000 mg | ORAL_TABLET | ORAL | Status: DC | PRN

## 2017-07-28 MED ORDER — HYDROMORPHONE HCL 1 MG/ML IJ SOLN: 1.0000 mg | Freq: Once | INTRAMUSCULAR | Status: DC | PRN

## 2017-07-28 MED ORDER — SODIUM CHLORIDE 0.9 % IV SOLN: 250.0000 mL | INTRAVENOUS | Status: DC | PRN

## 2017-07-28 MED ORDER — SODIUM CHLORIDE 0.9% FLUSH: 3.0000 mL | Freq: Two times a day (BID) | INTRAVENOUS | Status: DC

## 2017-07-28 MED ORDER — SODIUM CHLORIDE 0.9% FLUSH: 3.0000 mL | INTRAVENOUS | Status: DC | PRN

## 2017-07-28 MED ORDER — HYDRALAZINE HCL 20 MG/ML IJ SOLN: 5.0000 mg | INTRAMUSCULAR | Status: DC | PRN

## 2017-07-28 MED ORDER — CLOPIDOGREL BISULFATE 75 MG PO TABS: 75.0000 mg | ORAL_TABLET | Freq: Every day | ORAL | 11 refills | Status: DC

## 2017-07-28 MED ORDER — IOPAMIDOL (ISOVUE-300) INJECTION 61%
INTRAVENOUS | Status: DC | PRN
  Administered 2017-07-28: 70 mL via INTRAVENOUS

## 2017-07-28 MED ORDER — CLOPIDOGREL BISULFATE 75 MG PO TABS: 75.0000 mg | ORAL_TABLET | Freq: Every day | ORAL | Status: DC

## 2017-07-28 MED ORDER — SODIUM CHLORIDE 0.9 % IV SOLN: INTRAVENOUS | Status: DC

## 2017-07-28 MED ORDER — FENTANYL CITRATE (PF) 100 MCG/2ML IJ SOLN
INTRAMUSCULAR | Status: DC | PRN
  Administered 2017-07-28: 50 ug
  Administered 2017-07-28 (×2): 50 ug via INTRAVENOUS

## 2017-07-28 MED ORDER — LIDOCAINE-EPINEPHRINE (PF) 1 %-1:200000 IJ SOLN
INTRAMUSCULAR | Status: AC
  Filled 2017-07-28: qty 30

## 2017-07-28 MED ORDER — LABETALOL HCL 5 MG/ML IV SOLN: 10.0000 mg | INTRAVENOUS | Status: DC | PRN

## 2017-07-28 MED ORDER — SODIUM CHLORIDE 0.9 % IV SOLN
INTRAVENOUS | Status: DC
  Administered 2017-07-28: 09:00:00 via INTRAVENOUS

## 2017-07-28 MED ORDER — ONDANSETRON HCL 4 MG/2ML IJ SOLN: 4.0000 mg | Freq: Four times a day (QID) | INTRAMUSCULAR | Status: DC | PRN

## 2017-07-28 SURGICAL SUPPLY — 22 items
BALLN ARMADA 35LL 6X200X135 (BALLOONS) ×3 IMPLANT
BALLN LUTONIX 6X220X130 (BALLOONS) ×6
BALLN ULTRVRSE 3X300X150 (BALLOONS) ×2
BALLN ULTRVRSE 3X300X150 OTW (BALLOONS) ×1
BALLOON LUTONIX 6X220X130 (BALLOONS) ×2 IMPLANT
BALLOON ULTRVRSE 3X300X150 OTW (BALLOONS) ×1 IMPLANT
CATH BEACON 5 .035 65 KMP TIP (CATHETERS) ×3 IMPLANT
CATH BEACON 5 .038 100 VERT TP (CATHETERS) ×3 IMPLANT
CATH CXI SUPP ANG 4FR 135 (MICROCATHETER) ×1 IMPLANT
CATH CXI SUPP ANG 4FR 135CM (MICROCATHETER) ×3
CATH PIG 70CM (CATHETERS) ×3 IMPLANT
DEVICE PRESTO INFLATION (MISCELLANEOUS) ×3 IMPLANT
DEVICE STARCLOSE SE CLOSURE (Vascular Products) ×3 IMPLANT
DEVICE TORQUE (MISCELLANEOUS) ×3 IMPLANT
GLIDEWIRE ADV .035X260CM (WIRE) ×3 IMPLANT
LIFESTENT SOLO 7X200X135 (Permanent Stent) ×3 IMPLANT
PACK ANGIOGRAPHY (CUSTOM PROCEDURE TRAY) ×3 IMPLANT
SHEATH ANL2 6FRX45 HC (SHEATH) ×3 IMPLANT
SHEATH BRITE TIP 5FRX11 (SHEATH) ×3 IMPLANT
TUBING CONTRAST HIGH PRESS 72 (TUBING) ×3 IMPLANT
WIRE G V18X300CM (WIRE) ×3 IMPLANT
WIRE J 3MM .035X145CM (WIRE) ×3 IMPLANT

## 2017-07-28 NOTE — Op Note (Signed)
[facility] Percutaneous Study/Intervention Procedural Note   Date of Surgery: 07/28/2017  Surgeon(s):DEW,JASON   Assistants:none  Pre-operative Diagnosis: PAD with ulceration RLE  Post-operative diagnosis: Same  Procedure(s) Performed: 1. Ultrasound guidance for vascular access left femoral artery 2. Catheter placement into right anterior tibial artery from left femoral approach 3. Aortogram and selective right lower extremity angiogram 4. Percutaneous transluminal angioplasty of right anterior tibial artery with 3 mm diameter by 30 cm length angioplasty balloon 5. Percutaneous transluminal angioplasty of the right superficial femoral artery with two 6 mm diameter by 22 cm length Lutonix drug-coated angioplasty balloons  6.  Stent placement to the proximal superficial femoral artery with 7 mm diameter by 20 cm length life stent 7. StarClose closure device left femoral artery  EBL: 10 cc  Contrast: 70 cc  Fluoro Time: 8.2 minutes  Moderate Conscious Sedation Time: approximately 45 minutes using 2 mg of Versed and 50 mcg of Fentanyl  Indications: Patient is a 42 y.o.male with non-healing ulcers on the right leg. The patient has noninvasive study showing reduced right ABI. The patient is brought in for angiography for further evaluation and potential treatment.  Due to the limb threatening nature of the situation, angiogram was performed for attempted limb salvage. Risks and benefits are discussed and informed consent is obtained  Procedure: The patient was identified and appropriate procedural time out was performed. The patient was then placed supine on the table and prepped and draped in the usual sterile fashion.Moderate conscious sedation was administered during a face to face encounter with the patient throughout the procedure with my supervision of the RN  administering medicines and monitoring the patient's vital signs, pulse oximetry, telemetry and mental status throughout from the start of the procedure until the patient was taken to the recovery room. Ultrasound was used to evaluate the left common femoral artery. It was patent . A digital ultrasound image was acquired. A Seldinger needle was used to access the left common femoral artery under direct ultrasound guidance and a permanent image was performed. A 0.035 J wire was advanced without resistance and a 5Fr sheath was placed. Pigtail catheter was placed into the aorta and an AP aortogram was performed. This demonstrated normal renal arteries and normal aorta and iliac segments without significant stenosis. I then crossed the aortic bifurcation and advanced to the right femoral head. Selective right lower extremity angiogram was then performed. This demonstrated normal common femoral artery and normal fundal femoris artery but there was a flush occlusion of the right superficial femoral artery which reconstituted in the mid superficial femoral artery distally.  The more distal superficial femoral artery had 80-90% stenosis and then normalized at Hunter's canal and below.  The peroneal artery was the only continuous vessel initially.  The anterior tibial artery was occluded in the mid and distal segments but reconstituted at the ankle.  The posterior tibial artery was occluded proximally but also reconstituted at the ankle. The patient was systemically heparinized and a 6 [place] [employer] sheath was then placed over the [employer] wire. I then used a Kumpe catheter and the advantage wire to get into the superficial femoral artery occlusion across the occlusion without difficulty with the Kumpe catheter and the advantage wire.  Intraluminal flow was confirmed in the popliteal artery.  I then exchanged for a 0.018 anterior tibial artery occlusion with the help of a CXI catheter.  The wire was then parked  in the foot.  I then proceeded with treatment.  A 3 mm diameter by 30 cm length angioplasty balloon is inflated from the proximal dorsalis pedis artery up through the mid and distal anterior tibial arteries.  This was inflated to 12 atm for 1 minute.  I then turned my attention to the superficial femoral artery.  I used a 6 mm diameter by 22 cm length Lutonix drug-coated angioplasty balloon and inflated from Hunter's canal up to the mid superficial femoral artery.  A second 6 mm diameter by 22 cm length Lutonix drug-coated angioplasty balloon was used for the proximal to mid SFA up to the common femoral artery.  Each of these inflations was 10-12 atm for 1 minute.  Completion angiogram showed only about a 20% residual stenosis in the anterior tibial artery and the distal superficial femoral artery had a less than 20% residual stenosis as well.  There was a dissection about 4-5 cm into the superficial femoral artery with a >50% stenosis and another area in the mid superficial femoral artery with about a 70% residual stenosis.  I elected to cover these areas with a stent.  A 7 mm diameter centimeters length self-expanding stent was then selected and deployed starting 1 to 2 cm from the origin of the superficial femoral artery down into the mid superficial femoral artery to encompass both lesions.  This was postdilated with a 6 mm balloon with excellent angiographic completion result and less than 10% residual stenosis. I elected to terminate the procedure. The sheath was removed and StarClose closure device was deployed in the left femoral artery with excellent hemostatic result. The patient was taken to the recovery room in stable condition having tolerated the procedure well.  Findings:  Aortogram:Normal renal arteries, normal aorta and iliac arteries without significant stenosis Right lower Extremity:Normal common femoral artery and normal fundal femoris artery but there was a flush  occlusion of the right superficial femoral artery which reconstituted in the mid superficial femoral artery distally.  The more distal superficial femoral artery had 80-90% stenosis and then normalized at Hunter's canal and below.  The peroneal artery was the only continuous vessel initially.  The anterior tibial artery was occluded in the mid and distal segments but reconstituted at the ankle.  The posterior tibial artery was occluded proximally but also reconstituted at the ankle.   Disposition: Patient was taken to the recovery room in stable condition having tolerated the procedure well.  Complications: None  Leotis Pain 07/28/2017 10:25 AM   This note was created with Dragon Medical transcription system. Any errors in dictation are purely unintentional.

## 2017-07-28 NOTE — Progress Notes (Signed)
Dr. Wyn Quakerew at bedside , speaking with pt. And spouse re: procedural results. Both verbalized understanding.

## 2017-07-28 NOTE — Progress Notes (Signed)
EMMAUEL, HALLUMS ([id number]) Visit Report for 07/27/2017 HPI Details Patient Name: Ryan Leonard, Ryan Leonard Date of Service: 07/27/2017 9:00 AM Medical Record Number: [id number] Patient Account Number: [id number] Date of Birth/Sex: Apr 02, 1976 (42 y.o. Male) Treating RN: Huel Coventry Primary Care Provider: Naoma Diener Other Clinician: Referring Provider: Naoma Diener Treating Provider/Extender: Maxwell Caul Weeks in Treatment: 2 History of Present Illness HPI Description: 07/13/17; this is a 42 year old man who is a poorly controlled diabetic with a hemoglobin A1c of over 14. Recently transitioned from oral agents to the addition of long-acting basal insulin. For the last 2 weeks he has had problems with ulcerations on his right foot this includes 2 shallow ulcerations one just distal to the ankle and a smaller one distal to this and a new recent blister just proximal to his toes. Last week his entire foot and lower ankle became intensely swollen and painful he was seen in the emergency room on 07/10/17 at the [facility] clinic in [facility]. Diagnosed with a diabetic foot infection and put on Augmentin. This has helped somewhat with the swelling but he is still having a fair amount of pain and yesterday found it difficult to walk. He has not been systemically unwell. ABIs in this clinic were 0.88 and on the right 0.7-1 the left. He will definitely need formal arterial studies 07/20/17; my end of the day note on this patient from last week totally neglected to mention wounds over his right second third and fourth PIPs in his right hand which she obtained while trying to punch away ice. We've been using silver alginate to these areas as well. The area on the fourth PIP is healed. Second is much better. Third still covered by a large amount of adherent nonviable tissue oThe 3 wound areas from last week including the distal blister all look a lot better. Erythema and edema in the foot is  better. Culture I did was negative x-ray of the foot I did was negative of the right foot and ankle ARTERIAL STUDIES showed an ABI on the right of 0.73 on the left hip 0.92. However TBIs were normal at 0.75 on the right and 0.73 on the left. Waveforms were triphasic bilaterally at the posterior and anterior tibial. The wounds will have enough blood flow for healing but I thought this should come to the attention of vascular surgeon especially with the reduced ABI on the right vis-o-vis his uncontrolled diabetes. 07/27/17; the area on the second fourth PIP is closed. Third still open but much better. All the wounds on his right foot and ankle look considerably better. Using silver alginate all wound areas. He saw vascular surgery and has an angiogram booked for tomorrow with Dr. Wyn Quaker. He sees dermatology next week Electronic Signature(s) Signed: 07/27/2017 5:05:40 PM By: Baltazar Najjar MD Entered By: Baltazar Najjar on 07/27/2017 09:31:51 Ryan Leonard ([id number]) -------------------------------------------------------------------------------- Physical Exam Details Patient Name: Ryan Leonard Date of Service: 07/27/2017 9:00 AM Medical Record Number: [id number] Patient Account Number: [id number] Date of Birth/Sex: Feb 25, 1976 (42 y.o. Male) Treating RN: Huel Coventry Primary Care Provider: Naoma Diener Other Clinician: Referring Provider: Naoma Diener Treating Provider/Extender: Maxwell Caul Weeks in Treatment: 2 Constitutional Patient is hypertensive.. Pulse regular and within target range for patient.Marland Kitchen Respirations regular, non-labored and within target range.. Temperature is normal and within the target range for the patient.Marland Kitchen appears in no distress. Eyes Conjunctivae clear. No discharge. Respiratory Respiratory effort is easy and symmetric bilaterally. Rate is normal at rest and on room air.. Cardiovascular Pedal  pulses palpable and strong bilaterally.. Integumentary  (Hair, Skin) Extensive clusters of small macules on his anterior tibia area bilaterally. Scattering of these on his forearms. 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 Notes Wound exam; 3 areas on his right foot and ankle all smaller and considerably better. He still has some surface slough which is mild on the proximal 2 wounds however these are smaller and I elected not to debride these this week oOn the right third PIP this is considerably smaller and again no debridement necessary. The fourth finger PIP wound is healed. Electronic Signature(s) Signed: 07/27/2017 5:05:40 PM By: Baltazar Najjar MD Entered By: Baltazar Najjar on 07/27/2017 09:35:47 Ryan Leonard ([id number]) -------------------------------------------------------------------------------- Physician Orders Details Patient Name: Ryan Leonard Date of Service: 07/27/2017 9:00 AM Medical Record Number: [id number] Patient Account Number: [id number] Date of Birth/Sex: Nov 02, 1975 (42 y.o. Male) Treating RN: Huel Coventry Primary Care Provider: Naoma Diener Other Clinician: Referring Provider: Naoma Diener Treating Provider/Extender: Altamese Lambert in Treatment: 2 Verbal / Phone Orders: No Diagnosis Coding Wound Cleansing Wound #1 Right,Distal,Dorsal Foot o Clean wound with Normal Saline. o Cleanse wound with mild soap and water o May Shower, gently pat wound dry prior to applying new dressing. Wound #2 Right,Proximal,Dorsal Foot o Clean wound with Normal Saline. o Cleanse wound with mild soap and water o May Shower, gently pat wound dry prior to applying new dressing. Wound #3 Right Lower Leg o Clean wound with Normal Saline. o Cleanse wound with mild soap and water o May Shower, gently pat wound dry prior to applying new dressing. Wound #4 Right Hand - 2nd Digit o Clean wound with Normal Saline. o Cleanse wound with mild soap and water o May Shower, gently  pat wound dry prior to applying new dressing. Wound #5 Right Hand - 3rd Digit o Clean wound with Normal Saline. o Cleanse wound with mild soap and water o May Shower, gently pat wound dry prior to applying new dressing. Anesthetic (add to Medication List) Wound #1 Right,Distal,Dorsal Foot o Topical Lidocaine 4% cream applied to wound bed prior to debridement (In Clinic Only). Wound #2 Right,Proximal,Dorsal Foot o Topical Lidocaine 4% cream applied to wound bed prior to debridement (In Clinic Only). Wound #3 Right Lower Leg o Topical Lidocaine 4% cream applied to wound bed prior to debridement (In Clinic Only). Wound #4 Right Hand - 2nd Digit o Topical Lidocaine 4% cream applied to wound bed prior to debridement (In Clinic Only). Wound #5 Right Hand - 3rd Digit o Topical Lidocaine 4% cream applied to wound bed prior to debridement (In Clinic Only). Primary Wound Dressing Wound #1 Right,Distal,Dorsal Foot Ryan Leonard, Ryan Leonard ([id number]) o Silvercel Non-Adherent Wound #2 Right,Proximal,Dorsal Foot o Silvercel Non-Adherent Wound #3 Right Lower Leg o Silvercel Non-Adherent Wound #4 Right Hand - 2nd Digit o Silvercel Non-Adherent Wound #5 Right Hand - 3rd Digit o Silvercel Non-Adherent Secondary Dressing Wound #1 Right,Distal,Dorsal Foot o ABD pad o Conform/Kerlix o Other - tape, stretch netting #5 Wound #2 Right,Proximal,Dorsal Foot o ABD pad o Conform/Kerlix o Other - tape, stretch netting #5 Wound #3 Right Lower Leg o ABD pad o Conform/Kerlix o Other - tape, stretch netting #5 Wound #4 Right Hand - 2nd Digit o ABD pad o Conform/Kerlix o Other - tape, stretch netting #5 Wound #5 Right Hand - 3rd Digit o ABD pad o Conform/Kerlix o Other - tape, stretch netting #5 Dressing Change Frequency Wound #1 Right,Distal,Dorsal Foot o Change dressing every other day. o Other: -  as needed Wound #2 Right,Proximal,Dorsal  Foot o Change dressing every other day. o Other: - as needed Wound #3 Right Lower Leg o Change dressing every other day. o Other: - as needed Wound #4 Right Hand - 2nd Digit o Change dressing every other day. o Other: - as needed Ryan Leonard, Ryan Leonard ([id number]) Wound #5 Right Hand - 3rd Digit o Change dressing every other day. o Other: - as needed Follow-up Appointments Wound #1 Right,Distal,Dorsal Foot o Return Appointment in 1 week. Wound #2 Right,Proximal,Dorsal Foot o Return Appointment in 1 week. Wound #3 Right Lower Leg o Return Appointment in 1 week. Wound #4 Right Hand - 2nd Digit o Return Appointment in 1 week. Wound #5 Right Hand - 3rd Digit o Return Appointment in 1 week. Edema Control Wound #1 Right,Distal,Dorsal Foot o Elevate legs to the level of the heart and pump ankles as often as possible Wound #2 Right,Proximal,Dorsal Foot o Elevate legs to the level of the heart and pump ankles as often as possible Wound #3 Right Lower Leg o Elevate legs to the level of the heart and pump ankles as often as possible Wound #4 Right Hand - 2nd Digit o Elevate legs to the level of the heart and pump ankles as often as possible Wound #5 Right Hand - 3rd Digit o Elevate legs to the level of the heart and pump ankles as often as possible Additional Orders / Instructions Wound #1 Right,Distal,Dorsal Foot o Increase protein intake. o Other: - Vitamin C, Zinc Wound #2 Right,Proximal,Dorsal Foot o Increase protein intake. o Other: - Vitamin C, Zinc Wound #3 Right Lower Leg o Increase protein intake. o Other: - Vitamin C, Zinc Wound #4 Right Hand - 2nd Digit o Increase protein intake. o Other: - Vitamin C, Zinc Wound #5 Right Hand - 3rd Digit o Increase protein intake. Ryan Leonard, Ryan Leonard ([id number]) o Other: - Vitamin C, Zinc Patient Medications Allergies: codeine Notifications Medication Indication Start  End lidocaine DOSE topical 4 % cream - cream topical Electronic Signature(s) Signed: 07/27/2017 5:05:40 PM By: Baltazar Najjar MD Signed: 07/27/2017 5:24:42 PM By: Elliot Gurney, BSN, RN, CWS, Kim RN, BSN Entered By: Elliot Gurney, BSN, RN, CWS, Kim on 07/27/2017 09:24:46 Ryan Leonard, Ryan Leonard ([id number]) -------------------------------------------------------------------------------- Prescription 07/27/2017 Patient Name: Ryan Leonard Provider: Maxwell Caul MD Date of Birth: 1975/12/09 NPI#: [id number] Sex: Judie Petit DEA#: [id number] Phone #: [phone] License #: [id number] Patient Address: [facility] [address] [facility] [place], [place] [address] [address], [address] [place], [place] [address] [phone] Allergies codeine Medication Medication: Route: Strength: Form: lidocaine topical 4% cream Class: TOPICAL LOCAL ANESTHETICS Dose: Frequency / Time: Indication: cream topical Number of Refills: Number of Units: 0 Generic Substitution: Start Date: End Date: Administered at Substitution Permitted Facility: Yes Time Administered: Time Discontinued: Note to Pharmacy: Signature(s): Date(s): Electronic Signature(s) Signed: 07/27/2017 5:05:40 PM By: Baltazar Najjar MD Signed: 07/27/2017 5:24:42 PM By: Elliot Gurney, BSN, RN, CWS, Kim RN, BSN Entered By: Elliot Gurney, BSN, RN, CWS, Kim on 07/27/2017 09:24:46 Ryan Leonard ([id number]) Ryan Leonard, Ryan Leonard ([id number]) --------------------------------------------------------------------------------  Problem List Details Patient Name: Ryan Leonard Date of Service: 07/27/2017 9:00 AM Medical Record Number: [id number] Patient Account Number: [id number] Date of Birth/Sex: 03-17-1976 (42 y.o. 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 E11.51 Type 2 diabetes mellitus with diabetic peripheral angiopathy without 07/13/2017 Yes gangrene E11.42 Type 2 diabetes mellitus with diabetic polyneuropathy 07/13/2017 Yes S61.411D Laceration without foreign body of right hand, subsequent 07/20/2017 Yes encounter Inactive Problems Resolved Problems Electronic Signature(s) Signed: 07/27/2017 5:05:40 PM By: Baltazar Najjarobson, Jadalee Westcott MD Entered By: Baltazar Najjarobson, Maks Cavallero on 07/27/2017 09:29:29 Ryan ChurchWILLIAMS, Ryan Leonard ([id number]) -------------------------------------------------------------------------------- Progress Note Details Patient Name: Ryan ChurchWILLIAMS, Ryan Leonard Date of Service: 07/27/2017 9:00 AM Medical Record Number: [id number] Patient Account Number: [id number] Date of Birth/Sex: December 11, 1975 [age] y.o. Male) Treating RN: Huel CoventryWoody, Kim Primary Care Provider: Naoma DienerKOMIVES, EUGENIE Other Clinician: Referring Provider: Naoma DienerKOMIVES, EUGENIE Treating Provider/Extender: Maxwell CaulOBSON, Marykate Heuberger G Weeks in Treatment: 2 Subjective History of Present Illness (HPI) 07/13/17; this is a 42 year old man who is a poorly controlled diabetic with a hemoglobin A1c of over 14. Recently transitioned from oral agents to the addition of long-acting basal insulin. For the last 2 weeks he has had problems with ulcerations on his right foot this includes 2 shallow ulcerations one just distal to the ankle and a smaller one distal to this and a new recent blister just proximal to his toes. Last week his entire foot and lower ankle became intensely swollen and painful he was seen in the emergency room on 07[facility]10[facility]. Diagnosed with a diabetic foot infection and put on Augmentin. This has  helped somewhat with the swelling but he is still having a fair amount of pain and yesterday found it difficult to walk. He has not been systemically unwell. ABIs in this clinic were 0.88 and on the right 0.7-1 the left. He will definitely need formal arterial studies 07/20/17; my end of the day note on this patient from last week totally neglected to mention wounds over his right second third and fourth PIPs in his right hand which she obtained while trying to punch away ice. We've been using silver alginate to these areas as well. The area on the fourth PIP is healed. Second is much better. Third still covered by a large amount of adherent nonviable tissue The 3 wound areas from last week including the distal blister all look a lot better. Erythema and edema in the foot is better. Culture I did was negative x-ray of the foot I did was negative of the right foot and ankle ARTERIAL STUDIES showed an ABI on the right of 0.73 on the left hip 0.92. However TBIs were normal at 0.75 on the right and 0.73 on the left. Waveforms were triphasic bilaterally at the posterior and anterior tibial. The wounds will have enough blood flow for healing but I thought this should come to the attention of vascular surgeon especially with the reduced ABI on the right vis--vis his uncontrolled diabetes. 07/27/17; the area on the second fourth PIP is closed. Third still open but much better. All the wounds on his right foot and ankle look considerably better. Using silver alginate all wound areas. He saw vascular surgery and has an angiogram booked for tomorrow with Dr. Wyn Quakerew. He sees dermatology next week Objective Constitutional Patient is hypertensive.. Pulse regular and within target range for patient.Marland Kitchen. Respirations regular, non-labored and within target range.. Temperature is normal and within the target range for the patient.Marland Kitchen. appears in no distress. Vitals Time Taken: 9:00 AM, Height: 72 in, Weight: 226.5 lbs,  BMI: 30.7, Temperature: 98.1 F, Pulse: 75  bpm, Respiratory Rate: 20 breaths/min, Blood Pressure: 163/89 mmHg. Eyes Conjunctivae clear. No discharge. Respiratory ZYRION, COEY ([id number]) Respiratory effort is easy and symmetric bilaterally. Rate is normal at rest and on room air.. Cardiovascular Pedal pulses palpable and strong bilaterally.. General Notes: Wound exam; 3 areas on his right foot and ankle all smaller and considerably better. He still has some surface slough which is mild on the proximal 2 wounds however these are smaller and I elected not to debride these this week On the right third PIP this is considerably smaller and again no debridement necessary. The fourth finger PIP wound is healed. Integumentary (Hair, Skin) Extensive clusters of small macules on his anterior tibia area bilaterally. Scattering of these on his forearms. 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 Wound #1 status is Open. Original cause of wound was Blister. The wound is located on the Right,Distal,Dorsal Foot. The wound measures 2.8cm length x 2.7cm width x 0.1cm depth; 5.938cm^2 area and 0.594cm^3 volume. There is no tunneling or undermining noted. There is a large amount of serous drainage noted. The wound margin is distinct with the outline attached to the wound base. There is large (67-100%) red granulation within the wound bed. There is a small (1-33%) amount of necrotic tissue within the wound bed including Adherent Slough. The periwound skin appearance exhibited: Erythema. The periwound skin appearance did not exhibit: Maceration. The surrounding wound skin color is noted with erythema which is circumferential. Periwound temperature was noted as Hot. The periwound has tenderness on palpation. Wound #2 status is Open. Original cause of wound was Gradually Appeared. The wound is located on the Right,Proximal,Dorsal Foot. The wound measures 1cm length x 0.7cm width x 0.1cm  depth; 0.55cm^2 area and 0.055cm^3 volume. There is no tunneling or undermining noted. There is a large amount of serous drainage noted. The wound margin is distinct with the outline attached to the wound base. There is large (67-100%) pink granulation within the wound bed. There is a small (1-33%) amount of necrotic tissue within the wound bed including Adherent Slough. The periwound skin appearance exhibited: Erythema. The surrounding wound skin color is noted with erythema which is circumferential. Periwound temperature was noted as Hot. The periwound has tenderness on palpation. Wound #3 status is Open. Original cause of wound was Gradually Appeared. The wound is located on the Right Lower Leg. The wound measures 1.7cm length x 2cm width x 0.1cm depth; 2.67cm^2 area and 0.267cm^3 volume. There is no tunneling or undermining noted. There is a large amount of serous drainage noted. The wound margin is distinct with the outline attached to the wound base. There is large (67-100%) red granulation within the wound bed. There is a small (1-33%) amount of necrotic tissue within the wound bed including Adherent Slough. Periwound temperature was noted as No Abnormality. The periwound has tenderness on palpation. Wound #4 status is Open. Original cause of wound was Trauma. The wound is located on the Right Hand - 2nd Digit. The wound measures 0.4cm length x 0.4cm width x 0.1cm depth; 0.126cm^2 area and 0.013cm^3 volume. There is no tunneling or undermining noted. There is a medium amount of serous drainage noted. The wound margin is distinct with the outline attached to the wound base. There is no granulation within the wound bed. There is a large (67-100%) amount of necrotic tissue within the wound bed including Eschar and Adherent Slough. Periwound temperature was noted as No Abnormality. The periwound has tenderness on palpation. Wound #5 status  is Open. Original cause of wound was Trauma. The wound  is located on the Right Hand - 3rd Digit. The wound measures 0.8cm length x 0.9cm width x 0.1cm depth; 0.565cm^2 area and 0.057cm^3 volume. There is no tunneling or undermining noted. There is a large amount of serous drainage noted. The wound margin is distinct with the outline attached to the wound base. There is medium (34-66%) pink granulation within the wound bed. There is a medium (34-66%) amount of necrotic tissue within the wound bed including Adherent Slough. Periwound temperature was noted as No Abnormality. The periwound has tenderness on palpation. Wound #6 status is Healed - Epithelialized. Original cause of wound was Trauma. The wound is located on the Right Hand - 4th Digit. The wound measures 0cm length x 0cm width x 0cm depth; 0cm^2 area and 0cm^3 volume. There is no tunneling or undermining noted. There is a none present amount of drainage noted. The wound margin is distinct with the outline attached to the wound base. There is no granulation within the wound bed. There is no necrotic tissue within the wound bed. Periwound temperature was noted as No Abnormality. 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 Cleanse wound with mild soap and water May Shower, gently pat wound dry prior to applying new dressing. Wound #2 Right,Proximal,Dorsal Foot: Clean wound with Normal Saline. Cleanse wound with mild soap and water May Shower, gently pat wound dry prior to  applying new dressing. Wound #3 Right Lower Leg: Clean wound with Normal Saline. Cleanse wound with mild soap and water May Shower, gently pat wound dry prior to applying new dressing. Wound #4 Right Hand - 2nd Digit: Clean wound with Normal Saline. Cleanse wound with mild soap and water May Shower, gently pat wound dry prior to applying new dressing. Wound #5 Right Hand - 3rd Digit: Clean wound with Normal Saline. Cleanse wound with mild soap and water May Shower, gently pat wound dry prior to applying new dressing. Anesthetic (add to Medication List): Wound #1 Right,Distal,Dorsal Foot: Topical Lidocaine 4% cream applied to wound bed prior to debridement (In Clinic Only). Wound #2 Right,Proximal,Dorsal Foot: Topical Lidocaine 4% cream applied to wound bed prior to debridement (In Clinic Only). Wound #3 Right Lower Leg: Topical Lidocaine 4% cream applied to wound bed prior to debridement (In Clinic Only). Wound #4 Right Hand - 2nd Digit: Topical Lidocaine 4% cream applied to wound bed prior to debridement (In Clinic Only). Wound #5 Right Hand - 3rd Digit: Topical Lidocaine 4% cream applied to wound bed prior to debridement (In Clinic Only). Primary Wound Dressing: Wound #1 Right,Distal,Dorsal Foot: Silvercel Non-Adherent Wound #2 Right,Proximal,Dorsal Foot: Silvercel Non-Adherent Ryan Leonard, Ryan Leonard ([id number]) Wound #3 Right Lower Leg: Silvercel Non-Adherent Wound #4 Right Hand - 2nd Digit: Silvercel Non-Adherent Wound #5 Right Hand - 3rd Digit: Silvercel Non-Adherent Secondary Dressing: Wound #1 Right,Distal,Dorsal Foot: ABD pad Conform/Kerlix Other - tape, stretch netting #5 Wound #2 Right,Proximal,Dorsal Foot: ABD pad Conform/Kerlix Other - tape, stretch netting #5 Wound #3 Right Lower Leg: ABD pad Conform/Kerlix Other - tape, stretch netting #  5 Wound #4 Right Hand - 2nd Digit: ABD pad Conform/Kerlix Other - tape, stretch netting #5 Wound #5 Right Hand - 3rd  Digit: ABD pad Conform/Kerlix Other - tape, stretch netting #5 Dressing Change Frequency: Wound #1 Right,Distal,Dorsal Foot: Change dressing every other day. Other: - as needed Wound #2 Right,Proximal,Dorsal Foot: Change dressing every other day. Other: - as needed Wound #3 Right Lower Leg: Change dressing every other day. Other: - as needed Wound #4 Right Hand - 2nd Digit: Change dressing every other day. Other: - as needed Wound #5 Right Hand - 3rd Digit: Change dressing every other day. Other: - as needed Follow-up Appointments: Wound #1 Right,Distal,Dorsal Foot: Return Appointment in 1 week. Wound #2 Right,Proximal,Dorsal Foot: Return Appointment in 1 week. Wound #3 Right Lower Leg: Return Appointment in 1 week. Wound #4 Right Hand - 2nd Digit: Return Appointment in 1 week. Wound #5 Right Hand - 3rd Digit: Return Appointment in 1 week. Edema Control: Wound #1 Right,Distal,Dorsal Foot: Elevate legs to the level of the heart and pump ankles as often as possible Wound #2 Right,Proximal,Dorsal Foot: Elevate legs to the level of the heart and pump ankles as often as possible Wound #3 Right Lower Leg: Ryan Leonard, Ryan Leonard ([id number]) Elevate legs to the level of the heart and pump ankles as often as possible Wound #4 Right Hand - 2nd Digit: Elevate legs to the level of the heart and pump ankles as often as possible Wound #5 Right Hand - 3rd Digit: Elevate legs to the level of the heart and pump ankles as often as possible Additional Orders / Instructions: Wound #1 Right,Distal,Dorsal Foot: Increase protein intake. Other: - Vitamin C, Zinc Wound #2 Right,Proximal,Dorsal Foot: Increase protein intake. Other: - Vitamin C, Zinc Wound #3 Right Lower Leg: Increase protein intake. Other: - Vitamin C, Zinc Wound #4 Right Hand - 2nd Digit: Increase protein intake. Other: - Vitamin C, Zinc Wound #5 Right Hand - 3rd Digit: Increase protein intake. Other: - Vitamin C, Zinc The  following medication(s) was prescribed: lidocaine topical 4 % cream cream topical was prescribed at facility o #1 continue silver alginate to all wound areas including the 3 on the right lower extremity and his right third PIP. 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 #4 the patient still had some slough on the proximal 2 wounds in the lower extremity however the wounds are smaller and look like they're contracting, I'll hold further debridement for serial reviews #5 the patient states his blood sugars are under much better control Electronic Signature(s) Signed: 07/27/2017 5:05:40 PM By: Baltazar Najjar MD Entered By: Baltazar Najjar on 07/27/2017 09:38:08 Ryan Leonard ([id number]) -------------------------------------------------------------------------------- SuperBill Details Patient Name: Ryan Leonard Date of Service: 07/27/2017 Medical Record Number: [id number] Patient Account Number: [id number] Date of Birth/Sex: 1975-12-17 (42 y.o. Male) Treating RN: Huel Coventry Primary Care Provider: Naoma Diener Other Clinician: Referring Provider: Naoma Diener Treating Provider/Extender: Maxwell Caul Weeks in Treatment: 2 Diagnosis Coding ICD-10 Codes Code Description E11.621 Type 2 diabetes mellitus with foot ulcer L03.115 Cellulitis of right lower limb L97.511 Non-pressure chronic ulcer of other part of right foot limited to breakdown of skin E11.51 Type 2 diabetes mellitus with diabetic peripheral angiopathy without gangrene E11.42 Type 2 diabetes mellitus with diabetic polyneuropathy S61.411D Laceration without foreign body of right hand, subsequent encounter Facility Procedures CPT4 Code: [id number] Description: 99214 - WOUND CARE VISIT-LEV 4 EST PT Modifier: Quantity: 1 Physician Procedures CPT4 Code Description: [id number] - WC PHYS LEVEL 3 -  EST PT ICD-10 Diagnosis Description E11.621 Type 2 diabetes mellitus with foot ulcer L97.511  Non-pressure chronic ulcer of other part of right foot limited S61.411D Laceration without foreign body  of right hand, subsequent enco Modifier: to breakdown of unter Quantity: 1 skin Electronic Signature(s) Signed: 07/27/2017 5:05:40 PM By: Baltazar Najjar MD Entered By: Baltazar Najjar on 07/27/2017 09:38:40

## 2017-07-28 NOTE — H&P (Signed)
[facility] History & Physical Update  The patient was interviewed and re-examined.  The patient's previous History and Physical has been reviewed and is unchanged.  There is no change in the plan of care. We plan to proceed with the scheduled procedure.  Alyssia Heese, MD  07/28/2017, 8:21 AM    

## 2017-07-30 NOTE — Progress Notes (Signed)
DEMETRIE, BORGE ([id number]) Visit Report for 07/27/2017 Arrival Information Details Patient Name: Ryan Leonard, Ryan Leonard Date of Service: 07/27/2017 9:00 AM Medical Record Number: [id number] Patient Account Number: [id number] Date of Birth/Sex: Aug 18, 1975 (42 y.o. Male) Treating RN: Phillis Haggis Primary Care [name]: Naoma Diener Other Clinician: Referring Ayven Pheasant: Naoma Diener Treating [name]/Extender: Altamese Sedan in Treatment: 2 Visit Information History Since Last Visit All ordered tests and consults were completed: No Patient Arrived: Ambulatory Added or deleted any medications: No Arrival Time: 08:57 Any new allergies or adverse reactions: No Accompanied By: son Had a fall or experienced change in No Transfer Assistance: None activities of daily living that may affect Patient Identification Verified: Yes risk of falls: Secondary Verification Process Completed: Yes Signs or symptoms of abuse/neglect since last visito No Patient Requires Transmission-Based No Hospitalized since last visit: No Precautions: Has Dressing in Place as Prescribed: Yes Patient Has Alerts: Yes Pain Present Now: No Patient Alerts: DM II Electronic Signature(s) Signed: 07/29/2017 8:00:35 AM By: Alejandro Mulling Entered By: Alejandro Mulling on 07/27/2017 08:58:25 Ryan Leonard ([id number]) -------------------------------------------------------------------------------- Clinic Level of Care Assessment Details Patient Name: Ryan Leonard Date of Service: 07/27/2017 9:00 AM Medical Record Number: [id number] Patient Account Number: [id number] Date of Birth/Sex: 02-27-1976 (42 y.o. Male) Treating RN: Huel Coventry Primary Care Ahaana Rochette: Naoma Diener Other Clinician: Referring Amyia Lodwick: Naoma Diener Treating Anand Tejada/Extender: Altamese Burden in Treatment: 2 Clinic Level of Care Assessment Items TOOL 4 Quantity Score []  - Use when only an EandM is performed on FOLLOW-UP  visit 0 ASSESSMENTS - Nursing Assessment / Reassessment []  - Reassessment of Co-morbidities (includes updates in patient status) 0 X- 1 5 Reassessment of Adherence to Treatment Plan ASSESSMENTS - Wound and Skin Assessment / Reassessment []  - Simple Wound Assessment / Reassessment - one wound 0 X- 5 5 Complex Wound Assessment / Reassessment - multiple wounds []  - 0 Dermatologic / Skin Assessment (not related to wound area) ASSESSMENTS - Focused Assessment []  - Circumferential Edema Measurements - multi extremities 0 []  - 0 Nutritional Assessment / Counseling / Intervention []  - 0 Lower Extremity Assessment (monofilament, tuning fork, pulses) []  - 0 Peripheral Arterial Disease Assessment (using hand held doppler) ASSESSMENTS - Ostomy and/or Continence Assessment and Care []  - Incontinence Assessment and Management 0 []  - 0 Ostomy Care Assessment and Management (repouching, etc.) PROCESS - Coordination of Care X - Simple Patient / Family Education for ongoing care 1 15 []  - 0 Complex (extensive) Patient / Family Education for ongoing care []  - 0 Staff obtains Chiropractor, Records, Test Results / Process Orders []  - 0 Staff telephones HHA, Nursing Homes / Clarify orders / etc []  - 0 Routine Transfer to another Facility (non-emergent condition) []  - 0 Routine Hospital Admission (non-emergent condition) []  - 0 New Admissions / Manufacturing engineer / Ordering NPWT, Apligraf, etc. []  - 0 Emergency Hospital Admission (emergent condition) X- 1 10 Simple Discharge Coordination [name], [name] ([id number]) []  - 0 Complex (extensive) Discharge Coordination PROCESS - Special Needs []  - Pediatric / Minor Patient Management 0 []  - 0 Isolation Patient Management []  - 0 Hearing / Language / Visual special needs []  - 0 Assessment of Community assistance (transportation, D/C planning, etc.) []  - 0 Additional assistance / Altered mentation []  - 0 Support Surface(s) Assessment (bed,  cushion, seat, etc.) INTERVENTIONS - Wound Cleansing / Measurement []  - Simple Wound Cleansing - one wound 0 X- 5 5 Complex Wound Cleansing - multiple wounds X- 1 5 Wound Imaging (photographs - any number of wounds) []  -  0 Wound Tracing (instead of photographs) []  - 0 Simple Wound Measurement - one wound X- 5 5 Complex Wound Measurement - multiple wounds INTERVENTIONS - Wound Dressings X - Small Wound Dressing one or multiple wounds 2 10 X- 1 15 Medium Wound Dressing one or multiple wounds []  - 0 Large Wound Dressing one or multiple wounds []  - 0 Application of Medications - topical []  - 0 Application of Medications - injection INTERVENTIONS - Miscellaneous []  - External ear exam 0 []  - 0 Specimen Collection (cultures, biopsies, blood, body fluids, etc.) []  - 0 Specimen(s) / Culture(s) sent or taken to Lab for analysis []  - 0 Patient Transfer (multiple staff / Nurse, adult / Similar devices) []  - 0 Simple Staple / Suture removal (25 or less) []  - 0 Complex Staple / Suture removal (26 or more) []  - 0 Hypo / Hyperglycemic Management (close monitor of Blood Glucose) []  - 0 Ankle / Brachial Index (ABI) - do not check if billed separately X- 1 5 Vital Signs [name], [name] ([id number]) Has the patient been seen at the hospital within the last three years: Yes Total Score: 150 Level Of Care: New/Established - Level 4 Electronic Signature(s) Signed: 07/27/2017 5:24:42 PM By: Elliot Gurney, BSN, RN, CWS, Kim RN, BSN Entered By: Elliot Gurney, BSN, RN, CWS, Kim on 07/27/2017 09:36:57 Ryan Leonard ([id number]) -------------------------------------------------------------------------------- Encounter Discharge Information Details Patient Name: Ryan Leonard Date of Service: 07/27/2017 9:00 AM Medical Record Number: [id number] Patient Account Number: [id number] Date of Birth/Sex: May 25, 1976 (42 y.o. Male) Treating RN: Huel Coventry Primary Care Virginia Curl: Naoma Diener Other  Clinician: Referring Kaiyah Eber: Naoma Diener Treating Ladanian Kelter/Extender: Altamese [place] in Treatment: 2 Encounter Discharge Information Items Discharge Pain Level: 0 Discharge Condition: Stable Ambulatory Status: Ambulatory Discharge Destination: Home Transportation: Private Auto Accompanied By: son Schedule Follow-up Appointment: Yes Medication Reconciliation completed and No provided to Patient/Care Sabrie Moritz: Provided on Clinical Summary of Care: 07/27/2017 Form Type Recipient Paper Patient BW Electronic Signature(s) Signed: 07/27/2017 4:58:37 PM By: Curtis Sites Entered By: Curtis Sites on 07/27/2017 09:35:28 Ryan Leonard ([id number]) -------------------------------------------------------------------------------- Lower Extremity Assessment Details Patient Name: Ryan Leonard Date of Service: 07/27/2017 9:00 AM Medical Record Number: [id number] Patient Account Number: [id number] Date of Birth/Sex: Oct 23, 1975 (42 y.o. Male) Treating RN: Phillis Haggis Primary Care Ngoc Detjen: Naoma Diener Other Clinician: Referring Kennidee Heyne: Naoma Diener Treating Venda Dice/Extender: Maxwell Caul Weeks in Treatment: 2 Vascular Assessment Pulses: Dorsalis Pedis Palpable: [Right:No] Doppler Audible: [Right:Yes] Posterior Tibial Extremity colors, hair growth, and conditions: Extremity Color: [Right:Hyperpigmented] Temperature of Extremity: [Right:Warm] Capillary Refill: [Right:< 3 seconds] Toe Nail Assessment Left: Right: Thick: Yes Discolored: No Deformed: No Improper Length and Hygiene: Yes Electronic Signature(s) Signed: 07/29/2017 8:00:35 AM By: Alejandro Mulling Entered By: Alejandro Mulling on 07/27/2017 09:14:41 Ryan Leonard ([id number]) -------------------------------------------------------------------------------- Multi Wound Chart Details Patient Name: Ryan Leonard Date of Service: 07/27/2017 9:00 AM Medical Record Number:  [id number] Patient Account Number: [id number] Date of Birth/Sex: [birth year] (42 y.o. Male) Treating RN: Huel Coventry Primary Care Latasha Buczkowski: Naoma Diener Other Clinician: Referring Karina Nofsinger: Naoma Diener Treating Unnamed Hino/Extender: Maxwell Caul Weeks in Treatment: 2 Vital Signs Height(in): 72 Pulse(bpm): 75 Weight(lbs): 226.5 Blood Pressure(mmHg): 163/89 Body Mass Index(BMI): 31 Temperature(F): 98.1 Respiratory Rate 20 (breaths/min): Photos: [1:No Photos] [2:No Photos] [3:No Photos] Wound Location: [1:Right Foot - Dorsal, Distal] [2:Right Foot - Dorsal, Proximal] [3:Right Lower Leg] Wounding Event: [1:Blister] [2:Gradually Appeared] [3:Gradually Appeared] Primary Etiology: [1:Diabetic Wound/Ulcer of the Lower Extremity] [2:Diabetic Wound/Ulcer of the Lower Extremity] [3:Diabetic Wound/Ulcer of the Lower Extremity] Comorbid History: [1:Asthma, Hypertension,  Type II Diabetes, Neuropathy] [2:Asthma, Hypertension, Type II Diabetes, Neuropathy] [3:Asthma, Hypertension, Type II Diabetes, Neuropathy] Date Acquired: [1:07/09/2017] [2:06/29/2017] [3:06/29/2017] Weeks of Treatment: [1:2] [2:2] [3:2] Wound Status: [1:Open] [2:Open] [3:Open] Pending Amputation on [1:Yes] [2:Yes] [3:Yes] Presentation: Measurements L x W x D [1:2.8x2.7x0.1] [2:1x0.7x0.1] [3:1.7x2x0.1] (cm) Area (cm) : [1:5.938] [2:0.55] [3:2.67] Volume (cm) : [1:0.594] [2:0.055] [3:0.267] % Reduction in Area: [1:58.00%] [2:22.20%] [3:9.10%] % Reduction in Volume: [1:58.00%] [2:22.50%] [3:9.20%] Classification: [1:Grade 1] [2:Grade 2] [3:Grade 2] Exudate Amount: [1:Large] [2:Large] [3:Large] Exudate Type: [1:Serous] [2:Serous] [3:Serous] Exudate Color: [1:amber] [2:amber] [3:amber] Wound Margin: [1:Distinct, outline attached] [2:Distinct, outline attached] [3:Distinct, outline attached] Granulation Amount: [1:Large (67-100%)] [2:Large (67-100%)] [3:Large (67-100%)] Granulation Quality: [1:Red] [2:Pink]  [3:Red] Necrotic Amount: [1:Small (1-33%)] [2:Small (1-33%)] [3:Small (1-33%)] Necrotic Tissue: [1:Adherent Slough] [2:Adherent Slough] [3:Adherent Slough] Exposed Structures: [1:Fascia: No Fat Layer (Subcutaneous Tissue) Exposed: No Tendon: No Muscle: No Joint: No Bone: No] [2:Fascia: No Fat Layer (Subcutaneous Tissue) Exposed: No Tendon: No Muscle: No Joint: No Bone: No] [3:Fascia: No Fat Layer (Subcutaneous Tissue) Exposed:  No Tendon: No Muscle: No Joint: No Bone: No] Epithelialization: [1:None] [2:None] [3:None] Periwound Skin Texture: [1:No Abnormalities Noted] [2:No Abnormalities Noted] [3:No Abnormalities Noted] Periwound Skin Moisture: [1:Maceration: No] [2:No Abnormalities Noted] [3:No Abnormalities Noted] Periwound Skin Color: Erythema: Yes Erythema: Yes No Abnormalities Noted Erythema Location: Circumferential Circumferential N/A Temperature: Hot Hot No Abnormality Tenderness on Palpation: Yes Yes Yes Wound Preparation: Ulcer Cleansing: Ulcer Cleansing: Ulcer Cleansing: Rinsed/Irrigated with Saline Rinsed/Irrigated with Saline Rinsed/Irrigated with Saline Topical Anesthetic Applied: Topical Anesthetic Applied: Topical Anesthetic Applied: Other: lidocaine 4% Other: lidocaine 4% Other: lidocaine 4% Wound Number: [id number]  Photos: No Photos No Photos No Photos Wound Location: Right Hand - 2nd Digit Right Hand - 3rd Digit Right Hand - 4th Digit Wounding Event: Trauma Trauma Trauma Primary Etiology: Trauma, Other Trauma, Other Trauma, Other Comorbid History: Asthma, Hypertension, Type II Asthma, Hypertension, Type II Asthma, Hypertension, Type II Diabetes, Neuropathy Diabetes, Neuropathy Diabetes, Neuropathy Date Acquired: 06/29/2017 06/29/2017 06/29/2017 Weeks of Treatment: 2 2 2  Wound Status: Open Open Healed - Epithelialized Pending Amputation on Yes Yes Yes Presentation: Measurements L x W x D 0.4x0.4x0.1 0.8x0.9x0.1 0x0x0 (cm) Area (cm) : 0.126 0.565 0 Volume (cm) :  0.013 0.057 0 % Reduction in Area: 46.60% 80.00% 100.00% % Reduction in Volume: 45.80% 79.90% 100.00% Classification: Full Thickness Without Full Thickness Without Full Thickness Without Exposed Support Structures Exposed Support Structures Exposed Support Structures Exudate Amount: Medium Large None Present Exudate Type: Serous Serous N/A Exudate Color: Media planner N/A Wound Margin: Distinct, outline attached Distinct, outline attached Distinct, outline attached Granulation Amount: None Present (0%) Medium (34-66%) None Present (0%) Granulation Quality: N/A Pink N/A Necrotic Amount: Large (67-100%) Medium (34-66%) None Present (0%) Necrotic Tissue: Eschar, Adherent Slough Adherent Slough N/A Exposed Structures: Fascia: No Fascia: No Fascia: No Fat Layer (Subcutaneous Fat Layer (Subcutaneous Fat Layer (Subcutaneous Tissue) Exposed: No Tissue) Exposed: No Tissue) Exposed: No Tendon: No Tendon: No Tendon: No Muscle: No Muscle: No Muscle: No Joint: No Joint: No Joint: No Bone: No Bone: No Bone: No Epithelialization: None None Large (67-100%) Periwound Skin Texture: No Abnormalities Noted No Abnormalities Noted No Abnormalities Noted Periwound Skin Moisture: No Abnormalities Noted No Abnormalities Noted No Abnormalities Noted Periwound Skin Color: No Abnormalities Noted No Abnormalities Noted No Abnormalities Noted Erythema Location: N/A N/A N/A Temperature: No Abnormality No Abnormality No Abnormality Tenderness on Palpation: Yes Yes No Wound Preparation: Ulcer Cleansing: Ulcer Cleansing: Ulcer Cleansing: Rinsed/Irrigated with Saline Rinsed/Irrigated  with Saline Rinsed/Irrigated with Saline Topical Anesthetic Applied: Topical Anesthetic Applied: Topical Anesthetic Applied: Other: lidocaine 4% Other: lidocaine 4% None Ryan Leonard, Ryan Leonard ([id number]) Treatment Notes Electronic Signature(s) Signed: 07/27/2017 5:05:40 PM By: Baltazar Najjar MD Entered By: Baltazar Najjar on  07/27/2017 09:29:39 Ryan Leonard ([id number]) -------------------------------------------------------------------------------- Multi-Disciplinary Care Plan Details Patient Name: Ryan Leonard Date of Service: 07/27/2017 9:00 AM Medical Record Number: [id number] Patient Account Number: [id number] Date of Birth/Sex: November 24, 1975 (42 y.o. Male) Treating RN: Huel Coventry Primary Care Valeen Borys: Naoma Diener Other Clinician: Referring Annamae Shivley: Naoma Diener Treating Maral Lampe/Extender: Altamese [place] in Treatment: 2 Active Inactive ` Nutrition Nursing Diagnoses: Imbalanced nutrition Impaired glucose control: actual or potential Potential for alteratiion in Nutrition/Potential for imbalanced nutrition Goals: Patient/caregiver agrees to and verbalizes understanding of need to use nutritional supplements and/or vitamins as prescribed Date Initiated: 07/13/2017 Target Resolution Date: 11/12/2017 Goal Status: Active Patient/caregiver will maintain therapeutic glucose control Date Initiated: 07/13/2017 Target Resolution Date: 11/12/2017 Goal Status: Active Interventions: Assess patient nutrition upon admission and as needed per policy Provide education on elevated blood sugars and impact on wound healing Provide education on nutrition Treatment Activities: Education provided on Nutrition : 07/13/2017 Notes: ` Orientation to the Wound Care Program Nursing Diagnoses: Knowledge deficit related to the wound healing center program Goals: Patient/caregiver will verbalize understanding of the Wound Healing Center Program Date Initiated: 07/13/2017 Target Resolution Date: 08/13/2017 Goal Status: Active Interventions: Provide education on orientation to the wound center Notes: Ryan Leonard, Ryan Leonard ([id number]) Pain, Acute or Chronic Nursing Diagnoses: Pain, acute or chronic: actual or potential Potential alteration in comfort, pain Goals: Patient/caregiver will verbalize adequate pain  control between visits Date Initiated: 07/13/2017 Target Resolution Date: 11/12/2017 Goal Status: Active Interventions: Complete pain assessment as per visit requirements Encourage patient to take pain medications as prescribed Notes: ` Soft Tissue Infection Nursing Diagnoses: Impaired tissue integrity Knowledge deficit related to disease process and management Knowledge deficit related to home infection control: handwashing, handling of soiled dressings, supply storage Potential for infection: soft tissue Goals: Patient will remain free of wound infection Date Initiated: 07/13/2017 Target Resolution Date: 11/12/2017 Goal Status: Active Patient/caregiver will verbalize understanding of or measures to prevent infection and contamination in the home setting Date Initiated: 07/13/2017 Target Resolution Date: 11/12/2017 Goal Status: Active Interventions: Assess signs and symptoms of infection every visit Provide education on infection Treatment Activities: Education provided on Infection : 07/13/2017 Notes: ` Wound/Skin Impairment Nursing Diagnoses: Impaired tissue integrity Knowledge deficit related to ulceration/compromised skin integrity Goals: Ulcer/skin breakdown will have a volume reduction of 80% by week 12 Date Initiated: 07/13/2017 Target Resolution Date: 11/12/2017 Ryan Leonard, Ryan Leonard ([id number]) Goal Status: Active Interventions: Assess patient/caregiver ability to perform ulcer/skin care regimen upon admission and as needed Assess ulceration(s) every visit Notes: Electronic Signature(s) Signed: 07/27/2017 5:24:42 PM By: Elliot Gurney, BSN, RN, CWS, Kim RN, BSN Entered By: Elliot Gurney, BSN, RN, CWS, Kim on 07/27/2017 09:21:09 Ryan Leonard ([id number]) -------------------------------------------------------------------------------- Pain Assessment Details Patient Name: Ryan Leonard Date of Service: 07/27/2017 9:00 AM Medical Record Number: [id number] Patient Account Number: [id number] Date  of Birth/Sex: 1975-12-31 (42 y.o. Male) Treating RN: Phillis Haggis Primary Care Bridgette Wolden: Naoma Diener Other Clinician: Referring Robertta Halfhill: Naoma Diener Treating Karra Pink/Extender: Maxwell Caul Weeks in Treatment: 2 Active Problems Location of Pain Severity and Description of Pain Patient Has Paino No Site Locations Pain Management and Medication Current Pain Management: Electronic Signature(s) Signed: 07/29/2017 8:00:35 AM By: Alejandro Mulling Entered By: Alejandro Mulling on 07/27/2017 08:58:30 Ryan Leonard ([id number]) -------------------------------------------------------------------------------- Patient/Caregiver Education Details Patient Name:  Ryan ChurchWILLIAMS, Ryan Leonard Date of Service: 07/27/2017 9:00 AM Medical Record Number: [id number] Patient Account Number: [id number] Date of Birth/Gender: December 07, 1975 [age] y.o. Male) Treating RN: Curtis Sitesorthy, Joanna Primary Care Physician: Naoma DienerKOMIVES, EUGENIE Other Clinician: Referring Physician: Naoma DienerKOMIVES, EUGENIE Treating Physician/Extender: Altamese CarolinaOBSON, MICHAEL G Weeks in Treatment: 2 Education Assessment Education Provided To: Patient Education Topics Provided Wound/Skin Impairment: Handouts: Other: wound care to continue as ordered Methods: Demonstration, Explain/Verbal Responses: State content correctly Electronic Signature(s) Signed: 07/27/2017 4:58:37 PM By: Curtis Sitesorthy, Joanna Entered By: Curtis Sitesorthy, Joanna on 07/27/2017 09:35:47 Ryan ChurchWILLIAMS, Ryan Leonard ([id number]) -------------------------------------------------------------------------------- Wound Assessment Details Patient Name: Ryan ChurchWILLIAMS, Ryan Leonard Date of Service: 07/27/2017 9:00 AM Medical Record Number: [id number] Patient Account Number: [id number] Date of Birth/Sex: December 07, 1975 [age] y.o. Male) Treating RN: Ashok CordiaPinkerton, Debi Primary Care Nobie Alleyne: Naoma DienerKOMIVES, EUGENIE Other Clinician: Referring Akilah Cureton: Naoma DienerKOMIVES, EUGENIE Treating Chade Pitner/Extender: Maxwell CaulOBSON, MICHAEL G Weeks in Treatment: 2 Wound  Status Wound Number: 1 Primary Diabetic Wound/Ulcer of the Lower Extremity Etiology: Wound Location: Right Foot - Dorsal, Distal Wound Status: Open Wounding Event: Blister Comorbid Asthma, Hypertension, Type II Diabetes, Date Acquired: 07/09/2017 History: Neuropathy Weeks Of Treatment: 2 Clustered Wound: No Pending Amputation On Presentation Photos Photo Uploaded By: Alejandro MullingPinkerton, Debra on 07/27/2017 09:33:05 Wound Measurements Length: (cm) 2.8 Width: (cm) 2.7 Depth: (cm) 0.1 Area: (cm) 5.938 Volume: (cm) 0.594 % Reduction in Area: 58% % Reduction in Volume: 58% Epithelialization: None Tunneling: No Undermining: No Wound Description Classification: Grade 1 Wound Margin: Distinct, outline attached Exudate Amount: Large Exudate Type: Serous Exudate Color: amber Foul Odor After Cleansing: No Slough/Fibrino Yes Wound Bed Granulation Amount: Large (67-100%) Exposed Structure Granulation Quality: Red Fascia Exposed: No Necrotic Amount: Small (1-33%) Fat Layer (Subcutaneous Tissue) Exposed: No Necrotic Quality: Adherent Slough Tendon Exposed: No Muscle Exposed: No Joint Exposed: No Bone Exposed: No Periwound Skin Texture Ryan Leonard, Ryan Leonard ([id number]) Texture Color No Abnormalities Noted: No No Abnormalities Noted: No Erythema: Yes Moisture Erythema Location: Circumferential No Abnormalities Noted: No Maceration: No Temperature / Pain Temperature: Hot Tenderness on Palpation: Yes Wound Preparation Ulcer Cleansing: Rinsed/Irrigated with Saline Topical Anesthetic Applied: Other: lidocaine 4%, Treatment Notes Wound #1 (Right, Distal, Dorsal Foot) 1. Cleansed with: Clean wound with Normal Saline 2. Anesthetic Topical Lidocaine 4% cream to wound bed prior to debridement 4. Dressing Applied: Other dressing (specify in notes) 5. Secondary Dressing Applied ABD Pad Kerlix/Conform 7. Secured with Tape Notes silvercel, stretch netting, coverlet on  fingers Electronic Signature(s) Signed: 07/29/2017 8:00:35 AM By: Alejandro MullingPinkerton, Debra Entered By: Alejandro MullingPinkerton, Debra on 07/27/2017 09:06:58 Ryan ChurchWILLIAMS, Iann ([id number]) -------------------------------------------------------------------------------- Wound Assessment Details Patient Name: Ryan ChurchWILLIAMS, Brayden Date of Service: 07/27/2017 9:00 AM Medical Record Number: [id number] Patient Account Number: [id number] Date of Birth/Sex: December 07, 1975 [age] y.o. Male) Treating RN: Ashok CordiaPinkerton, Debi Primary Care Mazi Schuff: Naoma DienerKOMIVES, EUGENIE Other Clinician: Referring Bellatrix Devonshire: Naoma DienerKOMIVES, EUGENIE Treating Rosa Wyly/Extender: Maxwell CaulOBSON, MICHAEL G Weeks in Treatment: 2 Wound Status Wound Number: 2 Primary Diabetic Wound/Ulcer of the Lower Etiology: Extremity Wound Location: Right Foot - Dorsal, Proximal Wound Status: Open Wounding Event: Gradually Appeared Comorbid Asthma, Hypertension, Type II Diabetes, Date Acquired: 06/29/2017 History: Neuropathy Weeks Of Treatment: 2 Clustered Wound: No Pending Amputation On Presentation Photos Photo Uploaded By: Alejandro MullingPinkerton, Debra on 07/27/2017 09:33:05 Wound Measurements Length: (cm) 1 Width: (cm) 0.7 Depth: (cm) 0.1 Area: (cm) 0.55 Volume: (cm) 0.055 % Reduction in Area: 22.2% % Reduction in Volume: 22.5% Epithelialization: None Tunneling: No Undermining: No Wound Description Classification: Grade 2 Wound Margin: Distinct, outline attached Exudate Amount: Large Exudate Type: Serous Exudate Color: amber Foul Odor After Cleansing: No Slough/Fibrino Yes Wound Bed Granulation Amount:  Large (67-100%) Exposed Structure Granulation Quality: Pink Fascia Exposed: No Necrotic Amount: Small (1-33%) Fat Layer (Subcutaneous Tissue) Exposed: No Necrotic Quality: Adherent Slough Tendon Exposed: No Muscle Exposed: No Joint Exposed: No Bone Exposed: No Periwound Skin Texture Karner, Jahaziel ([id number]) Texture Color No Abnormalities Noted: No No Abnormalities Noted:  No Erythema: Yes Moisture Erythema Location: Circumferential No Abnormalities Noted: No Temperature / Pain Temperature: Hot Tenderness on Palpation: Yes Wound Preparation Ulcer Cleansing: Rinsed/Irrigated with Saline Topical Anesthetic Applied: Other: lidocaine 4%, Treatment Notes Wound #2 (Right, Proximal, Dorsal Foot) 1. Cleansed with: Clean wound with Normal Saline 2. Anesthetic Topical Lidocaine 4% cream to wound bed prior to debridement 4. Dressing Applied: Other dressing (specify in notes) 5. Secondary Dressing Applied ABD Pad Kerlix/Conform 7. Secured with Tape Notes silvercel, stretch netting, coverlet on fingers Electronic Signature(s) Signed: 07/29/2017 8:00:35 AM By: Alejandro Mulling Entered By: Alejandro Mulling on 07/27/2017 09:07:40 Ryan Leonard ([id number]) -------------------------------------------------------------------------------- Wound Assessment Details Patient Name: Ryan Leonard Date of Service: 07/27/2017 9:00 AM Medical Record Number: [id number] Patient Account Number: [id number] Date of Birth/Sex: May 07, 1976 (42 y.o. Male) Treating RN: Ashok Cordia, Debi Primary Care Wille Aubuchon: Naoma Diener Other Clinician: Referring Dezire Turk: Naoma Diener Treating Lajuan Godbee/Extender: Maxwell Caul Weeks in Treatment: 2 Wound Status Wound Number: 3 Primary Diabetic Wound/Ulcer of the Lower Etiology: Extremity Wound Location: Right Lower Leg Wound Status: Open Wounding Event: Gradually Appeared Comorbid Asthma, Hypertension, Type II Diabetes, Date Acquired: 06/29/2017 History: Neuropathy Weeks Of Treatment: 2 Clustered Wound: No Pending Amputation On Presentation Photos Photo Uploaded By: Alejandro Mulling on 07/27/2017 09:33:35 Wound Measurements Length: (cm) 1.7 Width: (cm) 2 Depth: (cm) 0.1 Area: (cm) 2.67 Volume: (cm) 0.267 % Reduction in Area: 9.1% % Reduction in Volume: 9.2% Epithelialization: None Tunneling: No Undermining:  No Wound Description Classification: Grade 2 Wound Margin: Distinct, outline attached Exudate Amount: Large Exudate Type: Serous Exudate Color: amber Foul Odor After Cleansing: No Slough/Fibrino Yes Wound Bed Granulation Amount: Large (67-100%) Exposed Structure Granulation Quality: Red Fascia Exposed: No Necrotic Amount: Small (1-33%) Fat Layer (Subcutaneous Tissue) Exposed: No Necrotic Quality: Adherent Slough Tendon Exposed: No Muscle Exposed: No Joint Exposed: No Bone Exposed: No Periwound Skin Texture Ryan Leonard, Ryan Leonard ([id number]) Texture Color No Abnormalities Noted: No No Abnormalities Noted: No Moisture Temperature / Pain No Abnormalities Noted: No Temperature: No Abnormality Tenderness on Palpation: Yes Wound Preparation Ulcer Cleansing: Rinsed/Irrigated with Saline Topical Anesthetic Applied: Other: lidocaine 4%, Treatment Notes Wound #3 (Right Lower Leg) 1. Cleansed with: Clean wound with Normal Saline 2. Anesthetic Topical Lidocaine 4% cream to wound bed prior to debridement 4. Dressing Applied: Other dressing (specify in notes) 5. Secondary Dressing Applied ABD Pad Kerlix/Conform 7. Secured with Tape Notes silvercel, stretch netting, coverlet on fingers Electronic Signature(s) Signed: 07/29/2017 8:00:35 AM By: Alejandro Mulling Entered By: Alejandro Mulling on 07/27/2017 09:09:21 Ryan Leonard ([id number]) -------------------------------------------------------------------------------- Wound Assessment Details Patient Name: Ryan Leonard Date of Service: 07/27/2017 9:00 AM Medical Record Number: [id number] Patient Account Number: [id number] Date of Birth/Sex: 09-23-1975 (42 y.o. Male) Treating RN: Phillis Haggis Primary Care Jaevon Paras: Naoma Diener Other Clinician: Referring Anacristina Steffek: Naoma Diener Treating Cerys Winget/Extender: Maxwell Caul Weeks in Treatment: 2 Wound Status Wound Number: 4 Primary Trauma, Other Etiology: Wound  Location: Right Hand - 2nd Digit Wound Status: Open Wounding Event: Trauma Comorbid Asthma, Hypertension, Type II Diabetes, Date Acquired: 06/29/2017 History: Neuropathy Weeks Of Treatment: 2 Clustered Wound: No Pending Amputation On Presentation Photos Photo Uploaded By: Alejandro Mulling on 07/27/2017 09:33:35 Wound Measurements Length: (cm) 0.4 Width: (cm) 0.4 Depth: (cm)  0.1 Area: (cm) 0.126 Volume: (cm) 0.013 % Reduction in Area: 46.6% % Reduction in Volume: 45.8% Epithelialization: None Tunneling: No Undermining: No Wound Description Full Thickness Without Exposed Support Classification: Structures Wound Margin: Distinct, outline attached Exudate Medium Amount: Exudate Type: Serous Exudate Color: amber Foul Odor After Cleansing: No Slough/Fibrino Yes Wound Bed Granulation Amount: None Present (0%) Exposed Structure Necrotic Amount: Large (67-100%) Fascia Exposed: No Necrotic Quality: Eschar, Adherent Slough Fat Layer (Subcutaneous Tissue) Exposed: No Tendon Exposed: No Muscle Exposed: No Joint Exposed: No Ryan Leonard, Ryan Leonard ([id number]) Bone Exposed: No Periwound Skin Texture Texture Color No Abnormalities Noted: No No Abnormalities Noted: No Moisture Temperature / Pain No Abnormalities Noted: No Temperature: No Abnormality Tenderness on Palpation: Yes Wound Preparation Ulcer Cleansing: Rinsed/Irrigated with Saline Topical Anesthetic Applied: Other: lidocaine 4%, Treatment Notes Wound #4 (Right Hand - 2nd Digit) 1. Cleansed with: Clean wound with Normal Saline 2. Anesthetic Topical Lidocaine 4% cream to wound bed prior to debridement 4. Dressing Applied: Other dressing (specify in notes) 5. Secondary Dressing Applied ABD Pad Kerlix/Conform 7. Secured with Tape Notes silvercel, stretch netting, coverlet on fingers Electronic Signature(s) Signed: 07/29/2017 8:00:35 AM By: Alejandro Mulling Entered By: Alejandro Mulling on 07/27/2017  09:10:22 Ryan Leonard ([id number]) -------------------------------------------------------------------------------- Wound Assessment Details Patient Name: Ryan Leonard Date of Service: 07/27/2017 9:00 AM Medical Record Number: [id number] Patient Account Number: [id number] Date of Birth/Sex: 1976/01/14 (42 y.o. Male) Treating RN: Ashok Cordia, Debi Primary Care Erhard Senske: Naoma Diener Other Clinician: Referring Pamlea Finder: Naoma Diener Treating Orli Degrave/Extender: Maxwell Caul Weeks in Treatment: 2 Wound Status Wound Number: 5 Primary Trauma, Other Etiology: Wound Location: Right Hand - 3rd Digit Wound Status: Open Wounding Event: Trauma Comorbid Asthma, Hypertension, Type II Diabetes, Date Acquired: 06/29/2017 History: Neuropathy Weeks Of Treatment: 2 Clustered Wound: No Pending Amputation On Presentation Photos Photo Uploaded By: Alejandro Mulling on 07/27/2017 09:34:03 Wound Measurements Length: (cm) 0.8 Width: (cm) 0.9 Depth: (cm) 0.1 Area: (cm) 0.565 Volume: (cm) 0.057 % Reduction in Area: 80% % Reduction in Volume: 79.9% Epithelialization: None Tunneling: No Undermining: No Wound Description Full Thickness Without Exposed Support Classification: Structures Wound Margin: Distinct, outline attached Exudate Large Amount: Exudate Type: Serous Exudate Color: amber Foul Odor After Cleansing: No Slough/Fibrino Yes Wound Bed Granulation Amount: Medium (34-66%) Exposed Structure Granulation Quality: Pink Fascia Exposed: No Necrotic Amount: Medium (34-66%) Fat Layer (Subcutaneous Tissue) Exposed: No Necrotic Quality: Adherent Slough Tendon Exposed: No Muscle Exposed: No Joint Exposed: No Ryan Leonard, Ryan Leonard ([id number]) Bone Exposed: No Periwound Skin Texture Texture Color No Abnormalities Noted: No No Abnormalities Noted: No Moisture Temperature / Pain No Abnormalities Noted: No Temperature: No Abnormality Tenderness on Palpation: Yes Wound  Preparation Ulcer Cleansing: Rinsed/Irrigated with Saline Topical Anesthetic Applied: Other: lidocaine 4%, Treatment Notes Wound #5 (Right Hand - 3rd Digit) 1. Cleansed with: Clean wound with Normal Saline 2. Anesthetic Topical Lidocaine 4% cream to wound bed prior to debridement 4. Dressing Applied: Other dressing (specify in notes) 5. Secondary Dressing Applied ABD Pad Kerlix/Conform 7. Secured with Tape Notes silvercel, stretch netting, coverlet on fingers Electronic Signature(s) Signed: 07/29/2017 8:00:35 AM By: Alejandro Mulling Entered By: Alejandro Mulling on 07/27/2017 09:12:52 Ryan Leonard ([id number]) -------------------------------------------------------------------------------- Wound Assessment Details Patient Name: Ryan Leonard Date of Service: 07/27/2017 9:00 AM Medical Record Number: [id number] Patient Account Number: [id number] Date of Birth/Sex: 09-Mar-1976 (42 y.o. Male) Treating RN: Ashok Cordia, Debi Primary Care Tyeasha Ebbs: Naoma Diener Other Clinician: Referring Keelin Sheridan: Naoma Diener Treating Lenorris Karger/Extender: Maxwell Caul Weeks in Treatment: 2 Wound Status Wound Number: 6 Primary Trauma, Other  Etiology: Wound Location: Right Hand - 4th Digit Wound Status: Healed - Epithelialized Wounding Event: Trauma Comorbid Asthma, Hypertension, Type II Diabetes, Date Acquired: 06/29/2017 History: Neuropathy Weeks Of Treatment: 2 Clustered Wound: No Pending Amputation On Presentation Photos Photo Uploaded By: Alejandro Mulling on 07/27/2017 09:34:03 Wound Measurements Length: (cm) 0 % Re Width: (cm) 0 % Re Depth: (cm) 0 Epit Area: (cm) 0 Tun Volume: (cm) 0 Und duction in Area: 100% duction in Volume: 100% helialization: Large (67-100%) neling: No ermining: No Wound Description Full Thickness Without Exposed Support Classification: Structures Wound Margin: Distinct, outline attached Exudate None Present Amount: Foul Odor After  Cleansing: No Slough/Fibrino No Wound Bed Granulation Amount: None Present (0%) Exposed Structure Necrotic Amount: None Present (0%) Fascia Exposed: No Fat Layer (Subcutaneous Tissue) Exposed: No Tendon Exposed: No Muscle Exposed: No Joint Exposed: No Bone Exposed: No Periwound Skin Texture Brege, Kaio ([id number]) Texture Color No Abnormalities Noted: No No Abnormalities Noted: No Moisture Temperature / Pain No Abnormalities Noted: No Temperature: No Abnormality Wound Preparation Ulcer Cleansing: Rinsed/Irrigated with Saline Topical Anesthetic Applied: None Electronic Signature(s) Signed: 07/29/2017 8:00:35 AM By: Alejandro Mulling Entered By: Alejandro Mulling on 07/27/2017 09:11:12 Ryan Leonard ([id number]) -------------------------------------------------------------------------------- Vitals Details Patient Name: Ryan Leonard Date of Service: 07/27/2017 9:00 AM Medical Record Number: [id number] Patient Account Number: [id number] Date of Birth/Sex: 1976-05-25 (42 y.o. Male) Treating RN: Ashok Cordia, Debi Primary Care Lason Eveland: Naoma Diener Other Clinician: Referring Colten Desroches: Naoma Diener Treating Shalana Jardin/Extender: Maxwell Caul Weeks in Treatment: 2 Vital Signs Time Taken: 09:00 Temperature (F): 98.1 Height (in): 72 Pulse (bpm): 75 Weight (lbs): 226.5 Respiratory Rate (breaths/min): 20 Body Mass Index (BMI): 30.7 Blood Pressure (mmHg): 163/89 Reference Range: 80 - 120 mg / dl Electronic Signature(s) Signed: 07/29/2017 8:00:35 AM By: Alejandro Mulling Entered By: Alejandro Mulling on 07/27/2017 09:00:48

## 2017-08-03 ENCOUNTER — Encounter: Payer: Managed Care, Other (non HMO) | Admitting: Internal Medicine

## 2017-08-03 DIAGNOSIS — E11621 Type 2 diabetes mellitus with foot ulcer: Secondary | ICD-10-CM | POA: Diagnosis not present

## 2017-08-04 NOTE — Progress Notes (Signed)
TALAN, GILDNER ([id number]) Visit Report for 08/03/2017 Arrival Information Details Patient Name: Ryan Leonard, Ryan Leonard Date of Service: 08/03/2017 9:15 AM Medical Record Number: [id number] Patient Account Number: [id number] Date of Birth/Sex: June 03, 1976 (42 y.o. Male) Treating RN: Phillis Haggis Primary Care Jame Morrell: Naoma Diener Other Clinician: Referring Zyonna Vardaman: Naoma Diener Treating Bulah Lurie/Extender: Altamese Cassel in Treatment: 3 Visit Information History Since Last Visit All ordered tests and consults were completed: No Patient Arrived: Ambulatory Added or deleted any medications: No Arrival Time: 09:22 Any new allergies or adverse reactions: No Accompanied By: wife, son Had a fall or experienced change in No Transfer Assistance: None activities of daily living that may affect Patient Identification Verified: Yes risk of falls: Secondary Verification Process Completed: Yes Signs or symptoms of abuse/neglect since last visito No Patient Requires Transmission-Based No Hospitalized since last visit: No Precautions: Has Dressing in Place as Prescribed: Yes Patient Has Alerts: Yes Pain Present Now: Yes Patient Alerts: DM II Electronic Signature(s) Signed: 08/03/2017 4:30:35 PM By: Alejandro Mulling Entered By: Alejandro Mulling on 08/03/2017 09:24:08 Ryan Leonard ([id number]) -------------------------------------------------------------------------------- Encounter Discharge Information Details Patient Name: Ryan Leonard Date of Service: 08/03/2017 9:15 AM Medical Record Number: [id number] Patient Account Number: [id number] Date of Birth/Sex: January 31, 1976 (42 y.o. Male) Treating RN: Huel Coventry Primary Care Joannie Medine: Naoma Diener Other Clinician: Referring Natori Gudino: Naoma Diener Treating Aerie Donica/Extender: Altamese Saltillo in Treatment: 3 Encounter Discharge Information Items Discharge Pain Level: 0 Discharge Condition: Stable Ambulatory  Status: Ambulatory Discharge Destination: Home Transportation: Private Auto Accompanied By: spouse Schedule Follow-up Appointment: Yes Medication Reconciliation completed and No provided to Patient/Care Kevona Lupinacci: Provided on Clinical Summary of Care: 08/03/2017 Form Type Recipient Paper Patient BW Electronic Signature(s) Signed: 08/03/2017 10:10:40 AM By: Curtis Sites Entered By: Curtis Sites on 08/03/2017 10:10:40 Ryan Leonard ([id number]) -------------------------------------------------------------------------------- Lower Extremity Assessment Details Patient Name: Ryan Leonard Date of Service: 08/03/2017 9:15 AM Medical Record Number: [id number] Patient Account Number: [id number] Date of Birth/Sex: 17-Sep-1975 (42 y.o. Male) Treating RN: Phillis Haggis Primary Care Ellsworth Waldschmidt: Naoma Diener Other Clinician: Referring Adelise Buswell: Naoma Diener Treating Stefanos Haynesworth/Extender: Maxwell Caul Weeks in Treatment: 3 Vascular Assessment Pulses: Dorsalis Pedis Palpable: [Right:Yes] Posterior Tibial Extremity colors, hair growth, and conditions: Extremity Color: [Right:Hyperpigmented] Temperature of Extremity: [Right:Warm] Capillary Refill: [Right:< 3 seconds] Toe Nail Assessment Left: Right: Thick: No Discolored: No Deformed: No Improper Length and Hygiene: No Electronic Signature(s) Signed: 08/03/2017 4:30:35 PM By: Alejandro Mulling Entered By: Alejandro Mulling on 08/03/2017 09:41:50 Ryan Leonard ([id number]) -------------------------------------------------------------------------------- Multi Wound Chart Details Patient Name: Ryan Leonard Date of Service: 08/03/2017 9:15 AM Medical Record Number: [id number] Patient Account Number: [id number] Date of Birth/Sex: [birth year] (42 y.o. Male) Treating RN: Huel Coventry Primary Care Uzair Godley: Naoma Diener Other Clinician: Referring Nihira Puello: Naoma Diener Treating Marian Meneely/Extender: Maxwell Caul Weeks in  Treatment: 3 Vital Signs Height(in): 72 Pulse(bpm): 86 Weight(lbs): 226.5 Blood Pressure(mmHg): 164/90 Body Mass Index(BMI): 31 Temperature(F): 97.9 Respiratory Rate 18 (breaths/min): Photos: [1:No Photos] [2:No Photos] [3:No Photos] Wound Location: [1:Right Foot - Dorsal, Distal] [2:Right Foot - Dorsal, Proximal] [3:Right Lower Leg] Wounding Event: [1:Blister] [2:Gradually Appeared] [3:Gradually Appeared] Primary Etiology: [1:Diabetic Wound/Ulcer of the Lower Extremity] [2:Diabetic Wound/Ulcer of the Lower Extremity] [3:Diabetic Wound/Ulcer of the Lower Extremity] Comorbid History: [1:Asthma, Hypertension, Type II Diabetes, Neuropathy] [2:Asthma, Hypertension, Type II Diabetes, Neuropathy] [3:Asthma, Hypertension, Type II Diabetes, Neuropathy] Date Acquired: [1:07/09/2017] [2:06/29/2017] [3:06/29/2017] Weeks of Treatment: [1:3] [2:3] [3:3] Wound Status: [1:Open] [2:Open] [3:Open] Pending Amputation on [1:Yes] [2:Yes] [3:Yes] Presentation: Measurements L x W x D [1:2.5x2.8x0.1] [2:0.9x0.5x0.1] [3:1.5x2x0.1] (cm)  Area (cm) : [1:5.498] [2:0.353] [3:2.356] Volume (cm) : [1:0.55] [2:0.035] [3:0.236] % Reduction in Area: [1:61.10%] [2:50.10%] [3:19.80%] % Reduction in Volume: [1:61.10%] [2:50.70%] [3:19.70%] Classification: [1:Grade 1] [2:Grade 2] [3:Grade 2] Exudate Amount: [1:Small] [2:Large] [3:Large] Exudate Type: [1:Serous] [2:Serous] [3:Serous] Exudate Color: [1:amber] [2:amber] [3:amber] Wound Margin: [1:Distinct, outline attached] [2:Distinct, outline attached] [3:Distinct, outline attached] Granulation Amount: [1:Small (1-33%)] [2:Large (67-100%)] [3:Large (67-100%)] Granulation Quality: [1:Red] [2:Pink] [3:Red] Necrotic Amount: [1:Large (67-100%)] [2:Small (1-33%)] [3:Small (1-33%)] Necrotic Tissue: [1:Eschar, Adherent Slough] [2:Adherent Slough] [3:Adherent Slough] Exposed Structures: [1:Fascia: No Fat Layer (Subcutaneous Tissue) Exposed: No Tendon: No Muscle: No Joint:  No Bone: No] [2:Fascia: No Fat Layer (Subcutaneous Tissue) Exposed: No Tendon: No Muscle: No Joint: No Bone: No] [3:Fascia: No Fat Layer (Subcutaneous Tissue) Exposed:  No Tendon: No Muscle: No Joint: No Bone: No] Epithelialization: [1:None] [2:None] [3:None] Debridement: [1:Open Wound/Selective ([id number]) - Selective] [2:N/A] [3:N/A] Pre-procedure 09:50 N/A N/A Verification/Time Out Taken: Pain Control: Lidocaine 4% Topical Solution N/A N/A Tissue Debrided: Fibrin/Slough, Skin N/A N/A Level: Non-Viable Tissue N/A N/A Debridement Area (sq cm): 7 N/A N/A Instrument: Blade, Forceps N/A N/A Bleeding: Minimum N/A N/A Hemostasis Achieved: Pressure N/A N/A Procedural Pain: 0 N/A N/A Post Procedural Pain: 0 N/A N/A Debridement Treatment Procedure was tolerated well N/A N/A Response: Post Debridement 2.5x2.8x0.2 N/A N/A Measurements L x W x D (cm) Post Debridement Volume: 1.1 N/A N/A (cm) Periwound Skin Texture: No Abnormalities Noted No Abnormalities Noted No Abnormalities Noted Periwound Skin Moisture: Maceration: No No Abnormalities Noted No Abnormalities Noted Periwound Skin Color: Erythema: Yes Erythema: Yes No Abnormalities Noted Erythema Location: Circumferential Circumferential N/A Temperature: Hot Hot No Abnormality Tenderness on Palpation: Yes Yes Yes Wound Preparation: Ulcer Cleansing: Ulcer Cleansing: Ulcer Cleansing: Rinsed/Irrigated with Saline Rinsed/Irrigated with Saline Rinsed/Irrigated with Saline Topical Anesthetic Applied: Topical Anesthetic Applied: Topical Anesthetic Applied: Other: lidocaine 4% Other: lidocaine 4% Other: lidocaine 4% Procedures Performed: Debridement N/A N/A Wound Number: 4 5 N/A Photos: No Photos No Photos N/A Wound Location: Right Hand - 2nd Digit Right Hand - 3rd Digit N/A Wounding Event: Trauma Trauma N/A Primary Etiology: Trauma, Other Trauma, Other N/A Comorbid History: Asthma, Hypertension, Type II Asthma, Hypertension, Type II  N/A Diabetes, Neuropathy Diabetes, Neuropathy Date Acquired: 06/29/2017 06/29/2017 N/A Weeks of Treatment: 3 3 N/A Wound Status: Open Open N/A Pending Amputation on Yes Yes N/A Presentation: Measurements L x W x D 0.4x0.3x0.1 0.5x0.6x0.1 N/A (cm) Area (cm) : 0.094 0.236 N/A Volume (cm) : 0.009 0.024 N/A % Reduction in Area: 60.20% 91.70% N/A % Reduction in Volume: 62.50% 91.50% N/A Classification: Full Thickness Without Full Thickness Without N/A Exposed Support Structures Exposed Support Structures Exudate Amount: None Present None Present N/A Exudate Type: N/A N/A N/A Exudate Color: N/A N/A N/A Wound Margin: Distinct, outline attached Distinct, outline attached N/A Granulation Amount: None Present (0%) None Present (0%) N/A Granulation Quality: N/A N/A N/A JEKHI, BOLIN ([id number]) Necrotic Amount: Large (67-100%) Large (67-100%) N/A Necrotic Tissue: Eschar Eschar N/A Exposed Structures: Fascia: No Fascia: No N/A Fat Layer (Subcutaneous Fat Layer (Subcutaneous Tissue) Exposed: No Tissue) Exposed: No Tendon: No Tendon: No Muscle: No Muscle: No Joint: No Joint: No Bone: No Bone: No Epithelialization: None None N/A Debridement: N/A N/A N/A Pain Control: N/A N/A N/A Tissue Debrided: N/A N/A N/A Level: N/A N/A N/A Debridement Area (sq cm): N/A N/A N/A Instrument: N/A N/A N/A Bleeding: N/A N/A N/A Hemostasis Achieved: N/A N/A N/A Procedural Pain: N/A N/A N/A Post Procedural Pain: N/A N/A N/A Debridement Treatment N/A N/A  N/A Response: Post Debridement N/A N/A N/A Measurements L x W x D (cm) Post Debridement Volume: N/A N/A N/A (cm) Periwound Skin Texture: No Abnormalities Noted No Abnormalities Noted N/A Periwound Skin Moisture: No Abnormalities Noted No Abnormalities Noted N/A Periwound Skin Color: No Abnormalities Noted No Abnormalities Noted N/A Erythema Location: N/A N/A N/A Temperature: No Abnormality No Abnormality N/A Tenderness on Palpation: Yes  Yes N/A Wound Preparation: Ulcer Cleansing: Ulcer Cleansing: N/A Rinsed/Irrigated with Saline Rinsed/Irrigated with Saline Topical Anesthetic Applied: Topical Anesthetic Applied: Other: lidocaine 4% Other: lidocaine 4% Procedures Performed: N/A N/A N/A Treatment Notes Wound #1 (Right, Distal, Dorsal Foot) 1. Cleansed with: Clean wound with Normal Saline 2. Anesthetic Topical Lidocaine 4% cream to wound bed prior to debridement 4. Dressing Applied: Other dressing (specify in notes) 5. Secondary Dressing Applied ABD Pad Kerlix/Conform 7. Secured with Tape Notes silvercel, stretch netting, coverlet on fingers Ryan Leonard, Ryan Leonard ([id number]) Wound #2 (Right, Proximal, Dorsal Foot) 1. Cleansed with: Clean wound with Normal Saline 2. Anesthetic Topical Lidocaine 4% cream to wound bed prior to debridement 4. Dressing Applied: Other dressing (specify in notes) 5. Secondary Dressing Applied ABD Pad Kerlix/Conform 7. Secured with Tape Notes silvercel, stretch netting, coverlet on fingers Wound #3 (Right Lower Leg) 1. Cleansed with: Clean wound with Normal Saline 2. Anesthetic Topical Lidocaine 4% cream to wound bed prior to debridement 4. Dressing Applied: Other dressing (specify in notes) 5. Secondary Dressing Applied ABD Pad Kerlix/Conform 7. Secured with Tape Notes silvercel, stretch netting, coverlet on fingers Wound #4 (Right Hand - 2nd Digit) 1. Cleansed with: Clean wound with Normal Saline 2. Anesthetic Topical Lidocaine 4% cream to wound bed prior to debridement 4. Dressing Applied: Other dressing (specify in notes) 5. Secondary Dressing Applied ABD Pad Kerlix/Conform 7. Secured with Tape Notes silvercel, stretch netting, coverlet on fingers Wound #5 (Right Hand - 3rd Digit) 1. Cleansed with: Clean wound with Normal Saline 2. Anesthetic Topical Lidocaine 4% cream to wound bed prior to debridement 4. Dressing Applied: Ryan Leonard, Ryan Leonard  ([id number]) Other dressing (specify in notes) 5. Secondary Dressing Applied ABD Pad Kerlix/Conform 7. Secured with Tape Notes silvercel, stretch netting, coverlet on fingers Electronic Signature(s) Signed: 08/03/2017 5:07:14 PM By: Baltazar Najjar MD Entered By: Baltazar Najjar on 08/03/2017 10:20:09 Ryan Leonard ([id number]) -------------------------------------------------------------------------------- Multi-Disciplinary Care Plan Details Patient Name: Ryan Leonard Date of Service: 08/03/2017 9:15 AM Medical Record Number: [id number] Patient Account Number: [id number] Date of Birth/Sex: 08-24-1975 (42 y.o. Male) Treating RN: Huel Coventry Primary Care Roseanna Koplin: Naoma Diener Other Clinician: Referring Raguel Kosloski: Naoma Diener Treating Ehab Humber/Extender: Altamese Garnet in Treatment: 3 Active Inactive ` Nutrition Nursing Diagnoses: Imbalanced nutrition Impaired glucose control: actual or potential Potential for alteratiion in Nutrition/Potential for imbalanced nutrition Goals: Patient/caregiver agrees to and verbalizes understanding of need to use nutritional supplements and/or vitamins as prescribed Date Initiated: 07/13/2017 Target Resolution Date: 11/12/2017 Goal Status: Active Patient/caregiver will maintain therapeutic glucose control Date Initiated: 07/13/2017 Target Resolution Date: 11/12/2017 Goal Status: Active Interventions: Assess patient nutrition upon admission and as needed per policy Provide education on elevated blood sugars and impact on wound healing Provide education on nutrition Treatment Activities: Education provided on Nutrition : 07/13/2017 Notes: ` Orientation to the Wound Care Program Nursing Diagnoses: Knowledge deficit related to the wound healing center program Goals: Patient/caregiver will verbalize understanding of the Wound Healing Center Program Date Initiated: 07/13/2017 Target Resolution Date: 08/13/2017 Goal Status:  Active Interventions: Provide education on orientation to the wound center Notes: Ryan Leonard, Ryan Leonard ([id number]) Pain, Acute or  Chronic Nursing Diagnoses: Pain, acute or chronic: actual or potential Potential alteration in comfort, pain Goals: Patient/caregiver will verbalize adequate pain control between visits Date Initiated: 07/13/2017 Target Resolution Date: 11/12/2017 Goal Status: Active Interventions: Complete pain assessment as per visit requirements Encourage patient to take pain medications as prescribed Notes: ` Soft Tissue Infection Nursing Diagnoses: Impaired tissue integrity Knowledge deficit related to disease process and management Knowledge deficit related to home infection control: handwashing, handling of soiled dressings, supply storage Potential for infection: soft tissue Goals: Patient will remain free of wound infection Date Initiated: 07/13/2017 Target Resolution Date: 11/12/2017 Goal Status: Active Patient/caregiver will verbalize understanding of or measures to prevent infection and contamination in the home setting Date Initiated: 07/13/2017 Target Resolution Date: 11/12/2017 Goal Status: Active Interventions: Assess signs and symptoms of infection every visit Provide education on infection Treatment Activities: Education provided on Infection : 07/13/2017 Notes: ` Wound/Skin Impairment Nursing Diagnoses: Impaired tissue integrity Knowledge deficit related to ulceration/compromised skin integrity Goals: Ulcer/skin breakdown will have a volume reduction of 80% by week 12 Date Initiated: 07/13/2017 Target Resolution Date: 11/12/2017 Ryan Leonard, Ryan Leonard ([id number]) Goal Status: Active Interventions: Assess patient/caregiver ability to perform ulcer/skin care regimen upon admission and as needed Assess ulceration(s) every visit Notes: Electronic Signature(s) Signed: 08/03/2017 4:57:19 PM By: Elliot Gurney, BSN, RN, CWS, Kim RN, BSN Entered By: Elliot Gurney, BSN, RN, CWS, Kim  on 08/03/2017 09:47:50 Ryan Leonard ([id number]) -------------------------------------------------------------------------------- Pain Assessment Details Patient Name: Ryan Leonard Date of Service: 08/03/2017 9:15 AM Medical Record Number: [id number] Patient Account Number: [id number] Date of Birth/Sex: 04/01/1976 (42 y.o. Male) Treating RN: Phillis Haggis Primary Care Melaney Tellefsen: Naoma Diener Other Clinician: Referring Ronnie Mallette: Naoma Diener Treating Jaylie Neaves/Extender: Maxwell Caul Weeks in Treatment: 3 Active Problems Location of Pain Severity and Description of Pain Patient Has Paino Yes Site Locations Pain Location: Generalized Pain Rate the pain. Current Pain Level: 7 Character of Pain Describe the Pain: Aching, Other: itching Pain Management and Medication Current Pain Management: Notes Pt states the pain is from the vascular stent that was placed. Topical or injectable lidocaine is offered to patient for acute pain when surgical debridement is performed. If needed, Patient is instructed to use over the counter pain medication for the following 24-48 hours after debridement. Wound care MDs do not prescribed pain medications. Patient has chronic pain or uncontrolled pain. Patient has been instructed to make an appointment with their Primary Care Physician for pain management. Electronic Signature(s) Signed: 08/03/2017 4:30:35 PM By: Alejandro Mulling Entered By: Alejandro Mulling on 08/03/2017 09:25:02 Ryan Leonard ([id number]) -------------------------------------------------------------------------------- Patient/Caregiver Education Details Patient Name: Ryan Leonard Date of Service: 08/03/2017 9:15 AM Medical Record Number: [id number] Patient Account Number: [id number] Date of Birth/Gender: [birth year] (42 y.o. Male) Treating RN: Curtis Sites Primary Care Physician: Naoma Diener Other Clinician: Referring Physician: Naoma Diener Treating  Physician/Extender: Altamese Fairmount in Treatment: 3 Education Assessment Education Provided To: Patient Education Topics Provided Wound/Skin Impairment: Handouts: Other: wound care as ordered Methods: Demonstration, Explain/Verbal Responses: State content correctly Electronic Signature(s) Signed: 08/03/2017 4:38:39 PM By: Curtis Sites Entered By: Curtis Sites on 08/03/2017 10:11:05 Ryan Leonard ([id number]) -------------------------------------------------------------------------------- Wound Assessment Details Patient Name: Ryan Leonard Date of Service: 08/03/2017 9:15 AM Medical Record Number: [id number] Patient Account Number: [id number] Date of Birth/Sex: 06-28-1975 (42 y.o. Male) Treating RN: Ashok Cordia, Debi Primary Care Wakeelah Solan: Naoma Diener Other Clinician: Referring Sreenidhi Ganson: Naoma Diener Treating Kypton Eltringham/Extender: Maxwell Caul Weeks in Treatment: 3 Wound Status Wound Number: 1 Primary Diabetic Wound/Ulcer of the Lower Extremity Etiology: Wound Location:  Right Foot - Dorsal, Distal Wound Status: Open Wounding Event: Blister Comorbid Asthma, Hypertension, Type II Diabetes, Date Acquired: 07/09/2017 History: Neuropathy Weeks Of Treatment: 3 Clustered Wound: No Pending Amputation On Presentation Photos Photo Uploaded By: Alejandro MullingPinkerton, Debra on 08/03/2017 10:36:45 Wound Measurements Length: (cm) 2.5 Width: (cm) 2.8 Depth: (cm) 0.1 Area: (cm) 5.498 Volume: (cm) 0.55 % Reduction in Area: 61.1% % Reduction in Volume: 61.1% Epithelialization: None Tunneling: No Undermining: No Wound Description Classification: Grade 1 Wound Margin: Distinct, outline attached Exudate Amount: Small Exudate Type: Serous Exudate Color: amber Foul Odor After Cleansing: No Slough/Fibrino Yes Wound Bed Granulation Amount: Small (1-33%) Exposed Structure Granulation Quality: Red Fascia Exposed: No Necrotic Amount: Large (67-100%) Fat Layer  (Subcutaneous Tissue) Exposed: No Necrotic Quality: Eschar, Adherent Slough Tendon Exposed: No Muscle Exposed: No Joint Exposed: No Bone Exposed: No Periwound Skin Texture Ryan Leonard, Ryan Leonard ([id number]) Texture Color No Abnormalities Noted: No No Abnormalities Noted: No Erythema: Yes Moisture Erythema Location: Circumferential No Abnormalities Noted: No Maceration: No Temperature / Pain Temperature: Hot Tenderness on Palpation: Yes Wound Preparation Ulcer Cleansing: Rinsed/Irrigated with Saline Topical Anesthetic Applied: Other: lidocaine 4%, Treatment Notes Wound #1 (Right, Distal, Dorsal Foot) 1. Cleansed with: Clean wound with Normal Saline 2. Anesthetic Topical Lidocaine 4% cream to wound bed prior to debridement 4. Dressing Applied: Other dressing (specify in notes) 5. Secondary Dressing Applied ABD Pad Kerlix/Conform 7. Secured with Tape Notes silvercel, stretch netting, coverlet on fingers Electronic Signature(s) Signed: 08/03/2017 4:30:35 PM By: Alejandro MullingPinkerton, Debra Entered By: Alejandro MullingPinkerton, Debra on 08/03/2017 09:40:20 Ryan ChurchWILLIAMS, Ryan Leonard ([id number]) -------------------------------------------------------------------------------- Wound Assessment Details Patient Name: Ryan ChurchWILLIAMS, Ryan Leonard Date of Service: 08/03/2017 9:15 AM Medical Record Number: [id number] Patient Account Number: [id number] Date of Birth/Sex: 1975/12/31 51(41 y.o. Male) Treating RN: Ashok CordiaPinkerton, Debi Primary Care Tida Saner: Naoma DienerKOMIVES, EUGENIE Other Clinician: Referring Talani Brazee: Naoma DienerKOMIVES, EUGENIE Treating Carisa Backhaus/Extender: Maxwell CaulOBSON, MICHAEL G Weeks in Treatment: 3 Wound Status Wound Number: 2 Primary Diabetic Wound/Ulcer of the Lower Etiology: Extremity Wound Location: Right Foot - Dorsal, Proximal Wound Status: Open Wounding Event: Gradually Appeared Comorbid Asthma, Hypertension, Type II Diabetes, Date Acquired: 06/29/2017 History: Neuropathy Weeks Of Treatment: 3 Clustered Wound: No Pending Amputation On  Presentation Photos Photo Uploaded By: Alejandro MullingPinkerton, Debra on 08/03/2017 10:36:45 Wound Measurements Length: (cm) 0.9 Width: (cm) 0.5 Depth: (cm) 0.1 Area: (cm) 0.353 Volume: (cm) 0.035 % Reduction in Area: 50.1% % Reduction in Volume: 50.7% Epithelialization: None Tunneling: No Undermining: No Wound Description Classification: Grade 2 Wound Margin: Distinct, outline attached Exudate Amount: Large Exudate Type: Serous Exudate Color: amber Foul Odor After Cleansing: No Slough/Fibrino Yes Wound Bed Granulation Amount: Large (67-100%) Exposed Structure Granulation Quality: Pink Fascia Exposed: No Necrotic Amount: Small (1-33%) Fat Layer (Subcutaneous Tissue) Exposed: No Necrotic Quality: Adherent Slough Tendon Exposed: No Muscle Exposed: No Joint Exposed: No Bone Exposed: No Periwound Skin Texture Rigsby, Boleslaw ([id number]) Texture Color No Abnormalities Noted: No No Abnormalities Noted: No Erythema: Yes Moisture Erythema Location: Circumferential No Abnormalities Noted: No Temperature / Pain Temperature: Hot Tenderness on Palpation: Yes Wound Preparation Ulcer Cleansing: Rinsed/Irrigated with Saline Topical Anesthetic Applied: Other: lidocaine 4%, Treatment Notes Wound #2 (Right, Proximal, Dorsal Foot) 1. Cleansed with: Clean wound with Normal Saline 2. Anesthetic Topical Lidocaine 4% cream to wound bed prior to debridement 4. Dressing Applied: Other dressing (specify in notes) 5. Secondary Dressing Applied ABD Pad Kerlix/Conform 7. Secured with Tape Notes silvercel, stretch netting, coverlet on fingers Electronic Signature(s) Signed: 08/03/2017 4:30:35 PM By: Alejandro MullingPinkerton, Debra Entered By: Alejandro MullingPinkerton, Debra on 08/03/2017 09:39:15 Ryan ChurchWILLIAMS, Trypp ([id number]) -------------------------------------------------------------------------------- Wound  Assessment Details Patient Name: Ryan Leonard, Ryan Leonard Date of Service: 08/03/2017 9:15 AM Medical Record Number:  [id number] Patient Account Number: [id number] Date of Birth/Sex: 1975-12-19 (42 y.o. Male) Treating RN: Phillis Haggis Primary Care Cleburn Maiolo: Naoma Diener Other Clinician: Referring Demontae Antunes: Naoma Diener Treating [name] Mccadden/Extender: Maxwell Caul Weeks in Treatment: 3 Wound Status Wound Number: 3 Primary Diabetic Wound/Ulcer of the Lower Etiology: Extremity Wound Location: Right Lower Leg Wound Status: Open Wounding Event: Gradually Appeared Comorbid Asthma, Hypertension, Type II Diabetes, Date Acquired: 06/29/2017 History: Neuropathy Weeks Of Treatment: 3 Clustered Wound: No Pending Amputation On Presentation Photos Photo Uploaded By: Alejandro Mulling on 08/03/2017 10:37:24 Wound Measurements Length: (cm) 1.5 Width: (cm) 2 Depth: (cm) 0.1 Area: (cm) 2.356 Volume: (cm) 0.236 % Reduction in Area: 19.8% % Reduction in Volume: 19.7% Epithelialization: None Tunneling: No Undermining: No Wound Description Classification: Grade 2 Wound Margin: Distinct, outline attached Exudate Amount: Large Exudate Type: Serous Exudate Color: amber Foul Odor After Cleansing: No Slough/Fibrino Yes Wound Bed Granulation Amount: Large (67-100%) Exposed Structure Granulation Quality: Red Fascia Exposed: No Necrotic Amount: Small (1-33%) Fat Layer (Subcutaneous Tissue) Exposed: No Necrotic Quality: Adherent Slough Tendon Exposed: No Muscle Exposed: No Joint Exposed: No Bone Exposed: No Periwound Skin Texture Ryan Leonard, Ryan Leonard ([id number]) Texture Color No Abnormalities Noted: No No Abnormalities Noted: No Moisture Temperature / Pain No Abnormalities Noted: No Temperature: No Abnormality Tenderness on Palpation: Yes Wound Preparation Ulcer Cleansing: Rinsed/Irrigated with Saline Topical Anesthetic Applied: Other: lidocaine 4%, Treatment Notes Wound #3 (Right Lower Leg) 1. Cleansed with: Clean wound with Normal Saline 2. Anesthetic Topical Lidocaine 4% cream to  wound bed prior to debridement 4. Dressing Applied: Other dressing (specify in notes) 5. Secondary Dressing Applied ABD Pad Kerlix/Conform 7. Secured with Tape Notes silvercel, stretch netting, coverlet on fingers Electronic Signature(s) Signed: 08/03/2017 4:30:35 PM By: Alejandro Mulling Entered By: Alejandro Mulling on 08/03/2017 09:39:46 Ryan Leonard ([id number]) -------------------------------------------------------------------------------- Wound Assessment Details Patient Name: Ryan Leonard Date of Service: 08/03/2017 9:15 AM Medical Record Number: [id number] Patient Account Number: [id number] Date of Birth/Sex: 1976/02/17 (42 y.o. Male) Treating RN: Ashok Cordia, Debi Primary Care Selestino Nila: Naoma Diener Other Clinician: Referring Shamar Kracke: Naoma Diener Treating Kenda Kloehn/Extender: Maxwell Caul Weeks in Treatment: 3 Wound Status Wound Number: 4 Primary Trauma, Other Etiology: Wound Location: Right Hand - 2nd Digit Wound Status: Open Wounding Event: Trauma Comorbid Asthma, Hypertension, Type II Diabetes, Date Acquired: 06/29/2017 History: Neuropathy Weeks Of Treatment: 3 Clustered Wound: No Pending Amputation On Presentation Photos Photo Uploaded By: Alejandro Mulling on 08/03/2017 10:37:24 Wound Measurements Length: (cm) 0.4 Width: (cm) 0.3 Depth: (cm) 0.1 Area: (cm) 0.094 Volume: (cm) 0.009 % Reduction in Area: 60.2% % Reduction in Volume: 62.5% Epithelialization: None Tunneling: No Undermining: No Wound Description Full Thickness Without Exposed Support Classification: Structures Wound Margin: Distinct, outline attached Exudate None Present Amount: Foul Odor After Cleansing: No Slough/Fibrino Yes Wound Bed Granulation Amount: None Present (0%) Exposed Structure Necrotic Amount: Large (67-100%) Fascia Exposed: No Necrotic Quality: Eschar Fat Layer (Subcutaneous Tissue) Exposed: No Tendon Exposed: No Muscle Exposed: No Joint Exposed:  No Bone Exposed: No Periwound Skin Texture Ryan Leonard, Ryan Leonard ([id number]) Texture Color No Abnormalities Noted: No No Abnormalities Noted: No Moisture Temperature / Pain No Abnormalities Noted: No Temperature: No Abnormality Tenderness on Palpation: Yes Wound Preparation Ulcer Cleansing: Rinsed/Irrigated with Saline Topical Anesthetic Applied: Other: lidocaine 4%, Treatment Notes Wound #4 (Right Hand - 2nd Digit) 1. Cleansed with: Clean wound with Normal Saline 2. Anesthetic Topical Lidocaine 4% cream to wound bed prior to debridement 4.  Dressing Applied: Other dressing (specify in notes) 5. Secondary Dressing Applied ABD Pad Kerlix/Conform 7. Secured with Tape Notes silvercel, stretch netting, coverlet on fingers Electronic Signature(s) Signed: 08/03/2017 4:30:35 PM By: Alejandro Mulling Entered By: Alejandro Mulling on 08/03/2017 09:38:41 Ryan Leonard ([id number]) -------------------------------------------------------------------------------- Wound Assessment Details Patient Name: Ryan Leonard Date of Service: 08/03/2017 9:15 AM Medical Record Number: [id number] Patient Account Number: [id number] Date of Birth/Sex: 02-Jan-1976 (42 y.o. Male) Treating RN: Ashok Cordia, Debi Primary Care Rhian Funari: Naoma Diener Other Clinician: Referring Brianne Maina: Naoma Diener Treating Versia Mignogna/Extender: Maxwell Caul Weeks in Treatment: 3 Wound Status Wound Number: 5 Primary Trauma, Other Etiology: Wound Location: Right Hand - 3rd Digit Wound Status: Open Wounding Event: Trauma Comorbid Asthma, Hypertension, Type II Diabetes, Date Acquired: 06/29/2017 History: Neuropathy Weeks Of Treatment: 3 Clustered Wound: No Pending Amputation On Presentation Photos Photo Uploaded By: Alejandro Mulling on 08/03/2017 10:37:54 Wound Measurements Length: (cm) 0.5 Width: (cm) 0.6 Depth: (cm) 0.1 Area: (cm) 0.236 Volume: (cm) 0.024 % Reduction in Area: 91.7% % Reduction in  Volume: 91.5% Epithelialization: None Wound Description Full Thickness Without Exposed Support Classification: Structures Wound Margin: Distinct, outline attached Exudate None Present Amount: Foul Odor After Cleansing: No Slough/Fibrino Yes Wound Bed Granulation Amount: None Present (0%) Exposed Structure Necrotic Amount: Large (67-100%) Fascia Exposed: No Necrotic Quality: Eschar Fat Layer (Subcutaneous Tissue) Exposed: No Tendon Exposed: No Muscle Exposed: No Joint Exposed: No Bone Exposed: No Periwound Skin Texture Ryan Leonard, Ryan Leonard ([id number]) Texture Color No Abnormalities Noted: No No Abnormalities Noted: No Moisture Temperature / Pain No Abnormalities Noted: No Temperature: No Abnormality Tenderness on Palpation: Yes Wound Preparation Ulcer Cleansing: Rinsed/Irrigated with Saline Topical Anesthetic Applied: Other: lidocaine 4%, Treatment Notes Wound #5 (Right Hand - 3rd Digit) 1. Cleansed with: Clean wound with Normal Saline 2. Anesthetic Topical Lidocaine 4% cream to wound bed prior to debridement 4. Dressing Applied: Other dressing (specify in notes) 5. Secondary Dressing Applied ABD Pad Kerlix/Conform 7. Secured with Tape Notes silvercel, stretch netting, coverlet on fingers Electronic Signature(s) Signed: 08/03/2017 4:30:35 PM By: Alejandro Mulling Entered By: Alejandro Mulling on 08/03/2017 09:38:17 Ryan Leonard ([id number]) -------------------------------------------------------------------------------- Vitals Details Patient Name: Ryan Leonard Date of Service: 08/03/2017 9:15 AM Medical Record Number: [id number] Patient Account Number: [id number] Date of Birth/Sex: 1976/04/12 (42 y.o. Male) Treating RN: Ashok Cordia, Debi Primary Care Jc Veron: Naoma Diener Other Clinician: Referring Mischelle Reeg: Naoma Diener Treating Gerritt Galentine/Extender: [place]e [place] in Treatment: 3 Vital Signs Time Taken: 09:25 Temperature (F): 97.9 Height  (in): 72 Pulse (bpm): 86 Weight (lbs): 226.5 Respiratory Rate (breaths/min): 18 Body Mass Index (BMI): 30.7 Blood Pressure (mmHg): 164/90 Reference Range: 80 - 120 mg / dl Notes Made Dr. Leanord Hawking aware of BP. Electronic Signature(s) Signed: 08/03/2017 4:30:35 PM By: Alejandro Mulling Entered By: Alejandro Mulling on 08/03/2017 95:18:84

## 2017-08-04 NOTE — Progress Notes (Signed)
Ryan Leonard, Benjamin ([id number]) Visit Report for 08/03/2017 Debridement Details Patient Name: Ryan Leonard, Ryan Leonard Date of Service: 08/03/2017 9:15 AM Medical Record Number: [id number] Patient Account Number: [id number] Date of Birth/Sex: Nov 23, 1975 [age] y.o. Male) Treating RN: Huel CoventryWoody, Kim Primary Care Provider: Naoma DienerKOMIVES, EUGENIE Other Clinician: Referring Provider: Naoma DienerKOMIVES, EUGENIE Treating Provider/Extender: Altamese CarolinaOBSON, Aniesa Boback G Weeks in Treatment: 3 Debridement Performed for Wound #1 Right,Distal,Dorsal Foot Assessment: Performed By: Physician Maxwell CaulOBSON, Sacheen Arrasmith G, MD Debridement: Open Wound/Selective Severity of Tissue Pre Limited to breakdown of skin Debridement: Debridement Description: Selective Pre-procedure Verification/Time Yes - 09:50 Out Taken: Start Time: 09:50 Pain Control: Lidocaine 4% Topical Solution Level: Non-Viable Tissue Total Area Debrided (L x W): 2.5 (cm) x 2.8 (cm) = 7 (cm) Tissue and other material Viable, Non-Viable, Fibrin/Slough, Skin debrided: Instrument: Blade, Forceps Bleeding: Minimum Hemostasis Achieved: Pressure End Time: 09:51 Procedural Pain: 0 Post Procedural Pain: 0 Response to Treatment: Procedure was tolerated well Post Debridement Measurements of Total Wound Length: (cm) 2.5 Width: (cm) 2.8 Depth: (cm) 0.2 Volume: (cm) 1.1 Character of Wound/Ulcer Post Debridement: Requires Further Debridement Severity of Tissue Post Debridement: Fat layer exposed Post Procedure Diagnosis Same as Pre-procedure Electronic Signature(s) Signed: 08/03/2017 4:57:19 PM By: Elliot GurneyWoody, BSN, RN, CWS, Kim RN, BSN Signed: 08/03/2017 5:07:14 PM By: Baltazar Najjarobson, Keyen Marban MD Entered By: Baltazar Najjarobson, Eleaner Dibartolo on 08/03/2017 10:20:23 Ryan Leonard, Stanford ([id number]) -------------------------------------------------------------------------------- HPI Details Patient Name: Ryan Leonard, Ryan Leonard Date of Service: 08/03/2017 9:15 AM Medical Record Number: [id number] Patient Account Number: [id number] Date  of Birth/Sex: Nov 23, 1975 [age] y.o. Male) Treating RN: Huel CoventryWoody, Kim Primary Care Provider: Naoma DienerKOMIVES, EUGENIE Other Clinician: Referring Provider: Naoma DienerKOMIVES, EUGENIE Treating Provider/Extender: Maxwell CaulOBSON, Dontrelle Mazon G Weeks in Treatment: 3 History of Present Illness HPI Description: 07/13/17; this is a 42 year old man who is a poorly controlled diabetic with a hemoglobin A1c of over 14. Recently transitioned from oral agents to the addition of long-acting basal insulin. For the last 2 weeks he has had problems with ulcerations on his right foot this includes 2 shallow ulcerations one just distal to the ankle and a smaller one distal to this and a new recent blister just proximal to his toes. Last week his entire foot and lower ankle became intensely swollen and painful he was seen in the emergency room on [facility] [place] [place]. Diagnosed with a diabetic foot infection and put on Augmentin. This has helped somewhat with the swelling but he is still having a fair amount of pain and yesterday found it difficult to walk. He has not been systemically unwell. ABIs in this clinic were 0.88 and on the right 0.7-1 the left. He will definitely need formal arterial studies 07/20/17; my end of the day note on this patient from last week totally neglected to mention wounds over his right second third and fourth PIPs in his right hand which she obtained while trying to punch away ice. We've been using silver alginate to these areas as well. The area on the fourth PIP is healed. Second is much better. Third still covered by a large amount of adherent nonviable tissue oThe 3 wound areas from last week including the distal blister all look a lot better. Erythema and edema in the foot is better. Culture I did was negative x-ray of the foot I did was negative of the right foot and ankle ARTERIAL STUDIES showed an ABI on the right of 0.73 on the left hip 0.92. However TBIs were normal at 0.75 on the  right and 0.73 on the left. Waveforms were triphasic bilaterally  at the posterior and anterior tibial. The wounds will have enough blood flow for healing but I thought this should come to the attention of vascular surgeon especially with the reduced ABI on the right vis-o-vis his uncontrolled diabetes. 07/27/17; the area on the second fourth PIP is closed. Third still open but much better. All the wounds on his right foot and ankle look considerably better. Using silver alginate all wound areas. He saw vascular surgery and has an angiogram booked for tomorrow with Dr. Wyn Quaker. He sees dermatology next week 08/03/17; ANGIOGRAPHY; the patient had angiography on 07/28/17 performed by Dr. dew. He had percutaneous transluminal angioplasty of the right anterior tibial artery. Percutaneous transluminal angioplasty of the right superficial femoral artery with stent placement in the proximal superficial femoral artery. He is still having some pain in his right thigh on the medial aspect. He has not noted any problems in the catheter site in the left femoral. He has 3 wounds 2 on the or so foot, dorsal ankle and on the anterior tibial area. All of these are measuring smaller Electronic Signature(s) Signed: 08/03/2017 5:07:14 PM By: Baltazar Najjar MD Entered By: Baltazar Najjar on 08/03/2017 10:30:43 Ryan Leonard ([id number]) -------------------------------------------------------------------------------- Physical Exam Details Patient Name: Ryan Leonard Date of Service: 08/03/2017 9:15 AM Medical Record Number: [id number] Patient Account Number: [id number] Date of Birth/Sex: December 17, 1975 (42 y.o. Male) Treating RN: Huel Coventry Primary Care Provider: Naoma Diener Other Clinician: Referring Provider: Naoma Diener Treating Provider/Extender: Maxwell Caul Weeks in Treatment: 3 Constitutional Patient is hypertensive.. Pulse regular and within target range for patient.Marland Kitchen Respirations regular,  non-labored and within target range.. Temperature is normal and within the target range for the patient.Marland Kitchen appears in no distress. Eyes Conjunctivae clear. No discharge. Respiratory Respiratory effort is easy and symmetric bilaterally. Rate is normal at rest and on room air.. Cardiovascular Pedal pulses palpable and strong bilaterally.. There is nothing obvious looking at his left medial thigh.. Integumentary (Hair, Skin) There was nothing ominous looking in the skin in the right thigh or right calf. Psychiatric No evidence of depression, anxiety, or agitation. Calm, cooperative, and communicative. Appropriate interactions and affect.. Notes Wound exam oHe continues to make progress with the wounds on the dorsal foot dorsal ankle and tibia area. The area on the dorsal foot had thick adherent eschar which I removed with pickups and a #15 blade. This looks like it's close to healing. The other areas did not require debridement as long as they're getting smaller Electronic Signature(s) Signed: 08/03/2017 5:07:14 PM By: Baltazar Najjar MD Entered By: Baltazar Najjar on 08/03/2017 10:34:17 Ryan Leonard ([id number]) -------------------------------------------------------------------------------- Physician Orders Details Patient Name: Ryan Leonard Date of Service: 08/03/2017 9:15 AM Medical Record Number: [id number] Patient Account Number: [id number] Date of Birth/Sex: 1975/10/06 (42 y.o. Male) Treating RN: Huel Coventry Primary Care Provider: Naoma Diener Other Clinician: Referring Provider: Naoma Diener Treating Provider/Extender: Altamese Billings in Treatment: 3 Verbal / Phone Orders: No Diagnosis Coding Wound Cleansing Wound #1 Right,Distal,Dorsal Foot o Clean wound with Normal Saline. o Cleanse wound with mild soap and water o May Shower, gently pat wound dry prior to applying new dressing. Wound #2 Right,Proximal,Dorsal Foot o Clean wound with Normal  Saline. o Cleanse wound with mild soap and water o May Shower, gently pat wound dry prior to applying new dressing. Wound #3 Right Lower Leg o Clean wound with Normal Saline. o Cleanse wound with mild soap and water o May Shower, gently pat wound dry prior to applying new dressing. Wound #  4 Right Hand - 2nd Digit o Clean wound with Normal Saline. o Cleanse wound with mild soap and water o May Shower, gently pat wound dry prior to applying new dressing. Wound #5 Right Hand - 3rd Digit o Clean wound with Normal Saline. o Cleanse wound with mild soap and water o May Shower, gently pat wound dry prior to applying new dressing. Anesthetic (add to Medication List) Wound #1 Right,Distal,Dorsal Foot o Topical Lidocaine 4% cream applied to wound bed prior to debridement (In Clinic Only). Wound #2 Right,Proximal,Dorsal Foot o Topical Lidocaine 4% cream applied to wound bed prior to debridement (In Clinic Only). Wound #3 Right Lower Leg o Topical Lidocaine 4% cream applied to wound bed prior to debridement (In Clinic Only). Wound #4 Right Hand - 2nd Digit o Topical Lidocaine 4% cream applied to wound bed prior to debridement (In Clinic Only). Wound #5 Right Hand - 3rd Digit o Topical Lidocaine 4% cream applied to wound bed prior to debridement (In Clinic Only). Primary Wound Dressing Wound #1 Right,Distal,Dorsal Foot ISABEL, FREESE ([id number]) o Silvercel Non-Adherent Wound #2 Right,Proximal,Dorsal Foot o [place] Non-Adherent Wound #3 Right Lower Leg o [place] Non-Adherent Wound #4 Right Hand - 2nd Digit o [place] Non-Adherent Wound #5 Right Hand - 3rd Digit o [place] Non-Adherent Secondary Dressing Wound #1 Right,Distal,Dorsal Foot o ABD pad o Conform/Kerlix o Other - tape, stretch netting #5 Wound #2 Right,Proximal,Dorsal Foot o ABD pad o Conform/Kerlix o Other - tape, stretch netting #5 Wound #3 Right Lower Leg o  ABD pad o Conform/Kerlix o Other - tape, stretch netting #5 Wound #4 Right Hand - 2nd Digit o ABD pad o Conform/Kerlix o Other - tape, stretch netting #5 Wound #5 Right Hand - 3rd Digit o ABD pad o Conform/Kerlix o Other - tape, stretch netting #5 Dressing Change Frequency Wound #1 Right,Distal,Dorsal Foot o Change dressing every other day. o Other: - as needed Wound #2 Right,Proximal,Dorsal Foot o Change dressing every other day. o Other: - as needed Wound #3 Right Lower Leg o Change dressing every other day. o Other: - as needed Wound #4 Right Hand - 2nd Digit o Change dressing every other day. o Other: - as needed AHMET, SCHANK ([id number]) Wound #5 Right Hand - 3rd Digit o Change dressing every other day. o Other: - as needed Follow-up Appointments Wound #1 Right,Distal,Dorsal Foot o Return Appointment in 1 week. Wound #2 Right,Proximal,Dorsal Foot o Return Appointment in 1 week. Wound #3 Right Lower Leg o Return Appointment in 1 week. Wound #4 Right Hand - 2nd Digit o Return Appointment in 1 week. Wound #5 Right Hand - 3rd Digit o Return Appointment in 1 week. Edema Control Wound #1 Right,Distal,Dorsal Foot o Elevate legs to the level of the heart and pump ankles as often as possible Wound #2 Right,Proximal,Dorsal Foot o Elevate legs to the level of the heart and pump ankles as often as possible Wound #3 Right Lower Leg o Elevate legs to the level of the heart and pump ankles as often as possible Wound #4 Right Hand - 2nd Digit o Elevate legs to the level of the heart and pump ankles as often as possible Wound #5 Right Hand - 3rd Digit o Elevate legs to the level of the heart and pump ankles as often as possible Additional Orders / Instructions Wound #1 Right,Distal,Dorsal Foot o Increase protein intake. o OK to return to work o Other: - Vitamin C, Zinc Wound #2 Right,Proximal,Dorsal Foot o  Increase protein intake. o  OK to return to work o Other: - Vitamin C, Zinc Wound #3 Right Lower Leg o Increase protein intake. o OK to return to work o Other: - Vitamin C, Zinc Wound #4 Right Hand - 2nd Digit o Increase protein intake. o OK to return to work [place], Arlys John ([id number]) o Other: - Vitamin C, Zinc Wound #5 Right Hand - 3rd Digit o Increase protein intake. o OK to return to work o Other: - Vitamin C, Zinc Psychologist, prison and probation services) Signed: 08/03/2017 4:57:19 PM By: Elliot Gurney, BSN, RN, CWS, Kim RN, BSN Signed: 08/03/2017 5:07:14 PM By: Baltazar Najjar MD Entered By: Elliot Gurney, BSN, RN, CWS, Kim on 08/03/2017 09:57:38 Ryan Leonard ([id number]) -------------------------------------------------------------------------------- Problem List Details Patient Name: Ryan Leonard Date of Service: 08/03/2017 9:15 AM Medical Record Number: [id number] Patient Account Number: [id number] Date of Birth/Sex: Apr 20, 1976 (42 y.o. 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 E11.51 Type 2 diabetes mellitus with diabetic peripheral angiopathy without 07/13/2017 Yes gangrene E11.42 Type 2 diabetes mellitus with diabetic polyneuropathy 07/13/2017 Yes S61.411D Laceration without foreign body of right hand, subsequent 07/20/2017 Yes encounter Inactive Problems Resolved Problems Electronic Signature(s) Signed: 08/03/2017 5:07:14 PM By: Baltazar Najjar MD Entered By: Baltazar Najjar on 08/03/2017 10:17:41 Ryan Leonard  ([id number]) -------------------------------------------------------------------------------- Progress Note Details Patient Name: Ryan Leonard Date of Service: 08/03/2017 9:15 AM Medical Record Number: [id number] Patient Account Number: [id number] Date of Birth/Sex: Oct 25, 1975 (42 y.o. Male) Treating RN: Huel Coventry Primary Care Provider: Naoma Diener Other Clinician: Referring Provider: Naoma Diener Treating Provider/Extender: Maxwell Caul Weeks in Treatment: 3 Subjective History of Present Illness (HPI) 07/13/17; this is a 42 year old man who is a poorly controlled diabetic with a hemoglobin A1c of over 14. Recently transitioned from oral agents to the addition of long-acting basal insulin. For the last 2 weeks he has had problems with ulcerations on his right foot this includes 2 shallow ulcerations one just distal to the ankle and a smaller one distal to this and a new recent blister just proximal to his toes. Last week his entire foot and lower ankle became intensely swollen and painful he was seen in the emergency room on 07/10/17 at the [facility] clinic in [facility]. Diagnosed with a diabetic foot infection and put on Augmentin. This has helped somewhat with the swelling but he is still having a fair amount of pain and yesterday found it difficult to walk. He has not been systemically unwell. ABIs in this clinic were 0.88 and on the right 0.7-1 the left. He will definitely need formal arterial studies 07/20/17; my end of the day note on this patient from last week totally neglected to mention wounds over his right second third and fourth PIPs in his right hand which she obtained while trying to punch away ice. We've been using silver alginate to these areas as well. The area on the fourth PIP is healed. Second is much better. Third still covered by a large amount of adherent nonviable tissue The 3 wound areas from last week including the distal blister all look a lot  better. Erythema and edema in the foot is better. Culture I did was negative x-ray of  the foot I did was negative of the right foot and ankle ARTERIAL STUDIES showed an ABI on the right of 0.73 on the left hip 0.92. However TBIs were normal at 0.75 on the right and 0.73 on the left. Waveforms were triphasic bilaterally at the posterior and anterior tibial. The wounds will have enough blood flow for healing but I thought this should come to the attention of vascular surgeon especially with the reduced ABI on the right vis--vis his uncontrolled diabetes. 07/27/17; the area on the second fourth PIP is closed. Third still open but much better. All the wounds on his right foot and ankle look considerably better. Using silver alginate all wound areas. He saw vascular surgery and has an angiogram booked for tomorrow with Dr. Wyn Quaker. He sees dermatology next week 08/03/17; ANGIOGRAPHY; the patient had angiography on 07/28/17 performed by Dr. dew. He had percutaneous transluminal angioplasty of the right anterior tibial artery. Percutaneous transluminal angioplasty of the right superficial femoral artery with stent placement in the proximal superficial femoral artery. He is still having some pain in his right thigh on the medial aspect. He has not noted any problems in the catheter site in the left femoral. He has 3 wounds 2 on the or so foot, dorsal ankle and on the anterior tibial area. All of these are measuring smaller Objective Constitutional Patient is hypertensive.. Pulse regular and within target range for patient.Marland Kitchen Respirations regular, non-labored and within target range.. Temperature is normal and within the target range for the patient.Marland Kitchen appears in no distress. Vitals Time Taken: 9:25 AM, Height: 72 in, Weight: 226.5 lbs, BMI: 30.7, Temperature: 97.9 F, Pulse: 86 bpm, Respiratory Rate: 18 breaths/min, Blood Pressure: 164/90 mmHg. BARD, HAUPERT ([id number]) General Notes: Made Dr. Leanord Hawking aware  of BP. Eyes Conjunctivae clear. No discharge. Respiratory Respiratory effort is easy and symmetric bilaterally. Rate is normal at rest and on room air.. Cardiovascular Pedal pulses palpable and strong bilaterally.. There is nothing obvious looking at his left medial thigh.. Psychiatric No evidence of depression, anxiety, or agitation. Calm, cooperative, and communicative. Appropriate interactions and affect.. General Notes: Wound exam He continues to make progress with the wounds on the dorsal foot dorsal ankle and tibia area. The area on the dorsal foot had thick adherent eschar which I removed with pickups and a #15 blade. This looks like it's close to healing. The other areas did not require debridement as long as they're getting smaller Integumentary (Hair, Skin) There was nothing ominous looking in the skin in the right thigh or right calf. Wound #1 status is Open. Original cause of wound was Blister. The wound is located on the Right,Distal,Dorsal Foot. The wound measures 2.5cm length x 2.8cm width x 0.1cm depth; 5.498cm^2 area and 0.55cm^3 volume. There is no tunneling or undermining noted. There is a small amount of serous drainage noted. The wound margin is distinct with the outline attached to the wound base. There is small (1-33%) red granulation within the wound bed. There is a large (67-100%) amount of necrotic tissue within the wound bed including Eschar and Adherent Slough. The periwound skin appearance exhibited: Erythema. The periwound skin appearance did not exhibit: Maceration. The surrounding wound skin color is noted with erythema which is circumferential. Periwound temperature was noted as Hot. The periwound has tenderness on palpation. Wound #2 status is Open. Original cause of wound was Gradually Appeared. The wound is located on the Right,Proximal,Dorsal Foot. The wound measures 0.9cm length x 0.5cm width x 0.1cm depth; 0.353cm^2 area  and 0.035cm^3 volume. There is no  tunneling or undermining noted. There is a large amount of serous drainage noted. The wound margin is distinct with the outline attached to the wound base. There is large (67-100%) pink granulation within the wound bed. There is a small (1-33%) amount of necrotic tissue within the wound bed including Adherent Slough. The periwound skin appearance exhibited: Erythema. The surrounding wound skin color is noted with erythema which is circumferential. Periwound temperature was noted as Hot. The periwound has tenderness on palpation. Wound #3 status is Open. Original cause of wound was Gradually Appeared. The wound is located on the Right Lower Leg. The wound measures 1.5cm length x 2cm width x 0.1cm depth; 2.356cm^2 area and 0.236cm^3 volume. There is no tunneling or undermining noted. There is a large amount of serous drainage noted. The wound margin is distinct with the outline attached to the wound base. There is large (67-100%) red granulation within the wound bed. There is a small (1-33%) amount of necrotic tissue within the wound bed including Adherent Slough. Periwound temperature was noted as No Abnormality. The periwound has tenderness on palpation. Wound #4 status is Open. Original cause of wound was Trauma. The wound is located on the Right Hand - 2nd Digit. The wound measures 0.4cm length x 0.3cm width x 0.1cm depth; 0.094cm^2 area and 0.009cm^3 volume. There is no tunneling or undermining noted. There is a none present amount of drainage noted. The wound margin is distinct with the outline attached to the wound base. There is no granulation within the wound bed. There is a large (67-100%) amount of necrotic tissue within the wound bed including Eschar. Periwound temperature was noted as No Abnormality. The periwound has tenderness on palpation. Wound #5 status is Open. Original cause of wound was Trauma. The wound is located on the Right Hand - 3rd Digit. The wound measures 0.5cm length x  0.6cm width x 0.1cm depth; 0.236cm^2 area and 0.024cm^3 volume. There is a none present amount of drainage noted. The wound margin is distinct with the outline attached to the wound base. There is no granulation within the wound bed. There is a large (67-100%) amount of necrotic tissue within the wound bed including Eschar. Periwound temperature was noted as No Abnormality. The periwound has tenderness on palpation. 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 There was a Non-Viable Tissue Open Wound/Selective [phone]) debridement with total area of 7 sq cm performed by Maxwell Caul, MD. with the following instrument(s): Blade and Forceps to remove Viable and Non-Viable tissue/material including Fibrin/Slough and Skin after achieving pain control using Lidocaine 4% Topical Solution. A time out was conducted at 09:50, prior to the start of the procedure. A Minimum amount of bleeding was controlled with Pressure. The procedure was tolerated well with a pain level of 0 throughout and a pain level of 0 following the procedure. Post Debridement Measurements: 2.5cm length x 2.8cm width x 0.2cm depth;  1.1cm^3 volume. Character of Wound/Ulcer Post Debridement requires further debridement. Severity of Tissue Post Debridement is: Fat layer exposed. Post  procedure Diagnosis Wound #1: Same as Pre-Procedure Plan Wound Cleansing: Wound #1 Right,Distal,Dorsal Foot: Clean wound with Normal Saline. Cleanse wound with mild soap and water May Shower, gently pat wound dry prior to applying new dressing. Wound #2 Right,Proximal,Dorsal Foot: Clean wound with Normal Saline. Cleanse wound with mild soap and water May Shower, gently pat wound dry prior to applying new dressing. Wound #3 Right Lower Leg: Clean wound with Normal Saline. Cleanse wound with mild soap and water May Shower, gently pat wound dry prior to applying new dressing. Wound #4 Right Hand - 2nd Digit: Clean wound with Normal Saline. Cleanse wound with mild soap and water May Shower, gently pat wound dry prior to applying new dressing. CORNELIOUS, BARTOLUCCI ([id number]) Wound #5 Right Hand - 3rd Digit: Clean wound with Normal Saline. Cleanse wound with mild soap and water May Shower, gently pat wound dry prior to applying new dressing. Anesthetic (add to Medication List): Wound #1 Right,Distal,Dorsal Foot: Topical Lidocaine 4% cream applied to wound bed prior to debridement (In Clinic Only). Wound #2 Right,Proximal,Dorsal Foot: Topical Lidocaine 4% cream applied to wound bed prior to debridement (In Clinic Only). Wound #3 Right Lower Leg: Topical Lidocaine 4% cream applied to wound bed prior to debridement (In Clinic Only). Wound #4 Right Hand - 2nd Digit: Topical Lidocaine 4% cream applied to wound bed prior to debridement (In Clinic Only). Wound #5 Right Hand - 3rd Digit: Topical Lidocaine 4% cream applied to wound bed prior to debridement (In Clinic Only). Primary Wound Dressing: Wound #1 Right,Distal,Dorsal Foot: [place] Non-Adherent Wound #2 Right,Proximal,Dorsal Foot: [place] Non-Adherent Wound #3 Right Lower Leg: [place] Non-Adherent Wound #4 Right Hand - 2nd Digit: [place] Non-Adherent Wound #5 Right Hand - 3rd Digit: [place] Non-Adherent Secondary  Dressing: Wound #1 Right,Distal,Dorsal Foot: ABD pad Conform/Kerlix Other - tape, stretch netting #5 Wound #2 Right,Proximal,Dorsal Foot: ABD pad Conform/Kerlix Other - tape, stretch netting #5 Wound #3 Right Lower Leg: ABD pad Conform/Kerlix Other - tape, stretch netting #5 Wound #4 Right Hand - 2nd Digit: ABD pad Conform/Kerlix Other - tape, stretch netting #5 Wound #5 Right Hand - 3rd Digit: ABD pad Conform/Kerlix Other - tape, stretch netting #5 Dressing Change Frequency: Wound #1 Right,Distal,Dorsal Foot: Change dressing every other day. Other: - as needed Wound #2 Right,Proximal,Dorsal Foot: Change dressing every other day. Other: - as needed Wound #3 Right Lower Leg: Change dressing every other day. Other: - as needed Wound #4 Right Hand - 2nd Digit: Change dressing every other day. Other: - as needed PHILLIPPE, ORLICK ([id number]) Wound #5 Right Hand - 3rd Digit: Change dressing every other day. Other: - as needed Follow-up Appointments: Wound #1 Right,Distal,Dorsal Foot: Return Appointment in 1 week. Wound #2 Right,Proximal,Dorsal Foot: Return Appointment in 1 week. Wound #3 Right Lower Leg: Return Appointment in 1 week. Wound #4 Right Hand - 2nd Digit: Return Appointment in 1 week. Wound #5 Right Hand - 3rd Digit: Return Appointment in 1 week. Edema Control: Wound #1 Right,Distal,Dorsal Foot: Elevate legs to the level of the heart and pump ankles as often as possible Wound #2 Right,Proximal,Dorsal Foot: Elevate legs to the level of the heart and pump ankles as often as possible Wound #3 Right Lower Leg: Elevate legs to the level of the heart and pump ankles as often as possible Wound #4 Right Hand - 2nd Digit: Elevate legs to the level of the heart  and pump ankles as often as possible Wound #5 Right Hand - 3rd Digit: Elevate legs to the level of the heart and pump ankles as often as possible Additional Orders / Instructions: Wound #1  Right,Distal,Dorsal Foot: Increase protein intake. OK to return to work Other: - Vitamin C, Zinc Wound #2 Right,Proximal,Dorsal Foot: Increase protein intake. OK to return to work Other: - Vitamin C, Zinc Wound #3 Right Lower Leg: Increase protein intake. OK to return to work Other: - Vitamin C, Zinc Wound #4 Right Hand - 2nd Digit: Increase protein intake. OK to return to work Other: - Vitamin C, Zinc Wound #5 Right Hand - 3rd Digit: Increase protein intake. OK to return to work Other: - Vitamin C, Zinc #1 the patient's wounds are making progress. We will continue his over alginate ABDs Kerlix and con form. 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 He has a stent in the proximal superficial femoral artery #5 as long as these wounds are smaller I think we can get away with topical dressings without debridement. Electronic Signature(s) Signed: 08/03/2017 5:07:14 PM By: Baltazar Najjar MD Entered By: Baltazar Najjar on 08/03/2017 10:37:33 Ryan Leonard ([id number]) -------------------------------------------------------------------------------- SuperBill Details Patient Name: Ryan Leonard Date of Service: 08/03/2017 Medical Record Number: [id number] Patient Account Number: [id number] Date of Birth/Sex: Apr 04, 1976 (42 y.o. Male) Treating RN: Huel Coventry Primary Care Provider: Naoma Diener Other Clinician: Referring Provider: Naoma Diener Treating Provider/Extender: Maxwell Caul Weeks in Treatment: 3 Diagnosis Coding ICD-10 Codes Code Description E11.621 Type 2 diabetes mellitus with foot ulcer L03.115 Cellulitis of right lower limb L97.511 Non-pressure chronic ulcer of other part of right foot limited to breakdown of skin E11.51 Type 2 diabetes mellitus with diabetic peripheral angiopathy without  gangrene E11.42 Type 2 diabetes mellitus with diabetic polyneuropathy S61.411D Laceration without foreign body of right hand, subsequent encounter Facility Procedures CPT4 Code Description: [id number] - DEBRIDE WOUND 1ST 20 SQ CM OR < ICD-10 Diagnosis Description L97.511 Non-pressure chronic ulcer of other part of right foot limited t Modifier: o breakdown of Quantity: 1 skin Physician Procedures CPT4 Code Description: [id number] - WC PHYS DEBR WO ANESTH 20 SQ CM ICD-10 Diagnosis Description 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

## 2017-08-10 ENCOUNTER — Encounter: Payer: Managed Care, Other (non HMO) | Attending: Internal Medicine | Admitting: Internal Medicine

## 2017-08-10 DIAGNOSIS — L97511 Non-pressure chronic ulcer of other part of right foot limited to breakdown of skin: Secondary | ICD-10-CM | POA: Insufficient documentation

## 2017-08-10 DIAGNOSIS — J45909 Unspecified asthma, uncomplicated: Secondary | ICD-10-CM | POA: Diagnosis not present

## 2017-08-10 DIAGNOSIS — E11621 Type 2 diabetes mellitus with foot ulcer: Secondary | ICD-10-CM | POA: Insufficient documentation

## 2017-08-10 DIAGNOSIS — E1142 Type 2 diabetes mellitus with diabetic polyneuropathy: Secondary | ICD-10-CM | POA: Diagnosis not present

## 2017-08-10 DIAGNOSIS — E1151 Type 2 diabetes mellitus with diabetic peripheral angiopathy without gangrene: Secondary | ICD-10-CM | POA: Insufficient documentation

## 2017-08-10 DIAGNOSIS — L03115 Cellulitis of right lower limb: Secondary | ICD-10-CM | POA: Insufficient documentation

## 2017-08-10 DIAGNOSIS — I10 Essential (primary) hypertension: Secondary | ICD-10-CM | POA: Diagnosis not present

## 2017-08-11 NOTE — Progress Notes (Signed)
Ryan ChurchWILLIAMS, Aarion ([id number]) Visit Report for 08/10/2017 Arrival Information Details Patient Name: Ryan ChurchWILLIAMS, Jaleel Date of Service: 08/10/2017 8:30 AM Medical Record Number: [id number] Patient Account Number: [id number] Date of Birth/Sex: 10-22-1975 [age] y.o. Male) Treating RN: Renne CriglerFlinchum, Cheryl Primary Care Blimie Vaness: Naoma DienerKOMIVES, EUGENIE Other Clinician: Referring Jennise Both: Naoma DienerKOMIVES, EUGENIE Treating Saniyyah Elster/Extender: Altamese CarolinaOBSON, MICHAEL G Weeks in Treatment: 4 Visit Information History Since Last Visit All ordered tests and consults were completed: No Patient Arrived: Ambulatory Added or deleted any medications: No Arrival Time: 08:41 Any new allergies or adverse reactions: No Accompanied By: son Had a fall or experienced change in No Transfer Assistance: None activities of daily living that may affect Patient Identification Verified: Yes risk of falls: Secondary Verification Process Completed: Yes Signs or symptoms of abuse/neglect since last visito No Patient Requires Transmission-Based No Hospitalized since last visit: No Precautions: Pain Present Now: No Patient Has Alerts: Yes Patient Alerts: DM II Electronic Signature(s) Signed: 08/10/2017 4:15:18 PM By: Renne CriglerFlinchum, Cheryl Entered By: Renne CriglerFlinchum, Cheryl on 08/10/2017 08:41:31 Ryan ChurchWILLIAMS, Alister ([id number]) -------------------------------------------------------------------------------- Clinic Level of Care Assessment Details Patient Name: Ryan ChurchWILLIAMS, Eberardo Date of Service: 08/10/2017 8:30 AM Medical Record Number: [id number] Patient Account Number: [id number] Date of Birth/Sex: 10-22-1975 [age] y.o. Male) Treating RN: Huel CoventryWoody, Kim Primary Care Toye Rouillard: Naoma DienerKOMIVES, EUGENIE Other Clinician: Referring Seher Schlagel: Naoma DienerKOMIVES, EUGENIE Treating Gwenette Wellons/Extender: Altamese CarolinaOBSON, MICHAEL G Weeks in Treatment: 4 Clinic Level of Care Assessment Items TOOL 4 Quantity Score []  - Use when only an EandM is performed on FOLLOW-UP visit 0 ASSESSMENTS - Nursing Assessment /  Reassessment X - Reassessment of Co-morbidities (includes updates in patient status) 1 10 X- 1 5 Reassessment of Adherence to Treatment Plan ASSESSMENTS - Wound and Skin Assessment / Reassessment X - Simple Wound Assessment / Reassessment - one wound 1 5 []  - 0 Complex Wound Assessment / Reassessment - multiple wounds []  - 0 Dermatologic / Skin Assessment (not related to wound area) ASSESSMENTS - Focused Assessment []  - Circumferential Edema Measurements - multi extremities 0 []  - 0 Nutritional Assessment / Counseling / Intervention []  - 0 Lower Extremity Assessment (monofilament, tuning fork, pulses) []  - 0 Peripheral Arterial Disease Assessment (using hand held doppler) ASSESSMENTS - Ostomy and/or Continence Assessment and Care []  - Incontinence Assessment and Management 0 []  - 0 Ostomy Care Assessment and Management (repouching, etc.) PROCESS - Coordination of Care X - Simple Patient / Family Education for ongoing care 1 15 []  - 0 Complex (extensive) Patient / Family Education for ongoing care []  - 0 Staff obtains ChiropractorConsents, Records, Test Results / Process Orders []  - 0 Staff telephones HHA, Nursing Homes / Clarify orders / etc []  - 0 Routine Transfer to another Facility (non-emergent condition) []  - 0 Routine Hospital Admission (non-emergent condition) []  - 0 New Admissions / Manufacturing engineernsurance Authorizations / Ordering NPWT, Apligraf, etc. []  - 0 Emergency Hospital Admission (emergent condition) X- 1 10 Simple Discharge Coordination Ryan ChurchWILLIAMS, Qusai ([id number]) []  - 0 Complex (extensive) Discharge Coordination PROCESS - Special Needs []  - Pediatric / Minor Patient Management 0 []  - 0 Isolation Patient Management []  - 0 Hearing / Language / Visual special needs []  - 0 Assessment of Community assistance (transportation, D/C planning, etc.) []  - 0 Additional assistance / Altered mentation []  - 0 Support Surface(s) Assessment (bed, cushion, seat, etc.) INTERVENTIONS -  Wound Cleansing / Measurement X - Simple Wound Cleansing - one wound 1 5 []  - 0 Complex Wound Cleansing - multiple wounds X- 1 5 Wound Imaging (photographs - any number of wounds) []  - 0 Wound  Tracing (instead of photographs) X- 1 5 Simple Wound Measurement - one wound []  - 0 Complex Wound Measurement - multiple wounds INTERVENTIONS - Wound Dressings []  - Small Wound Dressing one or multiple wounds 0 X- 1 15 Medium Wound Dressing one or multiple wounds []  - 0 Large Wound Dressing one or multiple wounds []  - 0 Application of Medications - topical []  - 0 Application of Medications - injection INTERVENTIONS - Miscellaneous []  - External ear exam 0 []  - 0 Specimen Collection (cultures, biopsies, blood, body fluids, etc.) []  - 0 Specimen(s) / Culture(s) sent or taken to Lab for analysis []  - 0 Patient Transfer (multiple staff / Nurse, adult / Similar devices) []  - 0 Simple Staple / Suture removal (25 or less) []  - 0 Complex Staple / Suture removal (26 or more) []  - 0 Hypo / Hyperglycemic Management (close monitor of Blood Glucose) []  - 0 Ankle / Brachial Index (ABI) - do not check if billed separately X- 1 5 Vital Signs MAHDI, FRYE ([id number]) Has the patient been seen at the hospital within the last three years: Yes Total Score: 80 Level Of Care: New/Established - Level 3 Electronic Signature(s) Signed: 08/10/2017 4:24:03 PM By: Elliot Gurney, BSN, RN, CWS, Kim RN, BSN Entered By: Elliot Gurney, BSN, RN, CWS, Kim on 08/10/2017 09:14:06 Ryan Leonard ([id number]) -------------------------------------------------------------------------------- Encounter Discharge Information Details Patient Name: Ryan Leonard Date of Service: 08/10/2017 8:30 AM Medical Record Number: [id number] Patient Account Number: [id number] Date of Birth/Sex: 1975-07-22 (42 y.o. Male) Treating RN: Huel Coventry Primary Care Howard Patton: Naoma Diener Other Clinician: Referring Cian Costanzo: Naoma Diener Treating  Hershy Flenner/Extender: Altamese Cool in Treatment: 4 Encounter Discharge Information Items Discharge Pain Level: 0 Discharge Condition: Stable Ambulatory Status: Ambulatory Discharge Destination: Home Transportation: Private Auto Accompanied By: son Schedule Follow-up Appointment: Yes Medication Reconciliation completed and No provided to Patient/Care Hosam Mcfetridge: Provided on Clinical Summary of Care: 08/10/2017 Form Type Recipient Paper Patient BW Electronic Signature(s) Signed: 08/10/2017 9:33:56 AM By: Curtis Sites Entered By: Curtis Sites on 08/10/2017 09:33:56 Ryan Leonard ([id number]) -------------------------------------------------------------------------------- Lower Extremity Assessment Details Patient Name: Ryan Leonard Date of Service: 08/10/2017 8:30 AM Medical Record Number: [id number] Patient Account Number: [id number] Date of Birth/Sex: [birth year] (42 y.o. Male) Treating RN: Renne Crigler Primary Care Harneet Noblett: Naoma Diener Other Clinician: Referring Michaelyn Wall: Naoma Diener Treating Zoraya Fiorenza/Extender: Maxwell Caul Weeks in Treatment: 4 Vascular Assessment Pulses: Dorsalis Pedis Palpable: [Right:Yes] Posterior Tibial Extremity colors, hair growth, and conditions: Extremity Color: [Right:Hyperpigmented] Temperature of Extremity: [Right:Warm] Capillary Refill: [Right:< 3 seconds] Toe Nail Assessment Left: Right: Thick: No Discolored: No Deformed: No Improper Length and Hygiene: No Electronic Signature(s) Signed: 08/10/2017 4:15:18 PM By: Renne Crigler Entered By: Renne Crigler on 08/10/2017 08:53:08 Ryan Leonard ([id number]) -------------------------------------------------------------------------------- Multi Wound Chart Details Patient Name: Ryan Leonard Date of Service: 08/10/2017 8:30 AM Medical Record Number: [id number] Patient Account Number: [id number] Date of Birth/Sex: 08-Feb-1976 (42 y.o. 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] Comorbid History: [1:N/A] [2:N/A] [3:Asthma, Hypertension, Type II Diabetes, Neuropathy] Date Acquired: [  1:07/09/2017] [2:06/29/2017] [3:06/29/2017] Weeks of Treatment: [1:4] [2:4] [3:4] Wound Status: [1:Healed - Epithelialized] [2:Healed - Epithelialized] [3:Open] Pending Amputation on [1:Yes] [2:Yes] [3:Yes] Presentation: Measurements L x W x D [1:0x0x0] [2:0x0x0] [3:1.5x2x0.1] (cm) Area (cm) : [1:0] [2:0] [3:2.356] Volume (cm) : [1:0] [2:0] [3:0.236] % Reduction in Area: [1:100.00%] [2:100.00%] [3:19.80%] % Reduction in Volume: [1:100.00%] [2:100.00%] [3:19.70%] Classification: [1:Grade 1] [2:Grade 2] [3:Grade 2] Exudate Amount: [1:N/A] [2:N/A] [3:Large] Exudate Type: [1:N/A] [2:N/A] [3:Serous] Exudate Color: [1:N/A] [2:N/A] [3:amber] Wound Margin: [1:N/A] [2:N/A] [3:Distinct, outline attached] Granulation Amount: [1:N/A] [2:N/A] [3:Medium (34-66%)] Granulation Quality: [1:N/A] [2:N/A] [3:Red] Necrotic Amount: [1:N/A] [2:N/A] [3:Medium (34-66%)] Epithelialization: [1:N/A] [2:N/A] [3:None] Periwound Skin Texture: [1:No Abnormalities Noted] [2:No Abnormalities Noted] [3:Excoriation: No Induration: No Callus: No Crepitus: No Rash: No Scarring: No] Periwound Skin  Moisture: [1:No Abnormalities Noted] [2:No Abnormalities Noted] [3:Maceration: No Dry/Scaly: No] Periwound Skin Color: [1:No Abnormalities Noted] [2:No Abnormalities Noted] [3:Atrophie Blanche: No Cyanosis: No] Ecchymosis: No Erythema: No Hemosiderin Staining: No Mottled: No Pallor: No Rubor: No Temperature: N/A N/A No Abnormality Tenderness on Palpation: No No Yes Wound Preparation: N/A N/A Ulcer Cleansing: Rinsed/Irrigated with Saline Topical Anesthetic Applied: Other: lidocaine 4% Wound Number: 4 5 N/A Photos: No Photos No Photos N/A Wound Location: Right Hand - 2nd Digit Right Hand - 3rd Digit N/A Wounding Event: Trauma Trauma N/A Primary Etiology: Trauma, Other Trauma, Other N/A Comorbid History: Asthma, Hypertension, Type II N/A N/A Diabetes, Neuropathy Date Acquired: 06/29/2017 06/29/2017 N/A Weeks of Treatment: 4 4 N/A Wound Status: Healed - Epithelialized Healed - Epithelialized N/A Pending Amputation on Yes Yes N/A Presentation: Measurements L x W x D 0x0x0 0x0x0 N/A (cm) Area (cm) : 0 0 N/A Volume (cm) : 0 0 N/A % Reduction in Area: 100.00% 100.00% N/A % Reduction in Volume: 100.00% 100.00% N/A Classification: Full Thickness Without Full Thickness Without N/A Exposed Support Structures Exposed Support Structures Exudate Amount: None Present N/A N/A Exudate Type: N/A N/A N/A Exudate Color: N/A N/A N/A Wound Margin: Distinct, outline attached N/A N/A Granulation Amount: None Present (0%) N/A N/A Granulation Quality: N/A N/A N/A Necrotic Amount: None Present (0%) N/A N/A Exposed Structures: Fascia: No N/A N/A Fat Layer (Subcutaneous Tissue) Exposed: No Tendon: No Muscle: No Joint: No Bone: No Epithelialization: None N/A N/A Periwound Skin Texture: No Abnormalities Noted No Abnormalities Noted N/A Periwound Skin Moisture: No Abnormalities Noted No Abnormalities Noted N/A Periwound Skin Color: No Abnormalities Noted No Abnormalities Noted  N/A Temperature: No Abnormality N/A N/A Tenderness on Palpation: Yes No N/A Wound Preparation: Ulcer Cleansing: N/A N/A Rinsed/Irrigated with Saline CANDICE, LUNNEY ([id number]) Topical Anesthetic Applied: None Treatment Notes Electronic Signature(s) Signed: 08/10/2017 5:10:26 PM By: Baltazar Najjar MD Entered By: Baltazar Najjar on 08/10/2017 09:27:27 Ryan Leonard ([id number]) -------------------------------------------------------------------------------- Multi-Disciplinary Care Plan Details Patient Name: Ryan Leonard Date of Service: 08/10/2017 8:30 AM Medical Record Number: [id number] Patient Account Number: [id number] Date of Birth/Sex: 20-Dec-1975 (42 y.o. Male) Treating RN: Huel Coventry Primary Care Pio Eatherly: Naoma Diener Other Clinician: Referring Maryn Freelove: Naoma Diener Treating Hazel Leveille/Extender: Maxwell Caul Weeks in Treatment: 4 Active Inactive ` Nutrition Nursing Diagnoses: Imbalanced nutrition Impaired glucose control: actual or potential Potential for alteratiion in Nutrition/Potential for imbalanced nutrition Goals: Patient/caregiver agrees to and verbalizes understanding of need to use nutritional supplements and/or vitamins as prescribed Date Initiated: 07/13/2017 Target Resolution Date: 11/12/2017 Goal Status: Active Patient/caregiver will maintain therapeutic glucose control Date Initiated: 07/13/2017 Target Resolution Date: 11/12/2017 Goal Status: Active Interventions: Assess patient nutrition upon admission and as needed per policy Provide education  on elevated blood sugars and impact on wound healing Provide education on nutrition Treatment Activities: Education provided on Nutrition : 07/13/2017 Notes: ` Orientation to the Wound Care Program Nursing Diagnoses: Knowledge deficit related to the wound healing center program Goals: Patient/caregiver will verbalize understanding of the Wound Healing Center Program Date Initiated: 07/13/2017 Target  Resolution Date: 08/13/2017 Goal Status: Active Interventions: Provide education on orientation to the wound center Notes: MITHCELL, SCHUMPERT ([id number]) Pain, Acute or Chronic Nursing Diagnoses: Pain, acute or chronic: actual or potential Potential alteration in comfort, pain Goals: Patient/caregiver will verbalize adequate pain control between visits Date Initiated: 07/13/2017 Target Resolution Date: 11/12/2017 Goal Status: Active Interventions: Complete pain assessment as per visit requirements Encourage patient to take pain medications as prescribed Notes: ` Soft Tissue Infection Nursing Diagnoses: Impaired tissue integrity Knowledge deficit related to disease process and management Knowledge deficit related to home infection control: handwashing, handling of soiled dressings, supply storage Potential for infection: soft tissue Goals: Patient will remain free of wound infection Date Initiated: 07/13/2017 Target Resolution Date: 11/12/2017 Goal Status: Active Patient/caregiver will verbalize understanding of or measures to prevent infection and contamination in the home setting Date Initiated: 07/13/2017 Target Resolution Date: 11/12/2017 Goal Status: Active Interventions: Assess signs and symptoms of infection every visit Provide education on infection Treatment Activities: Education provided on Infection : 07/13/2017 Notes: ` Wound/Skin Impairment Nursing Diagnoses: Impaired tissue integrity Knowledge deficit related to ulceration/compromised skin integrity Goals: Ulcer/skin breakdown will have a volume reduction of 80% by week 12 Date Initiated: 07/13/2017 Target Resolution Date: 11/12/2017 HALLIE, ISHIDA ([id number]) Goal Status: Active Interventions: Assess patient/caregiver ability to perform ulcer/skin care regimen upon admission and as needed Assess ulceration(s) every visit Notes: Electronic Signature(s) Signed: 08/10/2017 4:24:03 PM By: Elliot Gurney, BSN, RN, CWS, Kim RN,  BSN Entered By: Elliot Gurney, BSN, RN, CWS, Kim on 08/10/2017 09:12:49 Ryan Leonard ([id number]) -------------------------------------------------------------------------------- Pain Assessment Details Patient Name: Ryan Leonard Date of Service: 08/10/2017 8:30 AM Medical Record Number: [id number] Patient Account Number: [id number] Date of Birth/Sex: 1975/11/04 (42 y.o. Male) Treating RN: Renne Crigler Primary Care Arran Fessel: Naoma Diener Other Clinician: Referring Sacora Hawbaker: Naoma Diener Treating Markese Bloxham/Extender: Maxwell Caul Weeks in Treatment: 4 Active Problems Location of Pain Severity and Description of Pain Patient Has Paino No Site Locations Pain Management and Medication Current Pain Management: Electronic Signature(s) Signed: 08/10/2017 4:15:18 PM By: Renne Crigler Entered By: Renne Crigler on 08/10/2017 08:41:38 Ryan Leonard ([id number]) -------------------------------------------------------------------------------- Patient/Caregiver Education Details Patient Name: Ryan Leonard Date of Service: 08/10/2017 8:30 AM Medical Record Number: [id number] Patient Account Number: [id number] Date of Birth/Gender: 03-23-1976 (42 y.o. Male) Treating RN: Curtis Sites Primary Care Physician: Naoma Diener Other Clinician: Referring Physician: Naoma Diener Treating Physician/Extender: Altamese Hoffman in Treatment: 4 Education Assessment Education Provided To: Patient Education Topics Provided Wound/Skin Impairment: Handouts: Other: wound care as ordered and care of newly healed sites Methods: Demonstration, Explain/Verbal Responses: State content correctly Electronic Signature(s) Signed: 08/10/2017 4:38:42 PM By: Curtis Sites Entered By: Curtis Sites on 08/10/2017 09:34:20 Ryan Leonard ([id number]) -------------------------------------------------------------------------------- Wound Assessment Details Patient Name: Ryan Leonard Date of Service: 08/10/2017 8:30 AM Medical Record Number: [id number] Patient Account Number: [id number] Date of Birth/Sex: Aug 08, 1975 (42 y.o. Male) Treating RN: Renne Crigler Primary Care Monet North: Naoma Diener Other Clinician: Referring Quinne Pires: Naoma Diener Treating Scottlynn Lindell/Extender: Maxwell Caul Weeks in Treatment: 4 Wound Status Wound Number: 1 Primary Diabetic Wound/Ulcer of the Lower Etiology: Extremity Wound Location: Right, Distal, Dorsal Foot Wound Status: Healed - Epithelialized Wounding Event: Blister Date Acquired:  07/09/2017 Weeks Of Treatment: 4 Clustered Wound: No Pending Amputation On Presentation Photos Photo Uploaded By: Elliot Gurney, BSN, RN, CWS, Kim on 08/10/2017 16:50:10 Wound Measurements Length: (cm) 0 % Width: (cm) 0 % Depth: (cm) 0 Area: (cm) 0 Volume: (cm) 0 Reduction in Area: 100% Reduction in Volume: 100% Wound Description Classification: Grade 1 Periwound Skin Texture Texture Color No Abnormalities Noted: No No Abnormalities Noted: No Moisture No Abnormalities Noted: No Electronic Signature(s) Signed: 08/10/2017 4:15:18 PM By: Renne Crigler Entered By: Renne Crigler on 08/10/2017 08:50:35 Ryan Leonard ([id number]) -------------------------------------------------------------------------------- Wound Assessment Details Patient Name: Ryan Leonard Date of Service: 08/10/2017 8:30 AM Medical Record Number: [id number] Patient Account Number: [id number] Date of Birth/Sex: 1976/03/01 (42 y.o. Male) Treating RN: Renne Crigler Primary Care Tehran Rabenold: Naoma Diener Other Clinician: Referring Raja Liska: Naoma Diener Treating Ernesta Trabert/Extender: Maxwell Caul Weeks in Treatment: 4 Wound Status Wound Number: 2 Primary Diabetic Wound/Ulcer of the Lower Etiology: Extremity Wound Location: Right, Proximal, Dorsal Foot Wound Status: Healed - Epithelialized Wounding Event: Gradually Appeared Date Acquired:  06/29/2017 Weeks Of Treatment: 4 Clustered Wound: No Pending Amputation On Presentation Photos Photo Uploaded By: Elliot Gurney, BSN, RN, CWS, Kim on 08/10/2017 16:50:10 Wound Measurements Length: (cm) 0 % Width: (cm) 0 % Depth: (cm) 0 Area: (cm) 0 Volume: (cm) 0 Reduction in Area: 100% Reduction in Volume: 100% Wound Description Classification: Grade 2 Periwound Skin Texture Texture Color No Abnormalities Noted: No No Abnormalities Noted: No Moisture No Abnormalities Noted: No Electronic Signature(s) Signed: 08/10/2017 4:15:18 PM By: Renne Crigler Entered By: Renne Crigler on 08/10/2017 08:52:26 Ryan Leonard ([id number]) -------------------------------------------------------------------------------- Wound Assessment Details Patient Name: Ryan Leonard Date of Service: 08/10/2017 8:30 AM Medical Record Number: [id number] Patient Account Number: [id number] Date of Birth/Sex: 08-Jul-1975 (42 y.o. Male) Treating RN: Renne Crigler Primary Care Jamal Haskin: Naoma Diener Other Clinician: Referring Faithlyn Recktenwald: Naoma Diener Treating Luv Mish/Extender: Maxwell Caul Weeks in Treatment: 4 Wound Status Wound Number: 3 Primary Diabetic Wound/Ulcer of the Lower Etiology: Extremity Wound Location: Right Lower Leg Wound Status: Open Wounding Event: Gradually Appeared Comorbid Asthma, Hypertension, Type II Diabetes, Date Acquired: 06/29/2017 History: Neuropathy Weeks Of Treatment: 4 Clustered Wound: No Pending Amputation On Presentation Photos Photo Uploaded By: Elliot Gurney, BSN, RN, CWS, Kim on 08/10/2017 16:50:10 Wound Measurements Length: (cm) 1.5 Width: (cm) 2 Depth: (cm) 0.1 Area: (cm) 2.356 Volume: (cm) 0.236 % Reduction in Area: 19.8% % Reduction in Volume: 19.7% Epithelialization: None Tunneling: No Undermining: No Wound Description Classification: Grade 2 Foul Odor Wound Margin: Distinct, outline attached Slough/Fi Exudate Amount: Large Exudate Type:  Serous Exudate Color: amber After Cleansing: No brino Yes Wound Bed Granulation Amount: Medium (34-66%) Exposed Structure Granulation Quality: Red Fascia Exposed: No Necrotic Amount: Medium (34-66%) Fat Layer (Subcutaneous Tissue) Exposed: Yes Necrotic Quality: Adherent Slough Tendon Exposed: No Muscle Exposed: No Joint Exposed: No Bone Exposed: No Periwound Skin Texture Feagan, Braelynn ([id number]) Texture Color No Abnormalities Noted: No No Abnormalities Noted: No Callus: No Atrophie Blanche: No Crepitus: No Cyanosis: No Excoriation: No Ecchymosis: No Induration: No Erythema: No Rash: No Hemosiderin Staining: No Scarring: No Mottled: No Pallor: No Moisture Rubor: No No Abnormalities Noted: No Dry / Scaly: No Temperature / Pain Maceration: No Temperature: No Abnormality Tenderness on Palpation: Yes Wound Preparation Ulcer Cleansing: Rinsed/Irrigated with Saline Topical Anesthetic Applied: Other: lidocaine 4%, Treatment Notes Wound #3 (Right Lower Leg) 1. Cleansed with: Clean wound with Normal Saline 2. Anesthetic Topical Lidocaine 4% cream to wound bed prior to debridement 4. Dressing Applied: Other dressing (specify in notes) 5. Secondary  Dressing Applied ABD Pad Kerlix/Conform 7. Secured with Tape Notes silvercel, stretch netting Electronic Signature(s) Signed: 08/10/2017 4:15:18 PM By: Renne Crigler Entered By: Renne Crigler on 08/10/2017 08:49:31 Ryan Leonard ([id number]) -------------------------------------------------------------------------------- Wound Assessment Details Patient Name: Ryan Leonard Date of Service: 08/10/2017 8:30 AM Medical Record Number: [id number] Patient Account Number: [id number] Date of Birth/Sex: 11-07-1975 (42 y.o. Male) Treating RN: Renne Crigler Primary Care Raphael Fitzpatrick: Naoma Diener Other Clinician: Referring Sayge Salvato: Naoma Diener Treating Jahzeel Poythress/Extender: Maxwell Caul Weeks in Treatment:  4 Wound Status Wound Number: 4 Primary Trauma, Other Etiology: Wound Location: Right Hand - 2nd Digit Wound Status: Healed - Epithelialized Wounding Event: Trauma Comorbid Asthma, Hypertension, Type II Diabetes, Date Acquired: 06/29/2017 History: Neuropathy Weeks Of Treatment: 4 Clustered Wound: No Pending Amputation On Presentation Photos Photo Uploaded By: Elliot Gurney, BSN, RN, CWS, Kim on 08/10/2017 16:50:30 Wound Measurements Length: (cm) 0 % Reduct Width: (cm) 0 % Reduct Depth: (cm) 0 Epitheli Area: (cm) 0 Tunneli Volume: (cm) 0 Undermi ion in Area: 100% ion in Volume: 100% alization: None ng: No ning: No Wound Description Full Thickness Without Exposed Support Foul Odo Classification: Structures Slough/F Wound Margin: Distinct, outline attached Exudate None Present Amount: r After Cleansing: No ibrino No Wound Bed Granulation Amount: None Present (0%) Exposed Structure Necrotic Amount: None Present (0%) Fascia Exposed: No Fat Layer (Subcutaneous Tissue) Exposed: No Tendon Exposed: No Muscle Exposed: No Joint Exposed: No Bone Exposed: No Periwound Skin Texture STEPHEN, BARUCH ([id number]) Texture Color No Abnormalities Noted: No No Abnormalities Noted: No Moisture Temperature / Pain No Abnormalities Noted: No Temperature: No Abnormality Tenderness on Palpation: Yes Wound Preparation Ulcer Cleansing: Rinsed/Irrigated with Saline Topical Anesthetic Applied: None Electronic Signature(s) Signed: 08/10/2017 4:15:18 PM By: Renne Crigler Entered By: Renne Crigler on 08/10/2017 08:46:54 Ryan Leonard ([id number]) -------------------------------------------------------------------------------- Wound Assessment Details Patient Name: Ryan Leonard Date of Service: 08/10/2017 8:30 AM Medical Record Number: [id number] Patient Account Number: [id number] Date of Birth/Sex: 18-Jul-1975 (42 y.o. Male) Treating RN: Renne Crigler Primary Care Orlando Thalmann: Naoma Diener Other Clinician: Referring Cahlil Sattar: Naoma Diener Treating Tecia Cinnamon/Extender: Maxwell Caul Weeks in Treatment: 4 Wound Status Wound Number: 5 Primary Etiology: Trauma, Other Wound Location: Right Hand - 3rd Digit Wound Status: Healed - Epithelialized Wounding Event: Trauma Date Acquired: 06/29/2017 Weeks Of Treatment: 4 Clustered Wound: No Pending Amputation On Presentation Photos Photo Uploaded By: Elliot Gurney, BSN, RN, CWS, Kim on 08/10/2017 16:50:30 Wound Measurements Length: (cm) 0 Width: (cm) 0 Depth: (cm) 0 Area: (cm) 0 Volume: (cm) 0 % Reduction in Area: 100% % Reduction in Volume: 100% Wound Description Full Thickness Without Exposed Support Classification: Structures Periwound Skin Texture Texture Color No Abnormalities Noted: No No Abnormalities Noted: No Moisture No Abnormalities Noted: No Electronic Signature(s) Signed: 08/10/2017 4:15:18 PM By: Renne Crigler Entered By: Renne Crigler on 08/10/2017 08:48:13 Ryan Leonard ([id number]) -------------------------------------------------------------------------------- Vitals Details Patient Name: Ryan Leonard Date of Service: 08/10/2017 8:30 AM Medical Record Number: [id number] Patient Account Number: [id number] Date of Birth/Sex: February 18, 1976 (42 y.o. Male) Treating RN: Renne Crigler Primary Care Hazelene Doten: Naoma Diener Other Clinician: Referring Sequita Wise: Naoma Diener Treating Tonji Elliff/Extender: Maxwell Caul Weeks in Treatment: 4 Vital Signs Time Taken: 08:43 Temperature (F): 97.7 Height (in): 72 Pulse (bpm): 74 Weight (lbs): 226.5 Respiratory Rate (breaths/min): 18 Body Mass Index (BMI): 30.7 Blood Pressure (mmHg): 158/91 Reference Range: 80 - 120 mg / dl Electronic Signature(s) Signed: 08/10/2017 4:15:18 PM By: Renne Crigler Entered By: Renne Crigler on 08/10/2017 08:43:30

## 2017-08-11 NOTE — Progress Notes (Signed)
FAVOR, HACKLER ([id number]) Visit Report for 08/10/2017 HPI Details Patient Name: Ryan Leonard, Ryan Leonard Date of Service: 08/10/2017 8:30 AM Medical Record Number: [id number] Patient Account Number: [id number] Date of Birth/Sex: [birth year] (42 y.o. Male) Treating RN: Huel Coventry Primary Care Provider: Naoma Diener Other Clinician: Referring Provider: Naoma Diener Treating Provider/Extender: Maxwell Caul Weeks in Treatment: 4 History of Present Illness HPI Description: 07/13/17; this is a 42 year old man who is a poorly controlled diabetic with a hemoglobin A1c of over 14. Recently transitioned from oral agents to the addition of long-acting basal insulin. For the last 2 weeks he has had problems with ulcerations on his right foot this includes 2 shallow ulcerations one just distal to the ankle and a smaller one distal to this and a new recent blister just proximal to his toes. Last week his entire foot and lower ankle became intensely swollen and painful he was seen in the emergency room on 07/10/17 at the [facility] clinic in [facility]. Diagnosed with a diabetic foot infection and put on Augmentin. This has helped somewhat with the swelling but he is still having a fair amount of pain and yesterday found it difficult to walk. He has not been systemically unwell. ABIs in this clinic were 0.88 and on the right 0.7-1 the left. He will definitely need formal arterial studies 07/20/17; my end of the day note on this patient from last week totally neglected to mention wounds over his right second third and fourth PIPs in his right hand which she obtained while trying to punch away ice. We've been using silver alginate to these areas as well. The area on the fourth PIP is healed. Second is much better. Third still covered by a large amount of adherent nonviable tissue oThe 3 wound areas from last week including the distal blister all look a lot better. Erythema and edema in the foot is  better. Culture I did was negative x-ray of the foot I did was negative of the right foot and ankle ARTERIAL STUDIES showed an ABI on the right of 0.73 on the left hip 0.92. However TBIs were normal at 0.75 on the right and 0.73 on the left. Waveforms were triphasic bilaterally at the posterior and anterior tibial. The wounds will have enough blood flow for healing but I thought this should come to the attention of vascular surgeon especially with the reduced ABI on the right vis-o-vis his uncontrolled diabetes. 07/27/17; the area on the second fourth PIP is closed. Third still open but much better. All the wounds on his right foot and ankle look considerably better. Using silver alginate all wound areas. He saw vascular surgery and has an angiogram booked for tomorrow with Dr. Wyn Quaker. He sees dermatology next week 08/03/17; ANGIOGRAPHY; the patient had angiography on 07/28/17 performed by Dr. dew. He had percutaneous transluminal angioplasty of the right anterior tibial artery. Percutaneous transluminal angioplasty of the right superficial femoral artery with stent placement in the proximal superficial femoral artery. He is still having some pain in his right thigh on the medial aspect. He has not noted any problems in the catheter site in the left femoral. He has 3 wounds 2 on the or so foot, dorsal ankle and on the anterior tibial area. All of these are measuring smaller 08/10/17; patient arrives with the areas on his hands totally healed. The area on his dorsal foot and dorsal ankle are also healed. He still has one open area on the dorsal right calf just above the ankle  which is still open. We've been using silver alginate. 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 Electronic Signature(s) Signed: 08/10/2017 5:10:26 PM By: Baltazar Najjar MD Entered By: Baltazar Najjar on 08/10/2017 09:29:11 Ryan Leonard  ([id number]) -------------------------------------------------------------------------------- Physical Exam Details Patient Name: Ryan Leonard Date of Service: 08/10/2017 8:30 AM Medical Record Number: [id number] Patient Account Number: [id number] Date of Birth/Sex: 1975/11/07 (42 y.o. Male) Treating RN: Huel Coventry Primary Care Provider: Naoma Diener Other Clinician: Referring Provider: Naoma Diener Treating Provider/Extender: Maxwell Caul Weeks in Treatment: 4 Constitutional Patient is hypertensive.. Pulse regular and within target range for patient.Marland Kitchen Respirations regular, non-labored and within target range.. Temperature is normal and within the target range for the patient.Marland Kitchen appears in no distress. Respiratory Respiratory effort is easy and symmetric bilaterally. Rate is normal at rest and on room air.. Cardiovascular dorsalis pedis pulses palpable. Lymphatic none palpable in the popliteal area. Integumentary (Hair, Skin) dark superficial macules and papules in the bilateral tibial area also on the right greater than left arm. 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 Notes wound exam oHe continues to make progress with the wounds healed on the dorsal foot and ankle. Still open on the dorsal right shin area. However this is also smaller. The areas on his hands are also healed Electronic Signature(s) Signed: 08/10/2017 5:10:26 PM By: Baltazar Najjar MD Entered By: Baltazar Najjar on 08/10/2017 09:30:56 Ryan Leonard ([id number]) -------------------------------------------------------------------------------- Physician Orders Details Patient Name: Ryan Leonard Date of Service: 08/10/2017 8:30 AM Medical Record Number: [id number] Patient Account Number: [id number] Date of Birth/Sex: August 05, 1975 (42 y.o. Male) Treating RN: Huel Coventry Primary Care Provider: Naoma Diener Other Clinician: Referring Provider: Naoma Diener Treating Provider/Extender: [place] in Treatment: 4 Verbal / Phone Orders: No Diagnosis Coding Wound Cleansing Wound #3 Right Lower Leg o Clean wound with Normal Saline. o Cleanse wound with mild soap and water o May Shower, gently pat wound dry prior to applying new dressing. Anesthetic (add to Medication List) Wound #3 Right Lower Leg o Topical Lidocaine 4% cream applied to wound bed prior to debridement (In Clinic Only). Primary Wound Dressing Wound #3 Right Lower Leg o Silvercel Non-Adherent Secondary Dressing Wound #3 Right Lower Leg o ABD pad o Conform/Kerlix o Other - tape, stretch netting #5 Dressing Change Frequency Wound #3 Right Lower Leg o Change dressing every other day. o Other: - as needed Follow-up Appointments Wound #3 Right Lower Leg o Return Appointment in 1 week. Edema Control Wound #3 Right Lower Leg o Elevate legs to the level of the heart and pump ankles as often as possible Additional Orders / Instructions Wound #3 Right Lower Leg o Increase protein intake. o OK to return to work o Other: - Vitamin C, Zinc Electronic Signature(s) JORJE, VANATTA ([id number]) Signed: 08/10/2017 4:24:03 PM By: Elliot Gurney, BSN, RN, CWS, Kim RN, BSN Signed: 08/10/2017 5:10:26 PM By: Baltazar Najjar MD Entered By: Elliot Gurney, BSN, RN, CWS, Kim on 08/10/2017 09:13:39 Ryan Leonard ([id number]) -------------------------------------------------------------------------------- Problem List Details Patient Name: Ryan Leonard Date of Service: 08/10/2017 8:30 AM Medical Record Number: [id number] Patient Account Number: [id number] Date of Birth/Sex: [birth year] (42 y.o. 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 E11.51 Type 2 diabetes mellitus with diabetic peripheral angiopathy without 07/13/2017 Yes gangrene E11.42 Type 2 diabetes mellitus with diabetic polyneuropathy 07/13/2017 Yes S61.411D Laceration without foreign body of right hand, subsequent 07/20/2017 Yes encounter Inactive Problems Resolved Problems Electronic Signature(s) Signed: 08/10/2017 5:10:26 PM By: Baltazar Najjar MD Entered By: Baltazar Najjar on 08/10/2017 09:27:16 Ryan Leonard ([id number]) -------------------------------------------------------------------------------- Progress Note Details Patient Name: Ryan Leonard Date of Service: 08/10/2017 8:30 AM Medical Record Number: [id number] Patient Account Number: [id number] Date of Birth/Sex: 04-03-1976 (42 y.o. Male) Treating RN: Huel Coventry Primary Care Provider: Naoma Diener Other Clinician: Referring Provider: Naoma Diener Treating Provider/Extender: Maxwell Caul Weeks in Treatment: 4 Subjective History of Present Illness (HPI) 07/13/17; this is a 42 year old man who is a poorly controlled diabetic with a hemoglobin A1c of over 14. Recently transitioned from oral agents to the addition of long-acting basal insulin. For the last 2 weeks he has had problems with ulcerations on his right foot this includes 2 shallow ulcerations one just distal to the ankle and a smaller one distal to this and a new recent blister just proximal to his toes. Last week his entire foot and lower ankle became intensely swollen and painful he was seen in the emergency room on 07/10/17 at the [facility] clinic in [facility]. Diagnosed with a diabetic foot infection and put on Augmentin. This has helped somewhat with the swelling but he is still having a fair amount of pain and yesterday found it difficult to walk. He  has not been systemically unwell. ABIs in this clinic were 0.88 and on the right 0.7-1 the left. He will definitely need formal arterial studies 07/20/17; my end of the day note on this patient from last week totally neglected to mention wounds over his right second third and fourth PIPs in his right hand which she obtained while trying to punch away ice. We've been using silver alginate to these areas as well. The area on the fourth PIP is healed. Second is much better. Third still covered by a large amount of adherent nonviable tissue The 3 wound areas from last week including the distal blister all look a lot better. Erythema and edema in the foot is better. Culture I did was negative x-ray of the foot I did was negative of the right foot and ankle ARTERIAL STUDIES showed an ABI on the right of 0.73 on the left hip 0.92. However TBIs were normal at 0.75 on the right and 0.73 on the left. Waveforms were triphasic bilaterally at the posterior and anterior tibial. The wounds will have enough blood flow for healing but I thought this should come to the attention of vascular surgeon especially with the reduced ABI on the right vis--vis his uncontrolled diabetes. 07/27/17; the area on the second fourth PIP is closed. Third still open but much better. All the wounds on his right foot and ankle look considerably better. Using silver alginate all wound areas. He saw vascular surgery and has an angiogram booked for tomorrow with Dr. Wyn Quaker. He sees dermatology next week 08/03/17; ANGIOGRAPHY; the patient had angiography on 07/28/17 performed by Dr. dew. He had percutaneous transluminal angioplasty of the right anterior tibial artery. Percutaneous transluminal angioplasty of the right superficial femoral artery with stent placement in the proximal superficial femoral artery. He is still having some pain in his right thigh on the medial aspect. He has not noted any problems in the catheter site in the left  femoral. He has 3 wounds 2 on the or so foot, dorsal ankle and on the anterior tibial area. All of these are measuring smaller 08/10/17; patient arrives with the areas on his hands totally healed. The area on his dorsal foot and dorsal ankle are also healed. He still has one open area on the dorsal right calf just above the ankle which is still open. We've been using silver alginate. 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 Objective Constitutional Patient is hypertensive.. Pulse regular and within target range for patient.Marland Kitchen Respirations regular, non-labored and within target range.. Temperature is normal and within the target range for the patient.Marland Kitchen appears in no distress. [place], Ryan John ([id number]) Vitals Time Taken: 8:43 AM, Height: 72 in, Weight: 226.5 lbs, BMI: 30.7, Temperature: 97.7 F, Pulse: 74 bpm, Respiratory Rate: 18 breaths/min, Blood Pressure: 158/91 mmHg. Respiratory Respiratory effort is easy and symmetric bilaterally. Rate is normal at rest and on room air.. Cardiovascular dorsalis pedis pulses palpable. Lymphatic none palpable in the popliteal area. General Notes: wound exam He continues to make progress with the wounds healed on the dorsal foot and ankle. Still open on the dorsal right shin area. However this is also smaller. The areas on his hands are also healed Integumentary (Hair, Skin) dark superficial macules and papules in the bilateral tibial area also on the right greater than left arm. 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 Wound #1 status is Healed - Epithelialized. Original cause of wound was Blister. The wound is located on the Right,Distal,Dorsal Foot. The wound measures 0cm length x 0cm width x 0cm depth; 0cm^2 area and 0cm^3 volume. Wound #2 status is Healed - Epithelialized. Original cause of wound was Gradually Appeared. The wound is located on the Right,Proximal,Dorsal Foot. The wound measures 0cm length x 0cm  width x 0cm depth; 0cm^2 area and 0cm^3 volume. Wound #3 status is Open. Original cause of wound was Gradually Appeared. The wound is located on the Right Lower Leg. The wound measures 1.5cm length x 2cm width x 0.1cm depth; 2.356cm^2 area and 0.236cm^3 volume. There is Fat Layer (Subcutaneous Tissue) Exposed exposed. There is no tunneling or undermining noted. There is a large amount of serous drainage noted. The wound margin is distinct with the outline attached to the wound base. There is medium (34-66%) red granulation within the wound bed. There is a medium (34-66%) amount of necrotic tissue within the wound bed including Adherent Slough. The periwound skin appearance did not exhibit: Callus, Crepitus, Excoriation, Induration, Rash, Scarring, Dry/Scaly, Maceration, Atrophie Blanche, Cyanosis, Ecchymosis, Hemosiderin Staining, Mottled, Pallor, Rubor, Erythema. Periwound temperature was noted as No Abnormality. The periwound has tenderness on palpation. Wound #4 status is Healed - Epithelialized. Original cause of wound was Trauma. The wound is located on the Right Hand - 2nd Digit. The wound measures 0cm length x 0cm width x 0cm depth; 0cm^2 area and 0cm^3 volume. There is no tunneling or undermining noted. There is a none present amount of drainage noted. The wound margin is distinct with the outline attached to the wound base. There is no granulation within the wound bed. There is no necrotic tissue within the wound bed. Periwound temperature was noted as No Abnormality. The periwound has tenderness on palpation. Wound #5 status is Healed - Epithelialized. Original cause of wound was Trauma. The wound is located on the Right Hand - 3rd Digit. The wound measures 0cm length x 0cm width x 0cm depth; 0cm^2 area and 0cm^3 volume. 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 Cleanse wound with mild soap and water May Shower, gently pat wound dry prior to applying new dressing. Anesthetic (add to Medication List): Wound #3 Right Lower Leg: Topical Lidocaine 4% cream applied to wound bed prior to debridement (In Clinic Only). Primary Wound Dressing: Wound #3 Right Lower Leg: Silvercel Non-Adherent Secondary Dressing: Wound #3 Right Lower Leg: ABD pad Conform/Kerlix Other - tape, stretch netting #5 Dressing Change Frequency: Wound #3 Right Lower Leg: Change dressing every other day. Other: - as needed Follow-up Appointments: Wound #3 Right Lower Leg: Return Appointment in 1 week. Edema Control: Wound #3 Right Lower Leg: Elevate legs to the level of the heart and pump ankles as often as possible Additional Orders / Instructions: Wound #3 Right Lower Leg: Increase protein intake. OK to return to work Other: - Vitamin C, Zinc #1 we are making excellent progress #2 continue silver alginate to the remaining open wound in the right shin area #3 Kerlix and conform line #4 the patient should be healed next week Electronic Signature(s) Signed: 08/10/2017 5:10:26 PM By: Baltazar Najjarobson, Chanese Hartsough MD [place] GreenWILLIAMS, Ryan Leonard ([id number]) Entered By: Baltazar Najjarobson, Tonee Silverstein on 08/10/2017 09:32:16 Ryan Leonard, Ryan Leonard ([id number]) -------------------------------------------------------------------------------- SuperBill Details Patient Name: Ryan Leonard, Ryan Leonard Date of Service: 08/10/2017 Medical Record Number: [id number] Patient Account Number: [id number] Date of Birth/Sex: [birth year](41 y.o. Male) Treating  RN: Huel CoventryWoody, Kim Primary Care Provider: Naoma DienerKOMIVES, EUGENIE Other Clinician: Referring Provider: Naoma DienerKOMIVES, EUGENIE Treating Provider/Extender: Maxwell CaulOBSON, Kelilah Hebard G Weeks in Treatment: 4 Diagnosis Coding ICD-10 Codes Code Description E11.621 Type 2 diabetes mellitus with foot ulcer L03.115 Cellulitis of right lower limb L97.511 Non-pressure chronic ulcer of other part of right foot limited to breakdown of skin E11.51 Type 2 diabetes mellitus with diabetic peripheral angiopathy without gangrene E11.42 Type 2 diabetes mellitus with diabetic polyneuropathy S61.411D Laceration without foreign body of right hand, subsequent encounter Facility Procedures CPT4 Code: [id number] Description: 99213 - WOUND CARE VISIT-LEV 3 EST PT Modifier: Quantity: 1 Physician Procedures CPT4 Code Description: [id number] 99213 - WC PHYS LEVEL 3 - EST PT ICD-10 Diagnosis Description E11.621 Type 2 diabetes mellitus with foot ulcer L97.511 Non-pressure chronic ulcer of other part of right foot limited Modifier: to breakdown of Quantity: 1 skin Electronic Signature(s) Signed: 08/10/2017 5:10:26 PM By: Baltazar Najjarobson, Joscelin Fray MD Entered By: Baltazar Najjarobson, Genifer Lazenby on 08/10/2017 09:33:05

## 2017-08-17 ENCOUNTER — Encounter: Payer: Managed Care, Other (non HMO) | Admitting: Internal Medicine

## 2017-08-17 DIAGNOSIS — E11621 Type 2 diabetes mellitus with foot ulcer: Secondary | ICD-10-CM | POA: Diagnosis not present

## 2017-08-19 NOTE — Progress Notes (Signed)
[name], [name] ([id number]) Visit Report for 08/17/2017 HPI Details Patient Name: Ryan Leonard, Ryan Leonard Date of Service: 08/17/2017 9:30 AM Medical Record Number: [id number] Patient Account Number: [id number] Date of Birth/Sex: [birth year] (42 y.o. Male) Treating RN: Huel Coventry Primary Care Provider: Naoma Diener Other Clinician: Referring Provider: Naoma Diener Treating Provider/Extender: Maxwell Caul Weeks in Treatment: 5 History of Present Illness HPI Description: 07/13/17; this is a 41 year old man who is a poorly controlled diabetic with a hemoglobin A1c of over 14. Recently transitioned from oral agents to the addition of long-acting basal insulin. For the last 2 weeks he has had problems with ulcerations on his right foot this includes 2 shallow ulcerations one just distal to the ankle and a smaller one distal to this and a new recent blister just proximal to his toes. Last week his entire foot and lower ankle became intensely swollen and painful he was seen in the emergency room on 07/10/17 at the [facility] clinic in [facility]. Diagnosed with a diabetic foot infection and put on Augmentin. This has helped somewhat with the swelling but he is still having a fair amount of pain and yesterday found it difficult to walk. He has not been systemically unwell. ABIs in this clinic were 0.88 and on the right 0.7-1 the left. He will definitely need formal arterial studies 07/20/17; my end of the day note on this patient from last week totally neglected to mention wounds over his right second third and fourth PIPs in his right hand which she obtained while trying to punch away ice. We've been using silver alginate to these areas as well. The area on the fourth PIP is healed. Second is much better. Third still covered by a large amount of adherent nonviable tissue oThe 3 wound areas from last week including the distal blister all look a lot better. Erythema and edema in the foot is  better. Culture I did was negative x-ray of the foot I did was negative of the right foot and ankle ARTERIAL STUDIES showed an ABI on the right of 0.73 on the left hip 0.92. However TBIs were normal at 0.75 on the right and 0.73 on the left. Waveforms were triphasic bilaterally at the posterior and anterior tibial. The wounds will have enough blood flow for healing but I thought this should come to the attention of vascular surgeon especially with the reduced ABI on the right vis-o-vis his uncontrolled diabetes. 07/27/17; the area on the second fourth PIP is closed. Third still open but much better. All the wounds on his right foot and ankle look considerably better. Using silver alginate all wound areas. He saw vascular surgery and has an angiogram booked for tomorrow with Dr. Wyn Quaker. He sees dermatology next week 08/03/17; ANGIOGRAPHY; the patient had angiography on 07/28/17 performed by Dr. dew. He had percutaneous transluminal angioplasty of the right anterior tibial artery. Percutaneous transluminal angioplasty of the right superficial femoral artery with stent placement in the proximal superficial femoral artery. He is still having some pain in his right thigh on the medial aspect. He has not noted any problems in the catheter site in the left femoral. He has 3 wounds 2 on the or so foot, dorsal ankle and on the anterior tibial area. All of these are measuring smaller 08/10/17; patient arrives with the areas on his hands totally healed. The area on his dorsal foot and dorsal ankle are also healed. He still has one open area on the dorsal right calf just above the ankle  which is still open. We've been using silver alginate. 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 08/17/17; the patient is been to see dermatology with regards to the pigmented macules and papules on his bilateral anterior shins and arms. I don't see a specific diagnosis but they did do a biopsy. 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 The remaining wound just above the lateral right ankle. Dimensions are considerably smaller but is still not closed. We've been using silver alginate. Electronic Signature(s) Signed: 08/17/2017 5:42:24 PM By: Baltazar Najjarobson, Deon Ivey MD Entered By: Baltazar Najjarobson, Fabio Wah on 08/17/2017 10:19:20 Ryan Leonard, Ryan Leonard ([id number]) Ryan Leonard, Ryan Leonard ([id number]) -------------------------------------------------------------------------------- Physical Exam Details Patient Name: Ryan Leonard, Ryan Leonard Date of Service: 08/17/2017 9:30 AM Medical Record Number: [id number] Patient Account Number: [id number] Date of Birth/Sex: 03-23-1976 77(41 y.o. Male) Treating RN: Huel CoventryWoody, Kim Primary Care Provider: Naoma DienerKOMIVES, EUGENIE Other Clinician: Referring Provider: Naoma DienerKOMIVES, EUGENIE Treating Provider/Extender: Maxwell CaulOBSON, Zayana Salvador G Weeks in Treatment: 5 Constitutional Patient is hypertensive.. Pulse regular and within target range for patient.Marland Kitchen. Respirations regular, non-labored and within target range.. Temperature is normal and within the target range for the patient.Marland Kitchen. appears in no distress. Eyes Conjunctivae clear. No discharge. Respiratory Respiratory effort is easy and symmetric bilaterally. Rate is normal at rest and on room air.. Cardiovascular pedal pulses are palpable on the right. there is no edema. Lymphatic none palpable in the popliteal area. Integumentary (Hair, Skin) widespread pigmented macules and papules on the bilateral anterior tibias area as well as on his arms to a lesser extent. He has had a biopsy of an area on his left calf. Notes wound exam; he has one remaining open area on the right anterior tibia area just above the ankle. This has healthy granulation. Dimensions are down from last time. No evidence of surrounding infection Electronic Signature(s) Signed: 08/17/2017 5:42:24 PM By: Baltazar Najjarobson, Khaden Gater MD Entered By: Baltazar Najjarobson,  Wilena Tyndall on 08/17/2017 10:24:19 Ryan Leonard, Ryan Leonard ([id number]) -------------------------------------------------------------------------------- Physician Orders Details Patient Name: Ryan Leonard, Ryan Leonard Date of Service: 08/17/2017 9:30 AM Medical Record Number: [id number] Patient Account Number: [id number] Date of Birth/Sex: 03-23-1976 [age] y.o. Male) Treating RN: Huel CoventryWoody, Kim Primary Care Provider: Naoma DienerKOMIVES, EUGENIE Other Clinician: Referring Provider: Naoma DienerKOMIVES, EUGENIE Treating Provider/Extender: Altamese CarolinaOBSON, Mazzy Santarelli G Weeks in Treatment: 5 Verbal / Phone Orders: No Diagnosis Coding Wound Cleansing Wound #3 Right Lower Leg o Clean wound with Normal Saline. o Cleanse wound with mild soap and water o May Shower, gently pat wound dry prior to applying new dressing. Anesthetic (add to Medication List) Wound #3 Right Lower Leg o Topical Lidocaine 4% cream applied to wound bed prior to debridement (In Clinic Only). Primary Wound Dressing Wound #3 Right Lower Leg o Silvercel Non-Adherent Secondary Dressing Wound #3 Right Lower Leg o ABD pad o Conform/Kerlix o Other - tape, stretch netting #5 Dressing Change Frequency Wound #3 Right Lower Leg o Change dressing every other day. o Other: - as needed Follow-up Appointments Wound #3 Right Lower Leg o Return Appointment in 1 week. Edema Control Wound #3 Right Lower Leg o Elevate legs to the level of the heart and pump ankles as often as possible Additional Orders / Instructions Wound #3 Right Lower Leg o Increase protein intake. o OK to return to work o Other: - Vitamin C, Zinc Electronic Signature(s) Ryan Leonard, Revere ([id number]) Signed: 08/17/2017 5:38:59 PM By: Elliot GurneyWoody, BSN, RN, CWS, Kim RN, BSN Signed: 08/17/2017 5:42:24 PM By: Baltazar Najjarobson, Anthonio Mizzell MD Entered By: Elliot GurneyWoody, BSN, RN, CWS, Kim on 08/17/2017  10:00:47 Ryan Leonard, HARDENBROOK  ([id number]) -------------------------------------------------------------------------------- Problem List Details Patient Name: Ryan Leonard, Ryan Leonard Date of Service: 08/17/2017 9:30 AM Medical Record Number: [id number] Patient Account Number: [id number] Date of Birth/Sex: March 13, 1976 (42 y.o. 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 E11.51 Type 2 diabetes mellitus with diabetic peripheral angiopathy without 07/13/2017 Yes gangrene E11.42 Type 2 diabetes mellitus with diabetic polyneuropathy 07/13/2017 Yes S61.411D Laceration without foreign body of right hand, subsequent 07/20/2017 Yes encounter Inactive Problems Resolved Problems Electronic Signature(s) Signed: 08/17/2017 5:42:24 PM By: Baltazar Najjar MD Entered By: Baltazar Najjar on 08/17/2017 10:17:17 Ryan Leonard ([id number]) -------------------------------------------------------------------------------- Progress Note Details Patient Name: Ryan Leonard Date of Service: 08/17/2017 9:30 AM Medical Record Number: [id number] Patient Account Number: [id number] Date of Birth/Sex: Aug 17, 1975 (42 y.o. Male) Treating RN: Huel Coventry Primary Care Provider: Naoma Diener Other Clinician: Referring Provider: Naoma Diener Treating Provider/Extender: Maxwell Caul Weeks in Treatment: 5 Subjective History of Present Illness (HPI) 07/13/17; this is a 42 year old man who is a poorly controlled diabetic with a hemoglobin A1c of over 14. Recently transitioned from oral agents to the addition of  long-acting basal insulin. For the last 2 weeks he has had problems with ulcerations on his right foot this includes 2 shallow ulcerations one just distal to the ankle and a smaller one distal to this and a new recent blister just proximal to his toes. Last week his entire foot and lower ankle became intensely swollen and painful he was seen in the emergency room on 07/10/17 at the [facility] clinic in [facility]. Diagnosed with a diabetic foot infection and put on Augmentin. This has helped somewhat with the swelling but he is still having a fair amount of pain and yesterday found it difficult to walk. He has not been systemically unwell. ABIs in this clinic were 0.88 and on the right 0.7-1 the left. He will definitely need formal arterial studies 07/20/17; my end of the day note on this patient from last week totally neglected to mention wounds over his right second third and fourth PIPs in his right hand which she obtained while trying to punch away ice. We've been using silver alginate to these areas as well. The area on the fourth PIP is healed. Second is much better. Third still covered by a large amount of adherent nonviable tissue The 3 wound areas from last week including the distal blister all look a lot better. Erythema and edema in the foot is better. Culture I did was negative x-ray of the foot I did was negative of the right foot and ankle ARTERIAL STUDIES showed an ABI on the right of 0.73 on the left hip 0.92. However TBIs were normal at 0.75 on the right and 0.73 on the left. Waveforms were triphasic bilaterally at the posterior and anterior tibial. The wounds will have enough blood flow for healing but I thought this should come to the attention of vascular surgeon especially with the reduced ABI on the right vis--vis his uncontrolled diabetes. 07/27/17; the area on the second fourth PIP is closed. Third still open but much better. All the wounds on his right foot and ankle  look considerably  better. Using silver alginate all wound areas. He saw vascular surgery and has an angiogram booked for tomorrow with Dr. Wyn Quaker. He sees dermatology next week 08/03/17; ANGIOGRAPHY; the patient had angiography on 07/28/17 performed by Dr. dew. He had percutaneous transluminal angioplasty of the right anterior tibial artery. Percutaneous transluminal angioplasty of the right superficial femoral artery with stent placement in the proximal superficial femoral artery. He is still having some pain in his right thigh on the medial aspect. He has not noted any problems in the catheter site in the left femoral. He has 3 wounds 2 on the or so foot, dorsal ankle and on the anterior tibial area. All of these are measuring smaller 08/10/17; patient arrives with the areas on his hands totally healed. The area on his dorsal foot and dorsal ankle are also healed. He still has one open area on the dorsal right calf just above the ankle which is still open. We've been using silver alginate. 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 08/17/17; the patient is been to see dermatology with regards to the pigmented macules and papules on his bilateral anterior shins and arms. I don't see a specific diagnosis but they did do a biopsy. 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 The remaining wound just above the lateral right ankle. Dimensions are considerably smaller but is still not closed. We've been using silver alginate. Ryan Leonard, Ryan Leonard ([id number]) Objective Constitutional Patient is hypertensive.. Pulse regular and within target range for patient.Marland Kitchen Respirations regular, non-labored and within target range.. Temperature is normal and within the target range for the patient.Marland Kitchen appears in no distress. Vitals Time Taken: 9:41 AM, Height: 72 in, Weight: 226.5 lbs, BMI: 30.7, Temperature: 97.7 F, Pulse: 74 bpm,  Respiratory Rate: 20 breaths/min, Blood Pressure: 149/81 mmHg. Eyes Conjunctivae clear. No discharge. Respiratory Respiratory effort is easy and symmetric bilaterally. Rate is normal at rest and on room air.. Cardiovascular pedal pulses are palpable on the right. there is no edema. Lymphatic none palpable in the popliteal area. General Notes: wound exam; he has one remaining open area on the right anterior tibia area just above the ankle. This has healthy granulation. Dimensions are down from last time. No evidence of surrounding infection Integumentary (Hair, Skin) widespread pigmented macules and papules on the bilateral anterior tibias area as well as on his arms to a lesser extent. He has had a biopsy of an area on his left calf. Wound #3 status is Open. Original cause of wound was Gradually Appeared. The wound is located on the Right Lower Leg. The wound measures 1.2cm length x 0.7cm width x 0.1cm depth; 0.66cm^2 area and 0.066cm^3 volume. There is Fat Layer (Subcutaneous Tissue) Exposed exposed. There is no tunneling or undermining noted. There is a medium amount of serosanguineous drainage noted. The wound margin is distinct with the outline attached to the wound base. There is medium (34-66%) red granulation within the wound bed. There is a medium (34-66%) amount of necrotic tissue within the wound bed including Adherent Slough. The periwound skin appearance did not exhibit: Callus, Crepitus, Excoriation, Induration, Rash, Scarring, Dry/Scaly, Maceration, Atrophie Blanche, Cyanosis, Ecchymosis, Hemosiderin Staining, Mottled, Pallor, Rubor, Erythema. Periwound temperature was noted as No Abnormality. The periwound has tenderness on palpation. 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 Cleanse wound with mild soap and water May Shower, gently pat wound dry prior to applying new dressing. Anesthetic (add to Medication List): Wound #3 Right Lower Leg: Topical Lidocaine 4% cream applied to wound bed prior to debridement (In Clinic Only). Primary Wound Dressing: Wound #3 Right Lower Leg: Silvercel Non-Adherent Secondary Dressing: Wound #3 Right Lower Leg: ABD pad Conform/Kerlix Other - tape, stretch netting #5 Dressing Change Frequency: Wound #3 Right Lower Leg: Change dressing every other day. Other: - as needed Follow-up Appointments: Wound #3 Right Lower Leg: Return Appointment in 1 week. Edema Control: Wound #3 Right Lower Leg: Elevate legs to the level of the heart and pump ankles as often as possible Additional Orders / Instructions: Wound #3 Right Lower Leg: Increase protein intake. OK to return to work Other: - Vitamin C, Zinc #1 I'm going to continue with silver alginate/JVD/Kerlix and Coban #2 there is really no reason to change the primary dressing here. His wound is smaller and his other woundson the right foot and ankle are healed and it remained healed. #3areas on his right hand [which happened because he was trying to break up ice] is also healed. #4 the patient went to see dermatology and they've done a biopsy without specific diagnosis at this point #5 he has been revascularized and is following with Dr. Wyn Quaker Electronic Signature(s) Signed: 08/17/2017 5:42:24 PM By: Baltazar Najjar MD Ryan Leonard, Ryan Leonard ([id number]) Entered By: Baltazar Najjar on 08/17/2017 10:26:31 Ryan Leonard  ([id number]) -------------------------------------------------------------------------------- SuperBill Details Patient Name: Ryan Leonard Date of Service: 08/17/2017 Medical Record Number: [id number] Patient Account Number: [id number] Date of Birth/Sex: 1976-01-13 (42 y.o. Male) Treating RN: Huel Coventry Primary Care Provider: Naoma Diener Other Clinician: Referring Provider: Naoma Diener Treating Provider/Extender: Maxwell Caul Weeks in Treatment: 5 Diagnosis Coding ICD-10 Codes Code Description E11.621 Type 2 diabetes mellitus with foot ulcer L03.115 Cellulitis of right lower limb L97.511 Non-pressure chronic ulcer of other part of right foot limited to breakdown of skin E11.51 Type 2 diabetes mellitus with diabetic peripheral angiopathy without gangrene E11.42 Type 2 diabetes mellitus with diabetic polyneuropathy S61.411D Laceration without foreign body of right hand, subsequent encounter L97.211 Non-pressure chronic ulcer of right calf limited to breakdown of skin Facility Procedures CPT4 Code: [id number] Description: 32440 - WOUND CARE VISIT-LEV 2 EST PT Modifier: Quantity: 1 Physician Procedures CPT4 Code: [id number] Description: 99213 - WC PHYS LEVEL 3 - EST PT ICD-10 Diagnosis Description E11.621 Type 2 diabetes mellitus with foot ulcer 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

## 2017-08-19 NOTE — Progress Notes (Signed)
Ryan, Leonard ([id number]) Visit Report for 08/17/2017 Arrival Information Details Patient Name: Ryan Leonard, Ryan Leonard Date of Service: 08/17/2017 9:30 AM Medical Record Number: [id number] Patient Account Number: [id number] Date of Birth/Sex: 11-07-1975 (42 y.o. Male) Treating RN: Phillis Haggis Primary Care Taylah Dubiel: Naoma Diener Other Clinician: Referring Lurlene Ronda: Naoma Diener Treating Mysti Haley/Extender: Altamese [place] in Treatment: 5 Visit Information History Since Last Visit All ordered tests and consults were completed: No Patient Arrived: Ambulatory Added or deleted any medications: No Arrival Time: 09:39 Any new allergies or adverse reactions: No Accompanied By: son Had a fall or experienced change in No Transfer Assistance: None activities of daily living that may affect Patient Identification Verified: Yes risk of falls: Secondary Verification Process Completed: Yes Signs or symptoms of abuse/neglect since last visito No Patient Requires Transmission-Based No Hospitalized since last visit: No Precautions: Has Dressing in Place as Prescribed: Yes Patient Has Alerts: Yes Pain Present Now: No Patient Alerts: DM II Electronic Signature(s) Signed: 08/17/2017 4:40:08 PM By: Alejandro Mulling Entered By: Alejandro Mulling on 08/17/2017 09:39:46 Ryan Leonard ([id number]) -------------------------------------------------------------------------------- Clinic Level of Care Assessment Details Patient Name: Ryan Leonard Date of Service: 08/17/2017 9:30 AM Medical Record Number: [id number] Patient Account Number: [id number] Date of Birth/Sex: November 16, 1975 (42 y.o. Male) Treating RN: Huel Coventry Primary Care Rickie Gange: Naoma Diener Other Clinician: Referring Veryl Winemiller: Naoma Diener Treating Samaira Holzworth/Extender: Altamese Guadalupe Guerra in Treatment: 5 Clinic Level of Care Assessment Items TOOL 4 Quantity Score []  - Use when only an EandM is performed on FOLLOW-UP  visit 0 ASSESSMENTS - Nursing Assessment / Reassessment []  - Reassessment of Co-morbidities (includes updates in patient status) 0 X- 1 5 Reassessment of Adherence to Treatment Plan ASSESSMENTS - Wound and Skin Assessment / Reassessment X - Simple Wound Assessment / Reassessment - one wound 1 5 []  - 0 Complex Wound Assessment / Reassessment - multiple wounds []  - 0 Dermatologic / Skin Assessment (not related to wound area) ASSESSMENTS - Focused Assessment []  - Circumferential Edema Measurements - multi extremities 0 []  - 0 Nutritional Assessment / Counseling / Intervention []  - 0 Lower Extremity Assessment (monofilament, tuning fork, pulses) []  - 0 Peripheral Arterial Disease Assessment (using hand held doppler) ASSESSMENTS - Ostomy and/or Continence Assessment and Care []  - Incontinence Assessment and Management 0 []  - 0 Ostomy Care Assessment and Management (repouching, etc.) PROCESS - Coordination of Care X - Simple Patient / Family Education for ongoing care 1 15 []  - 0 Complex (extensive) Patient / Family Education for ongoing care []  - 0 Staff obtains Chiropractor, Records, Test Results / Process Orders []  - 0 Staff telephones HHA, Nursing Homes / Clarify orders / etc []  - 0 Routine Transfer to another Facility (non-emergent condition) []  - 0 Routine Hospital Admission (non-emergent condition) []  - 0 New Admissions / Manufacturing engineer / Ordering NPWT, Apligraf, etc. []  - 0 Emergency Hospital Admission (emergent condition) X- 1 10 Simple Discharge Coordination DOYE, MONTILLA ([id number]) []  - 0 Complex (extensive) Discharge Coordination PROCESS - Special Needs []  - Pediatric / Minor Patient Management 0 []  - 0 Isolation Patient Management []  - 0 Hearing / Language / Visual special needs []  - 0 Assessment of Community assistance (transportation, D/C planning, etc.) []  - 0 Additional assistance / Altered mentation []  - 0 Support Surface(s) Assessment (bed,  cushion, seat, etc.) INTERVENTIONS - Wound Cleansing / Measurement X - Simple Wound Cleansing - one wound 1 5 []  - 0 Complex Wound Cleansing - multiple wounds X- 1 5 Wound Imaging (photographs - any number  of wounds) []  - 0 Wound Tracing (instead of photographs) X- 1 5 Simple Wound Measurement - one wound []  - 0 Complex Wound Measurement - multiple wounds INTERVENTIONS - Wound Dressings []  - Small Wound Dressing one or multiple wounds 0 X- 1 15 Medium Wound Dressing one or multiple wounds []  - 0 Large Wound Dressing one or multiple wounds []  - 0 Application of Medications - topical []  - 0 Application of Medications - injection INTERVENTIONS - Miscellaneous []  - External ear exam 0 []  - 0 Specimen Collection (cultures, biopsies, blood, body fluids, etc.) []  - 0 Specimen(s) / Culture(s) sent or taken to Lab for analysis []  - 0 Patient Transfer (multiple staff / Nurse, adultHoyer Lift / Similar devices) []  - 0 Simple Staple / Suture removal (25 or less) []  - 0 Complex Staple / Suture removal (26 or more) []  - 0 Hypo / Hyperglycemic Management (close monitor of Blood Glucose) []  - 0 Ankle / Brachial Index (ABI) - do not check if billed separately X- 1 5 Vital Signs Ryan ChurchWILLIAMS, Nazim ([id number]) Has the patient been seen at the hospital within the last three years: Yes Total Score: 70 Level Of Care: New/Established - Level 2 Electronic Signature(s) Signed: 08/17/2017 5:38:59 PM By: Elliot GurneyWoody, BSN, RN, CWS, Kim RN, BSN Entered By: Elliot GurneyWoody, BSN, RN, CWS, Kim on 08/17/2017 10:01:17 Ryan ChurchWILLIAMS, Ryan Leonard ([id number]) -------------------------------------------------------------------------------- Encounter Discharge Information Details Patient Name: Ryan ChurchWILLIAMS, Ryan Leonard Date of Service: 08/17/2017 9:30 AM Medical Record Number: [id number] Patient Account Number: [id number] Date of Birth/Sex: [birth year] [age] y.o. Male) Treating RN: Huel CoventryWoody, Kim Primary Care Zlaty Alexa: Naoma DienerKOMIVES, EUGENIE Other  Clinician: Referring Zandon Talton: Naoma DienerKOMIVES, EUGENIE Treating Audi Wettstein/Extender: Altamese CarolinaOBSON, MICHAEL G Weeks in Treatment: 5 Encounter Discharge Information Items Discharge Pain Level: 0 Discharge Condition: Stable Ambulatory Status: Ambulatory Discharge Destination: Home Transportation: Private Auto Accompanied By: self Schedule Follow-up Appointment: Yes Medication Reconciliation completed and No provided to Patient/Care Zaine Elsass: Provided on Clinical Summary of Care: 08/17/2017 Form Type Recipient Paper Patient BW Electronic Signature(s) Signed: 08/17/2017 10:12:47 AM By: Curtis Sitesorthy, Joanna Entered By: Curtis Sitesorthy, Joanna on 08/17/2017 10:12:47 Ryan ChurchWILLIAMS, Race ([id number]) -------------------------------------------------------------------------------- Lower Extremity Assessment Details Patient Name: Ryan ChurchWILLIAMS, Temesgen Date of Service: 08/17/2017 9:30 AM Medical Record Number: [id number] Patient Account Number: [id number] Date of Birth/Sex: [birth year] [age] y.o. Male) Treating RN: Phillis HaggisPinkerton, Debi Primary Care Hasani Diemer: Naoma DienerKOMIVES, EUGENIE Other Clinician: Referring Felipa Laroche: Naoma DienerKOMIVES, EUGENIE Treating Arif Amendola/Extender: Maxwell CaulOBSON, MICHAEL G Weeks in Treatment: 5 Vascular Assessment Pulses: Dorsalis Pedis Palpable: [Right:Yes] Posterior Tibial Extremity colors, hair growth, and conditions: Extremity Color: [Right:Hyperpigmented] Temperature of Extremity: [Right:Warm] Capillary Refill: [Right:< 3 seconds] Toe Nail Assessment Left: Right: Thick: No Discolored: No Deformed: No Improper Length and Hygiene: No Electronic Signature(s) Signed: 08/17/2017 4:40:08 PM By: Alejandro MullingPinkerton, Debra Entered By: Alejandro MullingPinkerton, Debra on 08/17/2017 09:48:01 Ryan ChurchWILLIAMS, Tsuneo ([id number]) -------------------------------------------------------------------------------- Multi Wound Chart Details Patient Name: Ryan ChurchWILLIAMS, Keevin Date of Service: 08/17/2017 9:30 AM Medical Record Number: [id number] Patient Account Number:  [id number] Date of Birth/Sex: [birth year] [age] y.o. Male) Treating RN: Huel CoventryWoody, Kim Primary Care Irvin Lizama: Naoma DienerKOMIVES, EUGENIE Other Clinician: Referring Rayma Hegg: Naoma DienerKOMIVES, EUGENIE Treating Ival Pacer/Extender: Maxwell CaulOBSON, MICHAEL G Weeks in Treatment: 5 Vital Signs Height(in): 72 Pulse(bpm): 74 Weight(lbs): 226.5 Blood Pressure(mmHg): 149/81 Body Mass Index(BMI): 31 Temperature(F): 97.7 Respiratory Rate 20 (breaths/min): Photos: [3:No Photos] [N/A:N/A] Wound Location: [3:Right Lower Leg] [N/A:N/A] Wounding Event: [3:Gradually Appeared] [N/A:N/A] Primary Etiology: [3:Diabetic Wound/Ulcer of the Lower Extremity] [N/A:N/A] Comorbid History: [3:Asthma, Hypertension, Type II Diabetes, Neuropathy] [N/A:N/A] Date Acquired: [3:06/29/2017] [N/A:N/A] Weeks of Treatment: [3:5] [N/A:N/A] Wound Status: [3:Open] [N/A:N/A] Pending Amputation on [3:Yes] [N/A:N/A] Presentation: Measurements  L x W x D [3:1.2x0.7x0.1] [N/A:N/A] (cm) Area (cm) : [3:0.66] [N/A:N/A] Volume (cm) : [3:0.066] [N/A:N/A] % Reduction in Area: [3:77.50%] [N/A:N/A] % Reduction in Volume: [3:77.60%] [N/A:N/A] Classification: [3:Grade 2] [N/A:N/A] Exudate Amount: [3:Medium] [N/A:N/A] Exudate Type: [3:Serosanguineous] [N/A:N/A] Exudate Color: [3:red, brown] [N/A:N/A] Wound Margin: [3:Distinct, outline attached] [N/A:N/A] Granulation Amount: [3:Medium (34-66%)] [N/A:N/A] Granulation Quality: [3:Red] [N/A:N/A] Necrotic Amount: [3:Medium (34-66%)] [N/A:N/A] Exposed Structures: [3:Fat Layer (Subcutaneous Tissue) Exposed: Yes Fascia: No Tendon: No Muscle: No Joint: No Bone: No] [N/A:N/A] Epithelialization: [3:None] [N/A:N/A] Periwound Skin Texture: [3:Excoriation: No Induration: No Callus: No] [N/A:N/A] Crepitus: No Rash: No Scarring: No Periwound Skin Moisture: Maceration: No N/A N/A Dry/Scaly: No Periwound Skin Color: Atrophie Blanche: No N/A N/A Cyanosis: No Ecchymosis: No Erythema: No Hemosiderin Staining: No Mottled:  No Pallor: No Rubor: No Temperature: No Abnormality N/A N/A Tenderness on Palpation: Yes N/A N/A Wound Preparation: Ulcer Cleansing: N/A N/A Rinsed/Irrigated with Saline Topical Anesthetic Applied: Other: lidocaine 4% Treatment Notes Wound #3 (Right Lower Leg) 1. Cleansed with: Clean wound with Normal Saline 2. Anesthetic Topical Lidocaine 4% cream to wound bed prior to debridement 4. Dressing Applied: Other dressing (specify in notes) 5. Secondary Dressing Applied ABD Pad Kerlix/Conform 7. Secured with Tape Notes silvercel, stretch netting Electronic Signature(s) Signed: 08/17/2017 5:42:24 PM By: Baltazar Najjar MD Entered By: Baltazar Najjar on 08/17/2017 10:17:33 Ryan Leonard ([id number]) -------------------------------------------------------------------------------- Multi-Disciplinary Care Plan Details Patient Name: Ryan Leonard Date of Service: 08/17/2017 9:30 AM Medical Record Number: [id number] Patient Account Number: [id number] Date of Birth/Sex: 01-Jan-1976 (42 y.o. Male) Treating RN: Huel Coventry Primary Care Celicia Minahan: Naoma Diener Other Clinician: Referring Talulah Schirmer: Naoma Diener Treating Adolf Ormiston/Extender: Altamese [place] in Treatment: 5 Active Inactive ` Nutrition Nursing Diagnoses: Imbalanced nutrition Impaired glucose control: actual or potential Potential for alteratiion in Nutrition/Potential for imbalanced nutrition Goals: Patient/caregiver agrees to and verbalizes understanding of need to use nutritional supplements and/or vitamins as prescribed Date Initiated: 07/13/2017 Target Resolution Date: 11/12/2017 Goal Status: Active Patient/caregiver will maintain therapeutic glucose control Date Initiated: 07/13/2017 Target Resolution Date: 11/12/2017 Goal Status: Active Interventions: Assess patient nutrition upon admission and as needed per policy Provide education on elevated blood sugars and impact on wound healing Provide education  on nutrition Treatment Activities: Education provided on Nutrition : 07/13/2017 Notes: ` Orientation to the Wound Care Program Nursing Diagnoses: Knowledge deficit related to the wound healing center program Goals: Patient/caregiver will verbalize understanding of the Wound Healing Center Program Date Initiated: 07/13/2017 Target Resolution Date: 08/13/2017 Goal Status: Active Interventions: Provide education on orientation to the wound center Notes: CAM, DAUPHIN ([id number]) Pain, Acute or Chronic Nursing Diagnoses: Pain, acute or chronic: actual or potential Potential alteration in comfort, pain Goals: Patient/caregiver will verbalize adequate pain control between visits Date Initiated: 07/13/2017 Target Resolution Date: 11/12/2017 Goal Status: Active Interventions: Complete pain assessment as per visit requirements Encourage patient to take pain medications as prescribed Notes: ` Soft Tissue Infection Nursing Diagnoses: Impaired tissue integrity Knowledge deficit related to disease process and management Knowledge deficit related to home infection control: handwashing, handling of soiled dressings, supply storage Potential for infection: soft tissue Goals: Patient will remain free of wound infection Date Initiated: 07/13/2017 Target Resolution Date: 11/12/2017 Goal Status: Active Patient/caregiver will verbalize understanding of or measures to prevent infection and contamination in the home setting Date Initiated: 07/13/2017 Target Resolution Date: 11/12/2017 Goal Status: Active Interventions: Assess signs and symptoms of infection every visit Provide education on infection Treatment Activities: Education provided on Infection : 07/13/2017 Notes: ` Wound/Skin  Impairment Nursing Diagnoses: Impaired tissue integrity Knowledge deficit related to ulceration/compromised skin integrity Goals: Ulcer/skin breakdown will have a volume reduction of 80% by week 12 Date Initiated:  07/13/2017 Target Resolution Date: 11/12/2017 BARRE, AYDELOTT ([id number]) Goal Status: Active Interventions: Assess patient/caregiver ability to perform ulcer/skin care regimen upon admission and as needed Assess ulceration(s) every visit Notes: Electronic Signature(s) Signed: 08/17/2017 5:38:59 PM By: Elliot Gurney, BSN, RN, CWS, Kim RN, BSN Entered By: Elliot Gurney, BSN, RN, CWS, Kim on 08/17/2017 10:00:14 Ryan Leonard ([id number]) -------------------------------------------------------------------------------- Pain Assessment Details Patient Name: Ryan Leonard Date of Service: 08/17/2017 9:30 AM Medical Record Number: [id number] Patient Account Number: [id number] Date of Birth/Sex: Oct 10, 1975 (42 y.o. Male) Treating RN: Phillis Haggis Primary Care Luzelena Heeg: Naoma Diener Other Clinician: Referring Rorey Bisson: Naoma Diener Treating Lovely Kerins/Extender: Maxwell Caul Weeks in Treatment: 5 Active Problems Location of Pain Severity and Description of Pain Patient Has Paino No Site Locations Pain Management and Medication Current Pain Management: Electronic Signature(s) Signed: 08/17/2017 4:40:08 PM By: Alejandro Mulling Entered By: Alejandro Mulling on 08/17/2017 09:39:52 Ryan Leonard ([id number]) -------------------------------------------------------------------------------- Patient/Caregiver Education Details Patient Name: Ryan Leonard Date of Service: 08/17/2017 9:30 AM Medical Record Number: [id number] Patient Account Number: [id number] Date of Birth/Gender: 1976-03-19 (42 y.o. Male) Treating RN: Curtis Sites Primary Care Physician: Naoma Diener Other Clinician: Referring Physician: Naoma Diener Treating Physician/Extender: Altamese [place] in Treatment: 5 Education Assessment Education Provided To: Patient Education Topics Provided Wound/Skin Impairment: Handouts: Other: wound care as ordered Methods: Demonstration, Explain/Verbal Responses: State  content correctly Electronic Signature(s) Signed: 08/17/2017 5:19:26 PM By: Curtis Sites Entered By: Curtis Sites on 08/17/2017 10:13:12 Ryan Leonard ([id number]) -------------------------------------------------------------------------------- Wound Assessment Details Patient Name: Ryan Leonard Date of Service: 08/17/2017 9:30 AM Medical Record Number: [id number] Patient Account Number: [id number] Date of Birth/Sex: 05/20/1976 (42 y.o. Male) Treating RN: Phillis Haggis Primary Care Jonee Lamore: Naoma Diener Other Clinician: Referring Tiffanee Mcnee: Naoma Diener Treating Jerica Creegan/Extender: Maxwell Caul Weeks in Treatment: 5 Wound Status Wound Number: 3 Primary Diabetic Wound/Ulcer of the Lower Etiology: Extremity Wound Location: Right Lower Leg Wound Status: Open Wounding Event: Gradually Appeared Comorbid Asthma, Hypertension, Type II Diabetes, Date Acquired: 06/29/2017 History: Neuropathy Weeks Of Treatment: 5 Clustered Wound: No Pending Amputation On Presentation Photos Photo Uploaded By: Alejandro Mulling on 08/17/2017 17:21:07 Wound Measurements Length: (cm) 1.2 Width: (cm) 0.7 Depth: (cm) 0.1 Area: (cm) 0.66 Volume: (cm) 0.066 % Reduction in Area: 77.5% % Reduction in Volume: 77.6% Epithelialization: None Tunneling: No Undermining: No Wound Description Classification: Grade 2 Wound Margin: Distinct, outline attached Exudate Amount: Medium Exudate Type: Serosanguineous Exudate Color: red, brown Foul Odor After Cleansing: No Slough/Fibrino Yes Wound Bed Granulation Amount: Medium (34-66%) Exposed Structure Granulation Quality: Red Fascia Exposed: No Necrotic Amount: Medium (34-66%) Fat Layer (Subcutaneous Tissue) Exposed: Yes Necrotic Quality: Adherent Slough Tendon Exposed: No Muscle Exposed: No Joint Exposed: No Bone Exposed: No Periwound Skin Texture Hissong, Raeshawn ([id number]) Texture Color No Abnormalities Noted: No No Abnormalities  Noted: No Callus: No Atrophie Blanche: No Crepitus: No Cyanosis: No Excoriation: No Ecchymosis: No Induration: No Erythema: No Rash: No Hemosiderin Staining: No Scarring: No Mottled: No Pallor: No Moisture Rubor: No No Abnormalities Noted: No Dry / Scaly: No Temperature / Pain Maceration: No Temperature: No Abnormality Tenderness on Palpation: Yes Wound Preparation Ulcer Cleansing: Rinsed/Irrigated with Saline Topical Anesthetic Applied: Other: lidocaine 4%, Treatment Notes Wound #3 (Right Lower Leg) 1. Cleansed with: Clean wound with Normal Saline 2. Anesthetic Topical Lidocaine 4% cream to wound bed prior to debridement 4. Dressing Applied: Other dressing (  specify in notes) 5. Secondary Dressing Applied ABD Pad Kerlix/Conform 7. Secured with Tape Notes silvercel, stretch netting Electronic Signature(s) Signed: 08/17/2017 4:40:08 PM By: Alejandro Mulling Entered By: Alejandro Mulling on 08/17/2017 09:47:17 Ryan Leonard ([id number]) -------------------------------------------------------------------------------- Vitals Details Patient Name: Ryan Leonard Date of Service: 08/17/2017 9:30 AM Medical Record Number: [id number] Patient Account Number: [id number] Date of Birth/Sex: February 12, 1976 (42 y.o. Male) Treating RN: Ashok Cordia, Debi Primary Care Cheyne Boulden: Naoma Diener Other Clinician: Referring Johanne Mcglade: Naoma Diener Treating Amaka Gluth/Extender: Maxwell Caul Weeks in Treatment: 5 Vital Signs Time Taken: 09:41 Temperature (F): 97.7 Height (in): 72 Pulse (bpm): 74 Weight (lbs): 226.5 Respiratory Rate (breaths/min): 20 Body Mass Index (BMI): 30.7 Blood Pressure (mmHg): 149/81 Reference Range: 80 - 120 mg / dl Electronic Signature(s) Signed: 08/17/2017 4:40:08 PM By: Alejandro Mulling Entered By: Alejandro Mulling on 08/17/2017 09:42:15

## 2017-08-24 ENCOUNTER — Encounter: Payer: Managed Care, Other (non HMO) | Admitting: Nurse Practitioner

## 2017-08-24 DIAGNOSIS — E11621 Type 2 diabetes mellitus with foot ulcer: Secondary | ICD-10-CM | POA: Diagnosis not present

## 2017-08-25 ENCOUNTER — Other Ambulatory Visit (INDEPENDENT_AMBULATORY_CARE_PROVIDER_SITE_OTHER): Payer: Self-pay | Admitting: Vascular Surgery

## 2017-08-25 DIAGNOSIS — I739 Peripheral vascular disease, unspecified: Secondary | ICD-10-CM

## 2017-08-26 ENCOUNTER — Ambulatory Visit (INDEPENDENT_AMBULATORY_CARE_PROVIDER_SITE_OTHER): Payer: Managed Care, Other (non HMO)

## 2017-08-26 ENCOUNTER — Encounter (INDEPENDENT_AMBULATORY_CARE_PROVIDER_SITE_OTHER): Payer: Self-pay | Admitting: Vascular Surgery

## 2017-08-26 ENCOUNTER — Ambulatory Visit (INDEPENDENT_AMBULATORY_CARE_PROVIDER_SITE_OTHER): Payer: Managed Care, Other (non HMO) | Admitting: Vascular Surgery

## 2017-08-26 VITALS — BP 161/88 | HR 74 | Resp 16 | Ht 72.0 in | Wt 222.0 lb

## 2017-08-26 DIAGNOSIS — I10 Essential (primary) hypertension: Secondary | ICD-10-CM

## 2017-08-26 DIAGNOSIS — I739 Peripheral vascular disease, unspecified: Secondary | ICD-10-CM

## 2017-08-26 DIAGNOSIS — I7025 Atherosclerosis of native arteries of other extremities with ulceration: Secondary | ICD-10-CM | POA: Diagnosis not present

## 2017-08-26 DIAGNOSIS — E1159 Type 2 diabetes mellitus with other circulatory complications: Secondary | ICD-10-CM | POA: Diagnosis not present

## 2017-08-26 DIAGNOSIS — E785 Hyperlipidemia, unspecified: Secondary | ICD-10-CM | POA: Diagnosis not present

## 2017-08-26 NOTE — Progress Notes (Signed)
Subjective:    Patient ID: Ryan Leonard, male    DOB: Dec 25, 1975, 42 y.o.   MRN: [id number] Chief Complaint  Patient presents with  . Follow-up    3 week f/u post angio ABI   Patient presents for his first post procedure follow-up.  The patient is status post a right lower extremity angiogram with angioplasty and stent placement on July 28, 2017 for peripheral artery disease with ulceration of the right toe.  The patient presents today without complaint.  The patient notes an improvement in the pain in wound healing status of his right lower extremity.  The patient denies any claudication-like symptoms, rest pain or new/worsening ulcer status.  The patient continues to receive wound care from podiatry.  The patient underwent a bilateral ABI which was notable for an improvement in the right lower extremity arterial flow.  There is now no evidence of significant right lower extremity arterial disease.  The toe brachial index is normal.  There is mild left lower extremity arterial disease.  Bilateral triphasic tibials.  The patient denies any fever, nausea vomiting.  Review of Systems  Constitutional: Negative.   HENT: Negative.   Eyes: Negative.   Respiratory: Negative.   Cardiovascular: Negative.   Gastrointestinal: Negative.   Endocrine: Negative.   Genitourinary: Negative.   Musculoskeletal: Negative.   Skin: Positive for wound.  Allergic/Immunologic: Negative.   Neurological: Negative.   Hematological: Negative.   Psychiatric/Behavioral: Negative.       Objective:   Physical Exam  Constitutional: He is oriented to person, place, and time. He appears well-developed and well-nourished. No distress.  HENT:  Head: Normocephalic and atraumatic.  Eyes: Pupils are equal, round, and reactive to light. Conjunctivae are normal.  Neck: Normal range of motion.  Cardiovascular: Normal rate, regular rhythm, normal heart sounds and intact distal pulses.  Pulses:      Radial pulses are  2+ on the right side, and 2+ on the left side.       Dorsalis pedis pulses are 2+ on the right side, and 2+ on the left side.       Posterior tibial pulses are 2+ on the right side, and 2+ on the left side.  Pulmonary/Chest: Effort normal and breath sounds normal.  Musculoskeletal: He exhibits no edema.  Neurological: He is alert and oriented to person, place, and time.  Skin: He is not diaphoretic.  Ulcerations covered with the wound center dressings.  There is no drainage noted to the dressings.  There is no surrounding erythema.  No foul odor. There is no cellulitis.   Psychiatric: He has a normal mood and affect. His behavior is normal. Judgment and thought content normal.  Vitals reviewed.  BP (!) 161/88 (BP Location: Right Arm, Patient Position: Sitting)   Pulse 74   Resp 16   Ht 6' (1.829 m)   Wt 222 lb (100.7 kg)   BMI 30.11 kg/m   Past Medical History:  Diagnosis Date  . Depression   . Diabetes mellitus without complication (HCC)   . Hyperlipidemia   . Hypertension   . Peripheral vascular disease (HCC)    Social History   Socioeconomic History  . Marital status: Married    Spouse name: Not on file  . Number of children: Not on file  . Years of education: Not on file  . Highest education level: Not on file  Occupational History  . Not on file  Social Needs  . Financial resource strain: Not on file  .  Food insecurity:    Worry: Not on file    Inability: Not on file  . Transportation needs:    Medical: Not on file    Non-medical: Not on file  Tobacco Use  . Smoking status: Never Smoker  . Smokeless tobacco: Never Used  Substance and Sexual Activity  . Alcohol use: No    Frequency: Never  . Drug use: No  . Sexual activity: Not on file  Lifestyle  . Physical activity:    Days per week: Not on file    Minutes per session: Not on file  . Stress: Not on file  Relationships  . Social connections:    Talks on phone: Not on file    Gets together: Not on file     Attends religious service: Not on file    Active member of club or organization: Not on file    Attends meetings of clubs or organizations: Not on file    Relationship status: Not on file  . Intimate partner violence:    Fear of current or ex partner: Not on file    Emotionally abused: Not on file    Physically abused: Not on file    Forced sexual activity: Not on file  Other Topics Concern  . Not on file  Social History Narrative  . Not on file   Past Surgical History:  Procedure Laterality Date  . EYE SURGERY    . LOWER EXTREMITY ANGIOGRAPHY Right 07/28/2017   Procedure: LOWER EXTREMITY ANGIOGRAPHY;  Surgeon: Annice Needyew, Jason S, MD;  Location: [facility];  Service: Cardiovascular;  Laterality: Right;  . WISDOM TOOTH EXTRACTION     Family History  Problem Relation Age of Onset  . Diabetes Maternal Grandmother   . Colon cancer Maternal Grandfather    Allergies  Allergen Reactions  . Codeine Nausea Only      Assessment & Plan:  Patient presents for his first post procedure follow-up.  The patient is status post a right lower extremity angiogram with angioplasty and stent placement on July 28, 2017 for peripheral artery disease with ulceration of the right toe.  The patient presents today without complaint.  The patient notes an improvement in the pain in wound healing status of his right lower extremity.  The patient denies any claudication-like symptoms, rest pain or new/worsening ulcer status.  The patient continues to receive wound care from podiatry.  The patient underwent a bilateral ABI which was notable for an improvement in the right lower extremity arterial flow.  There is now no evidence of significant right lower extremity arterial disease.  The toe brachial index is normal.  There is mild left lower extremity arterial disease.  Bilateral triphasic tibials.  The patient denies any fever, nausea vomiting.  1. Atherosclerosis of native arteries of the extremities  with ulceration (HCC) - Stable Patient presents for his first post procedure follow-up The patient is status post a right lower extremity angiogram with angioplasty and stent placement for severe peripheral artery disease with associated toe ulceration The patient reports an improvement in the discomfort to his right lower extremity and improved wound healing status ABI today with improved arterial blood flow The patient is to follow-up in 6 months with a repeat ABI and right lower extremity arterial duplex Patient to remain abstinent of tobacco use. I have discussed with the patient at length the risk factors for and pathogenesis of atherosclerotic disease and encouraged a healthy diet, regular exercise regimen and blood pressure /  glucose control.  The patient was encouraged to call the office in the interim if he experiences any claudication like symptoms, rest pain or ulcers to his feet / toes.  - VAS [place] ABI WITH/WO TBI; Future - VAS [place] LOWER EXTREMITY ARTERIAL DUPLEX; Future  2. Type 2 diabetes mellitus with other circulatory complication, unspecified whether long term insulin use (HCC) - Stable Encouraged good control as its slows the progression of atherosclerotic disease  3. Hyperlipidemia, unspecified hyperlipidemia type - Stable Encouraged good control as its slows the progression of atherosclerotic disease  4. Essential hypertension - Stable Encouraged good control as its slows the progression of atherosclerotic disease  Current Outpatient Medications on File Prior to Visit  Medication Sig Dispense Refill  . amLODipine (NORVASC) 5 MG tablet Take 5 mg by mouth daily.    Marland Kitchen aspirin EC 81 MG tablet Take 1 tablet (81 mg total) by mouth daily. 150 tablet 2  . atorvastatin (LIPITOR) 20 MG tablet TAKE 1 TABLET (20 MG TOTAL) BY MOUTH ONCE DAILY. qhs  11  . Cholecalciferol (VITAMIN D) 2000 units tablet Take by mouth.    . clopidogrel (PLAVIX) 75 MG tablet Take 1 tablet (75 mg total) by  mouth daily. 30 tablet 11  . doxycycline (MONODOX) 100 MG capsule doxycycline monohydrate 100 mg capsule    . FLUoxetine (PROZAC) 10 MG tablet Take 10 mg by mouth daily.  3  . glipiZIDE (GLUCOTROL) 10 MG tablet TAKE 1 TABLET (10 MG TOTAL) BY MOUTH 2 (TWO) TIMES DAILY BEFORE MEALS.  3  . hydrochlorothiazide (HYDRODIURIL) 12.5 MG tablet hydrochlorothiazide 25 mg tablet    . lisinopril (PRINIVIL,ZESTRIL) 10 MG tablet Take 10 mg by mouth.    . metFORMIN (GLUCOPHAGE) 500 MG tablet Take 500 mg by mouth 2 (two) times daily with a meal.     . Vitamin D, Ergocalciferol, (DRISDOL) 50000 units CAPS capsule Take 50,000 Units by mouth every 7 (seven) days.    . canagliflozin (INVOKANA) 100 MG TABS tablet Invokana 100 mg tablet    . Insulin Glargine (BASAGLAR KWIKPEN) 100 UNIT/ML SOPN Inject into the skin.     No current facility-administered medications on file prior to visit.    There are no Patient Instructions on file for this visit. No follow-ups on file.  Zayra Devito A Ermagene Saidi, PA-C

## 2017-08-28 NOTE — Progress Notes (Signed)
TAEQUAN, STOCKHAUSEN ([id number]) Visit Report for 08/24/2017 Arrival Information Details Patient Name: Ryan Leonard, Ryan Leonard Date of Service: 08/24/2017 9:15 AM Medical Record Number: [id number] Patient Account Number: [id number] Date of Birth/Sex: 20-May-1976 (42 y.o. M) Treating RN: Renne Crigler Primary Care Dellene Mcgroarty: Naoma Diener Other Clinician: Referring Nike Southers: Naoma Diener Treating Myriah Boggus/Extender: Kathreen Cosier in Treatment: 6 Visit Information History Since Last Visit All ordered tests and consults were completed: No Patient Arrived: Ambulatory Added or deleted any medications: No Arrival Time: 09:15 Any new allergies or adverse reactions: No Accompanied By: son Had a fall or experienced change in No Transfer Assistance: None activities of daily living that may affect Patient Identification Verified: Yes risk of falls: Secondary Verification Process Completed: Yes Signs or symptoms of abuse/neglect since last visito No Patient Requires Transmission-Based No Hospitalized since last visit: No Precautions: Pain Present Now: No Patient Has Alerts: Yes Patient Alerts: DM II Electronic Signature(s) Signed: 08/24/2017 10:16:36 AM By: Renne Crigler Entered By: Renne Crigler on 08/24/2017 09:16:13 Ryan Leonard ([id number]) -------------------------------------------------------------------------------- Clinic Level of Care Assessment Details Patient Name: Ryan Leonard Date of Service: 08/24/2017 9:15 AM Medical Record Number: [id number] Patient Account Number: [id number] Date of Birth/Sex: Dec 05, 1975 (42 y.o. M) Treating RN: Huel Coventry Primary Care Evalina Tabak: Naoma Diener Other Clinician: Referring Panzy Bubeck: Naoma Diener Treating Lemoyne Nestor/Extender: Kathreen Cosier in Treatment: 6 Clinic Level of Care Assessment Items TOOL 4 Quantity Score []  - Use when only an EandM is performed on FOLLOW-UP visit 0 ASSESSMENTS - Nursing Assessment /  Reassessment []  - Reassessment of Co-morbidities (includes updates in patient status) 0 []  - 0 Reassessment of Adherence to Treatment Plan ASSESSMENTS - Wound and Skin Assessment / Reassessment X - Simple Wound Assessment / Reassessment - one wound 1 5 []  - 0 Complex Wound Assessment / Reassessment - multiple wounds []  - 0 Dermatologic / Skin Assessment (not related to wound area) ASSESSMENTS - Focused Assessment []  - Circumferential Edema Measurements - multi extremities 0 []  - 0 Nutritional Assessment / Counseling / Intervention []  - 0 Lower Extremity Assessment (monofilament, tuning fork, pulses) []  - 0 Peripheral Arterial Disease Assessment (using hand held doppler) ASSESSMENTS - Ostomy and/or Continence Assessment and Care []  - Incontinence Assessment and Management 0 []  - 0 Ostomy Care Assessment and Management (repouching, etc.) PROCESS - Coordination of Care []  - Simple Patient / Family Education for ongoing care 0 []  - 0 Complex (extensive) Patient / Family Education for ongoing care []  - 0 Staff obtains Chiropractor, Records, Test Results / Process Orders []  - 0 Staff telephones HHA, Nursing Homes / Clarify orders / etc []  - 0 Routine Transfer to another Facility (non-emergent condition) []  - 0 Routine Hospital Admission (non-emergent condition) []  - 0 New Admissions / Manufacturing engineer / Ordering NPWT, Apligraf, etc. []  - 0 Emergency Hospital Admission (emergent condition) X- 1 10 Simple Discharge Coordination Ryan Leonard, Ryan Leonard ([id number]) []  - 0 Complex (extensive) Discharge Coordination PROCESS - Special Needs []  - Pediatric / Minor Patient Management 0 []  - 0 Isolation Patient Management []  - 0 Hearing / Language / Visual special needs []  - 0 Assessment of Community assistance (transportation, D/C planning, etc.) []  - 0 Additional assistance / Altered mentation []  - 0 Support Surface(s) Assessment (bed, cushion, seat, etc.) INTERVENTIONS - Wound  Cleansing / Measurement X - Simple Wound Cleansing - one wound 1 5 []  - 0 Complex Wound Cleansing - multiple wounds X- 1 5 Wound Imaging (photographs - any number of wounds) []  - 0 Wound Tracing (instead of photographs)  X- 1 5 Simple Wound Measurement - one wound []  - 0 Complex Wound Measurement - multiple wounds INTERVENTIONS - Wound Dressings []  - Small Wound Dressing one or multiple wounds 0 []  - 0 Medium Wound Dressing one or multiple wounds []  - 0 Large Wound Dressing one or multiple wounds []  - 0 Application of Medications - topical []  - 0 Application of Medications - injection INTERVENTIONS - Miscellaneous []  - External ear exam 0 []  - 0 Specimen Collection (cultures, biopsies, blood, body fluids, etc.) []  - 0 Specimen(s) / Culture(s) sent or taken to Lab for analysis []  - 0 Patient Transfer (multiple staff / Nurse, adult / Similar devices) []  - 0 Simple Staple / Suture removal (25 or less) []  - 0 Complex Staple / Suture removal (26 or more) []  - 0 Hypo / Hyperglycemic Management (close monitor of Blood Glucose) []  - 0 Ankle / Brachial Index (ABI) - do not check if billed separately X- 1 5 Vital Signs Ryan Leonard, Ryan Leonard ([id number]) Has the patient been seen at the hospital within the last three years: Yes Total Score: 35 Level Of Care: New/Established - Level 1 Electronic Signature(s) Signed: 08/26/2017 4:43:23 PM By: Elliot Gurney, BSN, RN, CWS, Kim RN, BSN Entered By: Elliot Gurney, BSN, RN, CWS, Kim on 08/24/2017 09:44:54 Ryan Leonard ([id number]) -------------------------------------------------------------------------------- Encounter Discharge Information Details Patient Name: Ryan Leonard Date of Service: 08/24/2017 9:15 AM Medical Record Number: [id number] Patient Account Number: [id number] Date of Birth/Sex: [birth year] (41 y.o. M) Treating RN: Huel Coventry Primary Care Khamari Yousuf: Naoma Diener Other Clinician: Referring Rachid Parham: Naoma Diener Treating  Marketia Stallsmith/Extender: Kathreen Cosier in Treatment: 6 Encounter Discharge Information Items Discharge Pain Level: 0 Discharge Condition: Stable Ambulatory Status: Ambulatory Discharge Destination: Home Transportation: Private Auto Accompanied By: self Schedule Follow-up Appointment: Yes Medication Reconciliation completed and Yes provided to Patient/Care Armour Villanueva: Provided on Clinical Summary of Care: 08/24/2017 Form Type Recipient Paper Patient BW Electronic Signature(s) Signed: 08/26/2017 4:43:23 PM By: Elliot Gurney, BSN, RN, CWS, Kim RN, BSN Entered By: Elliot Gurney, BSN, RN, CWS, Kim on 08/24/2017 09:45:23 Ryan Leonard ([id number]) -------------------------------------------------------------------------------- Lower Extremity Assessment Details Patient Name: Ryan Leonard Date of Service: 08/24/2017 9:15 AM Medical Record Number: [id number] Patient Account Number: [id number] Date of Birth/Sex: 11/18/1975 (41 y.o. M) Treating RN: Renne Crigler Primary Care Calloway Andrus: Naoma Diener Other Clinician: Referring Ezariah Nace: Naoma Diener Treating Edgard Debord/Extender: Kathreen Cosier in Treatment: 6 Edema Assessment Assessed: [Left: No] [Right: No] Edema: [Left: N] [Right: o] Vascular Assessment Claudication: Claudication Assessment [Right:None] Pulses: Dorsalis Pedis Palpable: [Right:Yes] Posterior Tibial Extremity colors, hair growth, and conditions: Extremity Color: [Right:Normal] Hair Growth on Extremity: [Right:Yes] Temperature of Extremity: [Right:Warm] Capillary Refill: [Right:< 3 seconds] Toe Nail Assessment Left: Right: Thick: No Discolored: No Deformed: No Improper Length and Hygiene: No Electronic Signature(s) Signed: 08/24/2017 10:16:36 AM By: Renne Crigler Entered By: Renne Crigler on 08/24/2017 09:23:32 Ryan Leonard ([id number]) -------------------------------------------------------------------------------- Multi Wound Chart Details Patient  Name: Ryan Leonard Date of Service: 08/24/2017 9:15 AM Medical Record Number: [id number] Patient Account Number: [id number] Date of Birth/Sex: 20-Jun-1975 (42 y.o. M) Treating RN: Huel Coventry Primary Care Reina Wilton: Naoma Diener Other Clinician: Referring Chastity Noland: Naoma Diener Treating Loralyn Rachel/Extender: Kathreen Cosier in Treatment: 6 Vital Signs Height(in): 72 Pulse(bpm): 79 Weight(lbs): 226.5 Blood Pressure(mmHg): 161/90 Body Mass Index(BMI): 31 Temperature(F): 98.0 Respiratory Rate 20 (breaths/min): Photos: [3:No Photos] [N/A:N/A] Wound Location: [3:Right Lower Leg] [N/A:N/A] Wounding Event: [3:Gradually Appeared] [N/A:N/A] Primary Etiology: [3:Diabetic Wound/Ulcer of the Lower Extremity] [N/A:N/A] Comorbid History: [3:Asthma, Hypertension, Type II Diabetes, Neuropathy] [N/A:N/A] Date Acquired: [3:06/29/2017] [N/A:N/A]  Weeks of Treatment: [3:6] [N/A:N/A] Wound Status: [3:Healed - Epithelialized] [N/A:N/A] Pending Amputation on [3:Yes] [N/A:N/A] Presentation: Measurements L x W x D [3:0x0x0] [N/A:N/A] (cm) Area (cm) : [3:0] [N/A:N/A] Volume (cm) : [3:0] [N/A:N/A] % Reduction in Area: [3:100.00%] [N/A:N/A] % Reduction in Volume: [3:100.00%] [N/A:N/A] Classification: [3:Grade 2] [N/A:N/A] Exudate Amount: [3:None Present] [N/A:N/A] Wound Margin: [3:Distinct, outline attached] [N/A:N/A] Granulation Amount: [3:None Present (0%)] [N/A:N/A] Necrotic Amount: [3:None Present (0%)] [N/A:N/A] Exposed Structures: [3:Fascia: No Fat Layer (Subcutaneous Tissue) Exposed: No Tendon: No Muscle: No Joint: No Bone: No] [N/A:N/A] Epithelialization: [3:Large (67-100%)] [N/A:N/A] Periwound Skin Texture: [3:Excoriation: No Induration: No Callus: No Crepitus: No Rash: No Scarring: No] [N/A:N/A] Periwound Skin Moisture: Maceration: No N/A N/A Dry/Scaly: No Periwound Skin Color: Atrophie Blanche: No N/A N/A Cyanosis: No Ecchymosis: No Erythema: No Hemosiderin Staining:  No Mottled: No Pallor: No Rubor: No Temperature: No Abnormality N/A N/A Tenderness on Palpation: No N/A N/A Wound Preparation: Ulcer Cleansing: N/A N/A Rinsed/Irrigated with Saline Topical Anesthetic Applied: Other: lidocaine 4% Treatment Notes Electronic Signature(s) Signed: 08/24/2017 9:44:07 AM By: Bonnell Publicoulter, Leah Entered By: Bonnell Publicoulter, Leah on 08/24/2017 09:44:06 Ryan Leonard, Ryan Leonard ([id number]) -------------------------------------------------------------------------------- Multi-Disciplinary Care Plan Details Patient Name: Ryan Leonard, Ryan Leonard Date of Service: 08/24/2017 9:15 AM Medical Record Number: [id number] Patient Account Number: [id number] Date of Birth/Sex: [birth year] (42 y.o. M) Treating RN: Huel CoventryWoody, Kim Primary Care Joaopedro Eschbach: Naoma DienerKOMIVES, EUGENIE Other Clinician: Referring Kiarrah Rausch: Naoma DienerKOMIVES, EUGENIE Treating Carletta Feasel/Extender: Kathreen Cosieroulter, Leah Weeks in Treatment: 6 Active Inactive Electronic Signature(s) Signed: 08/25/2017 3:46:37 PM By: Elliot GurneyWoody, BSN, RN, CWS, Kim RN, BSN Entered By: Elliot GurneyWoody, BSN, RN, CWS, Kim on 08/25/2017 15:46:35 Ryan Leonard, Ryan Leonard ([id number]) -------------------------------------------------------------------------------- Pain Assessment Details Patient Name: Ryan Leonard, Ryan Leonard Date of Service: 08/24/2017 9:15 AM Medical Record Number: [id number] Patient Account Number: [id number] Date of Birth/Sex: [birth year] (41 y.o. M) Treating RN: Renne CriglerFlinchum, Cheryl Primary Care Sequoia Witz: Naoma DienerKOMIVES, EUGENIE Other Clinician: Referring Sonyia Muro: Naoma DienerKOMIVES, EUGENIE Treating Murriel Holwerda/Extender: Kathreen Cosieroulter, Leah Weeks in Treatment: 6 Active Problems Location of Pain Severity and Description of Pain Patient Has Paino No Site Locations Pain Management and Medication Current Pain Management: Electronic Signature(s) Signed: 08/24/2017 10:16:36 AM By: Renne CriglerFlinchum, Cheryl Entered By: Renne CriglerFlinchum, Cheryl on 08/24/2017 09:16:19 Ryan Leonard, Ryan Leonard  ([id number]) -------------------------------------------------------------------------------- Patient/Caregiver Education Details Patient Name: Ryan Leonard, Ryan Leonard Date of Service: 08/24/2017 9:15 AM Medical Record Number: [id number] Patient Account Number: [id number] Date of Birth/Gender: [birth year] (42 y.o. M) Treating RN: Huel CoventryWoody, Kim Primary Care Physician: Naoma DienerKOMIVES, EUGENIE Other Clinician: Referring Physician: Naoma DienerKOMIVES, EUGENIE Treating Physician/Extender: Kathreen Cosieroulter, Leah Weeks in Treatment: 6 Education Assessment Education Provided To: Patient Education Topics Provided Elevated Blood Sugar/ Impact on Healing: Handouts: Elevated Blood Sugars: How Do They Affect Wound Healing Methods: Explain/Verbal Responses: State content correctly Wound/Skin Impairment: Handouts: Other: covered for protection for 2 weeks Methods: Explain/Verbal Responses: State content correctly Electronic Signature(s) Signed: 08/26/2017 4:43:23 PM By: Elliot GurneyWoody, BSN, RN, CWS, Kim RN, BSN Entered By: Elliot GurneyWoody, BSN, RN, CWS, Kim on 08/24/2017 09:46:08 Ryan Leonard, Ryan Leonard ([id number]) -------------------------------------------------------------------------------- Wound Assessment Details Patient Name: Ryan Leonard, Jiovanny Date of Service: 08/24/2017 9:15 AM Medical Record Number: [id number] Patient Account Number: [id number] Date of Birth/Sex: [birth year] (41 y.o. M) Treating RN: Huel CoventryWoody, Kim Primary Care Quintez Maselli: Naoma DienerKOMIVES, EUGENIE Other Clinician: Referring Lucas Exline: Naoma DienerKOMIVES, EUGENIE Treating Walda Hertzog/Extender: Kathreen Cosieroulter, Leah Weeks in Treatment: 6 Wound Status Wound Number: 3 Primary Diabetic Wound/Ulcer of the Lower Etiology: Extremity Wound Location: Right Lower Leg Wound Status: Healed - Epithelialized Wounding Event: Gradually Appeared Comorbid Asthma, Hypertension, Type II Diabetes, Date Acquired: 06/29/2017 History: Neuropathy Weeks Of Treatment: 6 Clustered Wound: No Pending Amputation On Presentation Photos Photo  Uploaded By: Renne Crigler on 08/24/2017 10:11:57 Wound Measurements Length: (cm) 0 % Re Width: (cm) 0 % Re Depth: (cm) 0 Epit Area: (cm) 0 Tun Volume: (cm) 0 Und duction in Area: 100% duction in Volume: 100% helialization: Large (67-100%) neling: No ermining: No Wound Description Classification: Grade 2 Wound Margin: Distinct, outline attached Exudate Amount: None Present Foul Odor After Cleansing: No Slough/Fibrino No Wound Bed Granulation Amount: None Present (0%) Exposed Structure Necrotic Amount: None Present (0%) Fascia Exposed: No Fat Layer (Subcutaneous Tissue) Exposed: No Tendon Exposed: No Muscle Exposed: No Joint Exposed: No Bone Exposed: No Periwound Skin Texture Texture Color FINNIGAN, WARRINER ([id number]) No Abnormalities Noted: No No Abnormalities Noted: No Callus: No Atrophie Blanche: No Crepitus: No Cyanosis: No Excoriation: No Ecchymosis: No Induration: No Erythema: No Rash: No Hemosiderin Staining: No Scarring: No Mottled: No Pallor: No Moisture Rubor: No No Abnormalities Noted: No Dry / Scaly: No Temperature / Pain Maceration: No Temperature: No Abnormality Wound Preparation Ulcer Cleansing: Rinsed/Irrigated with Saline Topical Anesthetic Applied: Other: lidocaine 4%, Electronic Signature(s) Signed: 08/24/2017 9:43:47 AM By: Bonnell Public Signed: 08/26/2017 4:43:23 PM By: Elliot Gurney, BSN, RN, CWS, Kim RN, BSN Entered By: Bonnell Public on 08/24/2017 09:43:46 Ryan Leonard ([id number]) -------------------------------------------------------------------------------- Vitals Details Patient Name: Ryan Leonard Date of Service: 08/24/2017 9:15 AM Medical Record Number: [id number] Patient Account Number: [id number] Date of Birth/Sex: 19-Sep-1975 (42 y.o. M) Treating RN: Renne Crigler Primary Care Jet Armbrust: Naoma Diener Other Clinician: Referring Treyden Hakim: Naoma Diener Treating Chasady Longwell/Extender: Kathreen Cosier in  Treatment: 6 Vital Signs Time Taken: 09:16 Temperature (F): 98.0 Height (in): 72 Pulse (bpm): 79 Weight (lbs): 226.5 Respiratory Rate (breaths/min): 20 Body Mass Index (BMI): 30.7 Blood Pressure (mmHg): 161/90 Reference Range: 80 - 120 mg / dl Electronic Signature(s) Signed: 08/24/2017 10:16:36 AM By: Renne Crigler Entered By: Renne Crigler on 08/24/2017 09:16:38

## 2017-08-28 NOTE — Progress Notes (Signed)
[facility], GARLOW ([id number]) Visit Report for 08/24/2017 Chief Complaint Document Details Patient Name: Ryan Leonard, Ryan Leonard Date of Service: 08/24/2017 9:15 AM Medical Record Number: [id number] Patient Account Number: [id number] Date of Birth/Sex: November 05, 1975 (41 y.o. M) Treating RN: Huel Coventry Primary Care Provider: Naoma Diener Other Clinician: Referring Provider: Naoma Diener Treating Provider/Extender: Kathreen Cosier in Treatment: 6 Information Obtained from: Patient Chief Complaint He is here in follow up evaluation of right foot ulcers Electronic Signature(s) Signed: 08/24/2017 9:44:26 AM By: Bonnell Public Entered By: Bonnell Public on 08/24/2017 09:44:26 Ryan Leonard ([id number]) -------------------------------------------------------------------------------- HPI Details Patient Name: Ryan Leonard Date of Service: 08/24/2017 9:15 AM Medical Record Number: [id number] Patient Account Number: [id number] Date of Birth/Sex: March 30, 1976 (42 y.o. M) Treating RN: Huel Coventry Primary Care Provider: Naoma Diener Other Clinician: Referring Provider: Naoma Diener Treating Provider/Extender: Kathreen Cosier in Treatment: 6 History of Present Illness HPI Description: 07/13/17; this is a 42 year old man who is a poorly controlled diabetic with a hemoglobin A1c of over 14. Recently transitioned from oral agents to the addition of long-acting basal insulin. For the last 2 weeks he has had problems with ulcerations on his right foot this includes 2 shallow ulcerations one just distal to the ankle and a smaller one distal to this and a new recent blister just proximal to his toes. Last week his entire foot and lower ankle became intensely swollen and painful he was seen in the emergency room on 07/10/17 at the [facility] clinic in [facility]. Diagnosed with a diabetic foot infection and put on Augmentin. This has helped somewhat with the swelling but he is still having a fair  amount of pain and yesterday found it difficult to walk. He has not been systemically unwell. ABIs in this clinic were 0.88 and on the right 0.7-1 the left. He will definitely need formal arterial studies 07/20/17; my end of the day note on this patient from last week totally neglected to mention wounds over his right second third and fourth PIPs in his right hand which she obtained while trying to punch away ice. We've been using silver alginate to these areas as well. The area on the fourth PIP is healed. Second is much better. Third still covered by a large amount of adherent nonviable tissue oThe 3 wound areas from last week including the distal blister all look a lot better. Erythema and edema in the foot is better. Culture I did was negative x-ray of the foot I did was negative of the right foot and ankle ARTERIAL STUDIES showed an ABI on the right of 0.73 on the left hip 0.92. However TBIs were normal at 0.75 on the right and 0.73 on the left. Waveforms were triphasic bilaterally at the posterior and anterior tibial. The wounds will have enough blood flow for healing but I thought this should come to the attention of vascular surgeon especially with the reduced ABI on the right vis-o-vis his uncontrolled diabetes. 07/27/17; the area on the second fourth PIP is closed. Third still open but much better. All the wounds on his right foot and ankle look considerably better. Using silver alginate all wound areas. He saw vascular surgery and has an angiogram booked for tomorrow with Dr. Wyn Quaker. He sees dermatology next week 08/03/17; ANGIOGRAPHY; the patient had angiography on 07/28/17 performed by Dr. dew. He had percutaneous transluminal angioplasty of the right anterior tibial artery. Percutaneous transluminal angioplasty of the right superficial femoral artery with stent placement in the proximal superficial femoral artery. He is  still having some pain in his right thigh on the medial aspect. He has  not noted any problems in the catheter site in the left femoral. He has 3 wounds 2 on the or so foot, dorsal ankle and on the anterior tibial area. All of these are measuring smaller 08/10/17; patient arrives with the areas on his hands totally healed. The area on his dorsal foot and dorsal ankle are also healed. He still has one open area on the dorsal right calf just above the ankle which is still open. We've been using silver alginate. 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 08/17/17; the patient is been to see dermatology with regards to the pigmented macules and papules on his bilateral anterior shins and arms. I don't see a specific diagnosis but they did do a biopsy. 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 The remaining wound just above the lateral right ankle. Dimensions are considerably smaller but is still not closed. We've been using silver alginate. 08/24/17-he is here in follow-up evaluation for right foot ulcer. He is healed and will be discharged from the wound care center today Electronic Signature(s) Signed: 08/24/2017 9:45:09 AM By: Bonnell Publicoulter, Jabarie Pop Entered By: Bonnell Publicoulter, Edgard Debord on 08/24/2017 09:45:08 Ryan ChurchWILLIAMS, Ruby ([id number]) -------------------------------------------------------------------------------- Physician Orders Details Patient Name: Ryan Leonard Date of Service: 08/24/2017 9:15 AM Medical Record Number: [id number] Patient Account Number: [id number] Date of Birth/Sex: Aug 18, 1975 (42 y.o. M) Treating RN: Huel CoventryWoody, Kim Primary Care Provider: Naoma DienerKOMIVES, EUGENIE Other Clinician: Referring Provider: Naoma DienerKOMIVES, EUGENIE Treating Provider/Extender: Kathreen Cosieroulter, Mekesha Solomon Weeks in Treatment: 6 Verbal / Phone Orders: No Diagnosis Coding [facility] #[facility] Right Lower Leg o Discharge from Wound Care Center - Keep covered for protection. Electronic Signature(s) Signed:  08/24/2017 5:31:19 PM By: Bonnell Publicoulter, Cleotha Whalin Signed: 08/26/2017 4:43:23 PM By: Elliot GurneyWoody, BSN, RN, CWS, Kim RN, BSN Entered By: Elliot GurneyWoody, BSN, RN, CWS, Kim on 08/24/2017 09:41:43 Ryan ChurchWILLIAMS, Mose ([id number]) -------------------------------------------------------------------------------- Problem List Details Patient Name: Ryan ChurchWILLIAMS, Kurt Date of Service: 08/24/2017 9:15 AM Medical Record Number: [id number] Patient Account Number: [id number] Date of Birth/Sex: Aug 18, 1975 (42 y.o. 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 E11.51 Type 2 diabetes mellitus with diabetic peripheral angiopathy 07/13/2017 Yes without gangrene E11.42 Type 2 diabetes mellitus with diabetic polyneuropathy 07/13/2017 Yes S61.411D Laceration without foreign body of right hand, subsequent 07/20/2017 Yes encounter Inactive Problems Resolved Problems Electronic Signature(s) Signed: 08/24/2017 9:44:01 AM By: Bonnell Publicoulter, Majesty Stehlin Previous Signature: 08/24/2017 9:42:38 AM Version By: Bonnell Publicoulter, Darilyn Storbeck Entered By: Bonnell Publicoulter, Linde Wilensky on 08/24/2017 09:44:01 Ryan ChurchWILLIAMS, Javoris ([id number]) -------------------------------------------------------------------------------- Progress Note Details Patient Name: Ryan ChurchWILLIAMS, Gumecindo Date of Service: 08/24/2017 9:15 AM Medical Record Number: [id number] Patient Account Number: [id number] Date of Birth/Sex: Aug 18, 1975 (42 y.o. M) Treating RN: Huel CoventryWoody, Kim Primary Care Provider: Naoma DienerKOMIVES, EUGENIE Other Clinician: Referring Provider: Naoma DienerKOMIVES, EUGENIE Treating Provider/Extender:  Kathreen Cosieroulter, Marvis Bakken Weeks in Treatment: 6 Subjective Chief Complaint Information obtained from Patient He is here in follow up evaluation of right foot ulcers History of Present Illness (HPI) 07/13/17; this is a 42 year old man who is a poorly controlled diabetic with a hemoglobin A1c of over  14. Recently transitioned from oral agents to the addition of long-acting basal insulin. For the last 2 weeks he has had problems with ulcerations on his right foot this includes 2 shallow ulcerations one just distal to the ankle and a smaller one distal to this and a new recent blister just proximal to his toes. Last week his entire foot and lower ankle became intensely swollen and painful he was seen in the emergency room on 07/10/17 at the [facility] clinic in [facility]. Diagnosed with a diabetic foot infection and put on Augmentin. This has helped somewhat with the swelling but he is still having a fair amount of pain and yesterday found it difficult to walk. He has not been systemically unwell. ABIs in this clinic were 0.88 and on the right 0.7-1 the left. He will definitely need formal arterial studies 07/20/17; my end of the day note on this patient from last week totally neglected to mention wounds over his right second third and fourth PIPs in his right hand which she obtained while trying to punch away ice. We've been using silver alginate to these areas as well. The area on the fourth PIP is healed. Second is much better. Third still covered by a large amount of adherent nonviable tissue The 3 wound areas from last week including the distal blister all look a lot better. Erythema and edema in the foot is better. Culture I did was negative x-ray of the foot I did was negative of the right foot and ankle ARTERIAL STUDIES showed an ABI on the right of 0.73 on the left hip 0.92. However TBIs were normal at 0.75 on the right and 0.73 on the left. Waveforms were triphasic bilaterally at the posterior  and anterior tibial. The wounds will have enough blood flow for healing but I thought this should come to the attention of vascular surgeon especially with the reduced ABI on the right vis--vis his uncontrolled diabetes. 07/27/17; the area on the second fourth PIP is closed. Third still open but much better. All the wounds on his right foot and ankle look considerably better. Using silver alginate all wound areas. He saw vascular surgery and has an angiogram booked for tomorrow with Dr. Wyn Quaker. He sees dermatology next week 08/03/17; ANGIOGRAPHY; the patient had angiography on 07/28/17 performed by Dr. dew. He had percutaneous transluminal angioplasty of the right anterior tibial artery. Percutaneous transluminal angioplasty of the right superficial femoral artery with stent placement in the proximal superficial femoral artery. He is still having some pain in his right thigh on the medial aspect. He has not noted any problems in the catheter site in the left femoral. He has 3 wounds 2 on the or so foot, dorsal ankle and on the anterior tibial area. All of these are measuring smaller 08/10/17; patient arrives with the areas on his hands totally healed. The area on his dorsal foot and dorsal ankle are also healed. He still has one open area on the dorsal right calf just above the ankle which is still open. We've been using silver alginate. 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 08/17/17; the patient is been to see dermatology with regards to the pigmented macules and papules on his bilateral anterior shins and arms. I don't see a specific diagnosis but they did do a biopsy. 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 The remaining wound just above the lateral right ankle. Dimensions  are considerably smaller but is still not closed. We've been using silver alginate. 08/24/17-he is here in follow-up evaluation for  right foot ulcer. He is healed and will be discharged from the wound care center today KRISTOPHER, ATTWOOD ([id number]) Patient History Information obtained from Patient. Family History Cancer - Maternal Grandparents, Diabetes - Maternal Grandparents, Hypertension - Father, No family history of Heart Disease, Hereditary Spherocytosis, Kidney Disease, Lung Disease, Seizures, Stroke, Thyroid Problems, Tuberculosis. Social History Never smoker, Marital Status - Married, Alcohol Use - Never, Drug Use - No History, Caffeine Use - Daily. Objective Constitutional Vitals Time Taken: 9:16 AM, Height: 72 in, Weight: 226.5 lbs, BMI: 30.7, Temperature: 98.0 F, Pulse: 79 bpm, Respiratory Rate: 20 breaths/min, Blood Pressure: 161/90 mmHg. Integumentary (Hair, Skin) Wound #3 status is Healed - Epithelialized. Original cause of wound was Gradually Appeared. The wound is located on the Right Lower Leg. The wound measures 0cm length x 0cm width x 0cm depth; 0cm^2 area and 0cm^3 volume. There is no tunneling or undermining noted. There is a none present amount of drainage noted. The wound margin is distinct with the outline attached to the wound base. There is no granulation within the wound bed. There is no necrotic tissue within the wound bed. The periwound skin appearance did not exhibit: Callus, Crepitus, Excoriation, Induration, Rash, Scarring, Dry/Scaly, Maceration, Atrophie Blanche, Cyanosis, Ecchymosis, Hemosiderin Staining, Mottled, Pallor, Rubor, Erythema. Periwound temperature was noted as No Abnormality. 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 discharge-healed Protect the newly healed areas for 1-2 weeks with padded/foam border dressing Electronic Signature(s) Signed: 08/24/2017 9:45:54 AM By: Bonnell Public Entered By: Bonnell Public on 08/24/2017 09:45:54 Ryan Leonard ([id number]) -------------------------------------------------------------------------------- ROS/PFSH Details Patient Name: Ryan Leonard Date of Service: 08/24/2017 9:15 AM Medical Record Number: [id number] Patient Account Number: [id number] Date of Birth/Sex: 1975/08/05 (42 y.o. M) Treating RN: Huel Coventry Primary Care Provider: Naoma Diener Other Clinician: Referring Provider: Naoma Diener Treating Provider/Extender: Kathreen Cosier in Treatment: 6 Information Obtained From Patient Wound History Do you currently have one or more open woundso Yes How many open wounds do you currently haveo 5 Approximately how long have you had your woundso 3 weeks How have you been treating your wound(s) until nowo abt ointment Has your wound(s) ever healed and then re-openedo No Have you had any lab work done in the past montho No Have you tested positive for an antibiotic resistant organism (MRSA, VRE)o No Have you tested positive for osteomyelitis (bone infection)o No Have you had any tests for circulation on your legso No Have you had other problems associated with your woundso Infection, Swelling Respiratory Medical History: Positive for: Asthma Cardiovascular Medical History: Positive for: Hypertension Endocrine Medical History: Positive for: Type II Diabetes Time with diabetes: 11 yrs Treated with: Insulin Blood sugar tested every day: Yes Tested : 2x day Neurologic Medical History: Positive for:  Neuropathy Immunizations Pneumococcal Vaccine: Received Pneumococcal Vaccination: No Implantable Devices Family and Social History Cancer: Yes - Maternal Grandparents; Diabetes: Yes - Maternal Grandparents; Heart  Disease: No; Hereditary Spherocytosis: No; Hypertension: Yes - Father; Kidney Disease: No; Lung Disease: No; Seizures: No; Stroke: No; Thyroid Problems: No; Tuberculosis: No; Never smoker; Marital Status - Married; Alcohol Use: Never; Drug Use: No History; Caffeine [place], Arlys John ([id number]) Use: Daily; Financial Concerns: No; Food, Clothing or Shelter Needs: No; Support System Lacking: No; Transportation Concerns: No; Advanced Directives: No; Patient does not want information on Advanced Directives; Do not resuscitate: No; Living Will: Yes (Not Provided); Medical Power of Attorney: No Physician Affirmation I have reviewed and agree with the above information. Electronic Signature(s) Signed: 08/24/2017 5:31:19 PM By: Bonnell Public Signed: 08/26/2017 4:43:23 PM By: Elliot Gurney, BSN, RN, CWS, Kim RN, BSN Entered By: Bonnell Public on 08/24/2017 09:45:19 BRAYSON, LIVESEY ([id number]) -------------------------------------------------------------------------------- SuperBill Details Patient Name: Ryan Leonard Date of Service: 08/24/2017 Medical Record Number: [id number] Patient Account Number: [id number] Date of Birth/Sex: 25-Aug-1975 (42 y.o. M) Treating RN: Huel Coventry Primary Care Provider: Naoma Diener Other Clinician: Referring Provider: Naoma Diener Treating Provider/Extender: Kathreen Cosier in Treatment: 6 Diagnosis Coding ICD-10 Codes Code Description E11.621 Type 2 diabetes mellitus with foot ulcer L03.115 Cellulitis of right lower limb L97.511 Non-pressure chronic ulcer of other part of right foot limited to breakdown of skin E11.51 Type 2 diabetes mellitus with diabetic peripheral angiopathy without gangrene E11.42 Type 2 diabetes mellitus with diabetic  polyneuropathy S61.411D Laceration without foreign body of right hand, subsequent encounter Facility Procedures CPT4 Code: [id number] Description: [phone] - WOUND CARE VISIT-LEV 1 EST PT Modifier: Quantity: 1 Physician Procedures CPT4 Code Description: [id number] - WC PHYS LEVEL 2 - EST PT ICD-10 Diagnosis Description E11.621 Type 2 diabetes mellitus with foot ulcer L97.511 Non-pressure chronic ulcer of other part of right foot limited Modifier: to breakdown of Quantity: 1 skin Electronic Signature(s) Signed: 08/24/2017 9:46:08 AM By: Bonnell Public Entered By: Bonnell Public on 08/24/2017 09:46:07

## 2018-02-28 ENCOUNTER — Ambulatory Visit (INDEPENDENT_AMBULATORY_CARE_PROVIDER_SITE_OTHER): Payer: Managed Care, Other (non HMO) | Admitting: Vascular Surgery

## 2018-02-28 ENCOUNTER — Ambulatory Visit (INDEPENDENT_AMBULATORY_CARE_PROVIDER_SITE_OTHER): Payer: Managed Care, Other (non HMO)

## 2018-02-28 ENCOUNTER — Encounter (INDEPENDENT_AMBULATORY_CARE_PROVIDER_SITE_OTHER): Payer: Self-pay | Admitting: Vascular Surgery

## 2018-02-28 VITALS — BP 178/95 | HR 84 | Resp 16 | Ht 72.0 in | Wt 240.8 lb

## 2018-02-28 DIAGNOSIS — M7989 Other specified soft tissue disorders: Secondary | ICD-10-CM | POA: Insufficient documentation

## 2018-02-28 DIAGNOSIS — I7025 Atherosclerosis of native arteries of other extremities with ulceration: Secondary | ICD-10-CM | POA: Diagnosis not present

## 2018-02-28 DIAGNOSIS — I739 Peripheral vascular disease, unspecified: Secondary | ICD-10-CM

## 2018-02-28 DIAGNOSIS — I10 Essential (primary) hypertension: Secondary | ICD-10-CM | POA: Diagnosis not present

## 2018-02-28 DIAGNOSIS — E1159 Type 2 diabetes mellitus with other circulatory complications: Secondary | ICD-10-CM

## 2018-02-28 DIAGNOSIS — E785 Hyperlipidemia, unspecified: Secondary | ICD-10-CM | POA: Diagnosis not present

## 2018-02-28 NOTE — Patient Instructions (Signed)
Edema Edema is when you have too much fluid in your body or under your skin. Edema may make your legs, feet, and ankles swell up. Swelling is also common in looser tissues, like around your eyes. This is a common condition. It gets more common as you get older. There are many possible causes of edema. Eating too much salt (sodium) and being on your feet or sitting for a long time can cause edema in your legs, feet, and ankles. Hot weather may make edema worse. Edema is usually painless. Your skin may look swollen or shiny. Follow these instructions at home:  Keep the swollen body part raised (elevated) above the level of your heart when you are sitting or lying down.  Do not sit still or stand for a long time.  Do not wear tight clothes. Do not wear garters on your upper legs.  Exercise your legs. This can help the swelling go down.  Wear elastic bandages or support stockings as told by your doctor.  Eat a low-salt (low-sodium) diet to reduce fluid as told by your doctor.  Depending on the cause of your swelling, you may need to limit how much fluid you drink (fluid restriction).  Take over-the-counter and prescription medicines only as told by your doctor. Contact a doctor if:  Treatment is not working.  You have heart, liver, or kidney disease and have symptoms of edema.  You have sudden and unexplained weight gain. Get help right away if:  You have shortness of breath or chest pain.  You cannot breathe when you lie down.  You have pain, redness, or warmth in the swollen areas.  You have heart, liver, or kidney disease and get edema all of a sudden.  You have a fever and your symptoms get worse all of a sudden. Summary  Edema is when you have too much fluid in your body or under your skin.  Edema may make your legs, feet, and ankles swell up. Swelling is also common in looser tissues, like around your eyes.  Raise (elevate) the swollen body part above the level of your  heart when you are sitting or lying down.  Follow your doctor's instructions about diet and how much fluid you can drink (fluid restriction). This information is not intended to replace advice given to you by your health care provider. Make sure you discuss any questions you have with your health care provider. Document Released: 11/10/2007 Document Revised: 06/11/2016 Document Reviewed: 06/11/2016 Elsevier Interactive Patient Education  2017 [employer] Inc[employer]  

## 2018-02-28 NOTE — Assessment & Plan Note (Signed)
blood glucose control important in reducing the progression of atherosclerotic disease. Also, involved in wound healing. On appropriate medications.  

## 2018-02-28 NOTE — Assessment & Plan Note (Signed)

## 2018-02-28 NOTE — Assessment & Plan Note (Signed)
lipid control important in reducing the progression of atherosclerotic disease. Continue statin therapy  

## 2018-02-28 NOTE — Assessment & Plan Note (Signed)
blood pressure control important in reducing the progression of atherosclerotic disease. On appropriate oral medications.  

## 2018-02-28 NOTE — Assessment & Plan Note (Signed)
His noninvasive studies today show normal triphasic waveforms and normal ABIs throughout both lower extremities with no right lower extremity stenosis identified on duplex either. Perfusion is currently good.  Recheck ABIs in 6 months.  Continue aspirin and Lipitor.

## 2018-02-28 NOTE — Progress Notes (Signed)
MRN : [id number]  Ryan Leonard is a 42 y.o. (Apr 26, 1976) male who presents with chief complaint of  Chief Complaint  Patient presents with  . Follow-up    67month abi,right le arterial ultrasound  .  History of Present Illness: Patient returns today in follow up of PAD.  About 7 months ago he underwent fairly extensive right lower extremity revascularization for ulceration.  The ulcers have healed.  His noninvasive studies today show normal triphasic waveforms and normal ABIs throughout both lower extremities with no right lower extremity stenosis identified on duplex either.  He is having more trouble with his legs predominantly from swelling.  His right leg swelling a lot, and the left leg is also swelling some.  No chest pain or shortness of breath.  No ulceration or infection  Current Outpatient Medications  Medication Sig Dispense Refill  . amLODipine (NORVASC) 5 MG tablet Take 5 mg by mouth daily.    Marland Kitchen amoxicillin (AMOXIL) 500 MG capsule TAKE ONE CAPSULE BY MOUTH 3 TIMES A DAY UNTIL GONE  0  . aspirin EC 81 MG tablet Take 1 tablet (81 mg total) by mouth daily. 150 tablet 2  . Cholecalciferol (VITAMIN D) 2000 units tablet Take by mouth.    Marland Kitchen glipiZIDE (GLUCOTROL) 10 MG tablet TAKE 1 TABLET (10 MG TOTAL) BY MOUTH 2 (TWO) TIMES DAILY BEFORE MEALS.  3  . hydrochlorothiazide (HYDRODIURIL) 12.5 MG tablet hydrochlorothiazide 25 mg tablet    . lisinopril (PRINIVIL,ZESTRIL) 10 MG tablet Take 10 mg by mouth.    . metFORMIN (GLUCOPHAGE) 500 MG tablet Take 500 mg by mouth 2 (two) times daily with a meal.     . atorvastatin (LIPITOR) 20 MG tablet TAKE 1 TABLET (20 MG TOTAL) BY MOUTH ONCE DAILY. qhs  11  . canagliflozin (INVOKANA) 100 MG TABS tablet Invokana 100 mg tablet    . clopidogrel (PLAVIX) 75 MG tablet Take 1 tablet (75 mg total) by mouth daily. (Patient not taking: Reported on 02/28/2018) 30 tablet 11  . doxycycline (MONODOX) 100 MG capsule doxycycline monohydrate 100 mg capsule    .  FLUoxetine (PROZAC) 10 MG tablet Take 10 mg by mouth daily.  3  . Insulin Glargine (BASAGLAR KWIKPEN) 100 UNIT/ML SOPN Inject into the skin.    . Vitamin D, Ergocalciferol, (DRISDOL) 50000 units CAPS capsule Take 50,000 Units by mouth every 7 (seven) days.     No current facility-administered medications for this visit.     Past Medical History:  Diagnosis Date  . Depression   . Diabetes mellitus without complication (HCC)   . Hyperlipidemia   . Hypertension   . Peripheral vascular disease [facility])     Past Surgical History:  Procedure Laterality Date  . EYE SURGERY    . LOWER EXTREMITY ANGIOGRAPHY Right 07/28/2017   Procedure: LOWER EXTREMITY ANGIOGRAPHY;  Surgeon: Annice Needy, MD;  Location: [facility] INVASIVE CV LAB;  Service: Cardiovascular;  Laterality: Right;  . WISDOM TOOTH EXTRACTION      Social History Social History   Tobacco Use  . Smoking status: Never Smoker  . Smokeless tobacco: Never Used  Substance Use Topics  . Alcohol use: No    Frequency: Never  . Drug use: No    Family History Family History  Problem Relation Age of Onset  . Diabetes Maternal Grandmother   . Colon cancer Maternal Grandfather     Allergies  Allergen Reactions  . Codeine Nausea Only     REVIEW OF SYSTEMS (Negative  unless checked)  Constitutional: [] Weight loss  [] Fever  [] Chills Cardiac: [] Chest pain   [] Chest pressure   [] Palpitations   [] Shortness of breath when laying flat   [] Shortness of breath at rest   [] Shortness of breath with exertion. Vascular:  [] Pain in legs with walking   [] Pain in legs at rest   [] Pain in legs when laying flat   [] Claudication   [] Pain in feet when walking  [] Pain in feet at rest  [] Pain in feet when laying flat   [] History of DVT   [] Phlebitis   [x] Swelling in legs   [] Varicose veins   [x] Non-healing ulcers Pulmonary:   [] Uses home oxygen   [] Productive cough   [] Hemoptysis   [] Wheeze  [] COPD   [] Asthma Neurologic:  [] Dizziness  [] Blackouts   [] Seizures    [] History of stroke   [] History of TIA  [] Aphasia   [] Temporary blindness   [] Dysphagia   [] Weakness or numbness in arms   [] Weakness or numbness in legs Musculoskeletal:  [] Arthritis   [] Joint swelling   [] Joint pain   [] Low back pain Hematologic:  [] Easy bruising  [] Easy bleeding   [] Hypercoagulable state   [] Anemic   Gastrointestinal:  [] Blood in stool   [] Vomiting blood  [x] Gastroesophageal reflux/heartburn   [] Abdominal pain Genitourinary:  [] Chronic kidney disease   [] Difficult urination  [] Frequent urination  [] Burning with urination   [] Hematuria Skin:  [] Rashes   [] Ulcers   [] Wounds Psychological:  [] History of anxiety   []  History of major depression.  Physical Examination  BP (!) 178/95 (BP Location: Right Arm)   Pulse 84   Resp 16   Ht 6' (1.829 m)   Wt 240 lb 12.8 oz (109.2 kg)   BMI 32.66 kg/m  Gen:  WD/WN, NAD Head: [place]/AT, No temporalis wasting. Ear/Nose/Throat: Hearing grossly intact, nares w/o erythema or drainage Eyes: Conjunctiva clear. Sclera non-icteric Neck: Supple.  Trachea midline Pulmonary:  Good air movement, no use of accessory muscles.  Cardiac: RRR, no JVD Vascular:  Vessel Right Left  Radial Palpable Palpable                          PT  1+ palpable  1+ palpable  DP  1+ palpable Palpable    Musculoskeletal: M/S 5/5 throughout.  No deformity or atrophy.  2+ right lower extremity edema, 1+ left lower extremity edema. Neurologic: Sensation grossly intact in extremities.  Symmetrical.  Speech is fluent.  Psychiatric: Judgment intact, Mood & affect appropriate for pt's clinical situation. Dermatologic: No rashes or ulcers noted.  No cellulitis or open wounds.       Labs No results found for this or any previous visit (from the past 2160 hour(s)).  Radiology No results found.  Assessment/Plan  Hyperlipidemia lipid control important in reducing the progression of atherosclerotic disease. Continue statin therapy   Essential  hypertension blood pressure control important in reducing the progression of atherosclerotic disease. On appropriate oral medications.   Type 2 diabetes mellitus with circulatory disorder (HCC) blood glucose control important in reducing the progression of atherosclerotic disease. Also, involved in wound healing. On appropriate medications.   PAD (peripheral artery disease) (HCC) His noninvasive studies today show normal triphasic waveforms and normal ABIs throughout both lower extremities with no right lower extremity stenosis identified on duplex either. Perfusion is currently good.  Recheck ABIs in 6 months.  Continue aspirin and Lipitor.  Swelling of limb I have had a long discussion with the patient  regarding swelling and why it  causes symptoms.  Patient will begin wearing graduated compression stockings class 1 (20-30 mmHg) on a daily basis a prescription was given. The patient will  beginning wearing the stockings first thing in the morning and removing them in the evening. The patient is instructed specifically not to sleep in the stockings.   In addition, behavioral modification will be initiated.  This will include frequent elevation, use of over the counter pain medications and exercise such as walking.  I have reviewed systemic causes for chronic edema such as liver, kidney and cardiac etiologies.  The patient denies problems with these organ systems.    Consideration for a lymph pump will also be made based upon the effectiveness of conservative therapy.  This would help to improve the edema control and prevent sequela such as ulcers and infections   Patient should undergo duplex ultrasound of the venous system to ensure that DVT or reflux is not present.  The patient will follow-up with me after the ultrasound.      Festus BarrenJason Vaneza Pickart, MD  02/28/2018 10:57 AM    This note was created with Dragon medical transcription system.  Any errors from dictation are purely  unintentional

## 2018-04-20 ENCOUNTER — Ambulatory Visit: Payer: Managed Care, Other (non HMO) | Admitting: Physician Assistant

## 2018-06-05 ENCOUNTER — Encounter: Payer: Self-pay | Admitting: Emergency Medicine

## 2018-06-05 ENCOUNTER — Other Ambulatory Visit: Payer: Self-pay

## 2018-06-05 ENCOUNTER — Emergency Department: Payer: Managed Care, Other (non HMO)

## 2018-06-05 ENCOUNTER — Inpatient Hospital Stay
Admission: EM | Admit: 2018-06-05 | Discharge: 2018-06-09 | DRG: 304 | Disposition: A | Discharge status: Home / Self Care | Payer: Managed Care, Other (non HMO) | Attending: Internal Medicine | Admitting: Internal Medicine

## 2018-06-05 ENCOUNTER — Ambulatory Visit
Admission: EM | Admit: 2018-06-05 | Discharge: 2018-06-05 | Disposition: A | Discharge status: Other Acute Inpatient Hospital | Payer: Managed Care, Other (non HMO) | Source: Home / Self Care | Attending: Family Medicine | Admitting: Family Medicine

## 2018-06-05 ENCOUNTER — Inpatient Hospital Stay: Payer: Managed Care, Other (non HMO)

## 2018-06-05 DIAGNOSIS — R0600 Dyspnea, unspecified: Secondary | ICD-10-CM | POA: Diagnosis not present

## 2018-06-05 DIAGNOSIS — E876 Hypokalemia: Secondary | ICD-10-CM | POA: Diagnosis present

## 2018-06-05 DIAGNOSIS — Z833 Family history of diabetes mellitus: Secondary | ICD-10-CM | POA: Diagnosis not present

## 2018-06-05 DIAGNOSIS — E1122 Type 2 diabetes mellitus with diabetic chronic kidney disease: Secondary | ICD-10-CM | POA: Diagnosis present

## 2018-06-05 DIAGNOSIS — R0902 Hypoxemia: Secondary | ICD-10-CM | POA: Diagnosis present

## 2018-06-05 DIAGNOSIS — E11622 Type 2 diabetes mellitus with other skin ulcer: Secondary | ICD-10-CM | POA: Diagnosis present

## 2018-06-05 DIAGNOSIS — R2241 Localized swelling, mass and lump, right lower limb: Secondary | ICD-10-CM

## 2018-06-05 DIAGNOSIS — E1121 Type 2 diabetes mellitus with diabetic nephropathy: Secondary | ICD-10-CM | POA: Diagnosis present

## 2018-06-05 DIAGNOSIS — I129 Hypertensive chronic kidney disease with stage 1 through stage 4 chronic kidney disease, or unspecified chronic kidney disease: Secondary | ICD-10-CM | POA: Diagnosis present

## 2018-06-05 DIAGNOSIS — E1151 Type 2 diabetes mellitus with diabetic peripheral angiopathy without gangrene: Secondary | ICD-10-CM | POA: Diagnosis present

## 2018-06-05 DIAGNOSIS — N179 Acute kidney failure, unspecified: Secondary | ICD-10-CM | POA: Diagnosis present

## 2018-06-05 DIAGNOSIS — I160 Hypertensive urgency: Secondary | ICD-10-CM | POA: Diagnosis present

## 2018-06-05 DIAGNOSIS — L97519 Non-pressure chronic ulcer of other part of right foot with unspecified severity: Secondary | ICD-10-CM | POA: Diagnosis present

## 2018-06-05 DIAGNOSIS — E669 Obesity, unspecified: Secondary | ICD-10-CM | POA: Diagnosis present

## 2018-06-05 DIAGNOSIS — Z7982 Long term (current) use of aspirin: Secondary | ICD-10-CM | POA: Diagnosis not present

## 2018-06-05 DIAGNOSIS — G9341 Metabolic encephalopathy: Secondary | ICD-10-CM | POA: Diagnosis present

## 2018-06-05 DIAGNOSIS — N182 Chronic kidney disease, stage 2 (mild): Secondary | ICD-10-CM | POA: Diagnosis present

## 2018-06-05 DIAGNOSIS — D638 Anemia in other chronic diseases classified elsewhere: Secondary | ICD-10-CM | POA: Diagnosis present

## 2018-06-05 DIAGNOSIS — I161 Hypertensive emergency: Secondary | ICD-10-CM | POA: Insufficient documentation

## 2018-06-05 DIAGNOSIS — I10 Essential (primary) hypertension: Secondary | ICD-10-CM | POA: Diagnosis present

## 2018-06-05 DIAGNOSIS — Z7984 Long term (current) use of oral hypoglycemic drugs: Secondary | ICD-10-CM

## 2018-06-05 DIAGNOSIS — E11621 Type 2 diabetes mellitus with foot ulcer: Secondary | ICD-10-CM | POA: Diagnosis present

## 2018-06-05 DIAGNOSIS — Z23 Encounter for immunization: Secondary | ICD-10-CM

## 2018-06-05 DIAGNOSIS — I248 Other forms of acute ischemic heart disease: Secondary | ICD-10-CM | POA: Diagnosis present

## 2018-06-05 DIAGNOSIS — R609 Edema, unspecified: Secondary | ICD-10-CM

## 2018-06-05 DIAGNOSIS — R2242 Localized swelling, mass and lump, left lower limb: Secondary | ICD-10-CM

## 2018-06-05 DIAGNOSIS — Z79899 Other long term (current) drug therapy: Secondary | ICD-10-CM

## 2018-06-05 DIAGNOSIS — L97919 Non-pressure chronic ulcer of unspecified part of right lower leg with unspecified severity: Secondary | ICD-10-CM | POA: Diagnosis present

## 2018-06-05 DIAGNOSIS — Z6833 Body mass index (BMI) 33.0-33.9, adult: Secondary | ICD-10-CM

## 2018-06-05 DIAGNOSIS — E782 Mixed hyperlipidemia: Secondary | ICD-10-CM | POA: Diagnosis present

## 2018-06-05 DIAGNOSIS — R4182 Altered mental status, unspecified: Secondary | ICD-10-CM | POA: Diagnosis not present

## 2018-06-05 DIAGNOSIS — E119 Type 2 diabetes mellitus without complications: Secondary | ICD-10-CM

## 2018-06-05 LAB — CBC
HCT: 34.2 % — ABNORMAL LOW (ref 39.0–52.0)
HCT: 32.3 % — ABNORMAL LOW (ref 39.0–52.0)
Hemoglobin: 10.9 g/dL — ABNORMAL LOW (ref 13.0–17.0)
Hemoglobin: 10.3 g/dL — ABNORMAL LOW (ref 13.0–17.0)
MCH: 26.7 pg (ref 26.0–34.0)
MCH: 26.1 pg (ref 26.0–34.0)
MCHC: 31.9 g/dL (ref 30.0–36.0)
MCHC: 31.9 g/dL (ref 30.0–36.0)
MCV: 83.6 fL (ref 80.0–100.0)
MCV: 81.8 fL (ref 80.0–100.0)
Platelets: 322 10*3/uL (ref 150–400)
Platelets: 326 10*3/uL (ref 150–400)
RBC: 4.09 MIL/uL — ABNORMAL LOW (ref 4.22–5.81)
RBC: 3.95 MIL/uL — ABNORMAL LOW (ref 4.22–5.81)
RDW: 14.2 % (ref 11.5–15.5)
RDW: 14.2 % (ref 11.5–15.5)
WBC: 9.4 10*3/uL (ref 4.0–10.5)
WBC: 10.5 10*3/uL (ref 4.0–10.5)
nRBC: 0 % (ref 0.0–0.2)
nRBC: 0 % (ref 0.0–0.2)

## 2018-06-05 LAB — TSH: TSH: 4.026 u[IU]/mL (ref 0.350–4.500)

## 2018-06-05 LAB — COMPREHENSIVE METABOLIC PANEL
ALT: 18 U/L (ref 0–44)
AST: 19 U/L (ref 15–41)
Albumin: 3 g/dL — ABNORMAL LOW (ref 3.5–5.0)
Alkaline Phosphatase: 91 U/L (ref 38–126)
Anion gap: 6 (ref 5–15)
BILIRUBIN TOTAL: 0.7 mg/dL (ref 0.3–1.2)
BUN: 18 mg/dL (ref 6–20)
CO2: 26 mmol/L (ref 22–32)
CREATININE: 1.62 mg/dL — AB (ref 0.61–1.24)
Calcium: 8.8 mg/dL — ABNORMAL LOW (ref 8.9–10.3)
Chloride: 106 mmol/L (ref 98–111)
GFR calc Af Amer: 59 mL/min — ABNORMAL LOW (ref >60)
GFR, EST NON AFRICAN AMERICAN: 51 mL/min — AB (ref >60)
Glucose, Bld: 215 mg/dL — ABNORMAL HIGH (ref 70–99)
Potassium: 4 mmol/L (ref 3.5–5.1)
Sodium: 138 mmol/L (ref 135–145)
Total Protein: 6.9 g/dL (ref 6.5–8.1)

## 2018-06-05 LAB — CREATININE, SERUM
Creatinine, Ser: 1.66 mg/dL — ABNORMAL HIGH (ref 0.61–1.24)
GFR calc Af Amer: 57 mL/min — ABNORMAL LOW (ref >60)
GFR calc non Af Amer: 49 mL/min — ABNORMAL LOW (ref >60)

## 2018-06-05 LAB — TROPONIN I
Troponin I: 0.03 ng/mL (ref <0.03)
Troponin I: 0.04 ng/mL (ref <0.03)

## 2018-06-05 LAB — GLUCOSE, CAPILLARY
GLUCOSE-CAPILLARY: 183 mg/dL — AB (ref 70–99)
Glucose-Capillary: 231 mg/dL — ABNORMAL HIGH (ref 70–99)

## 2018-06-05 LAB — HEMOGLOBIN A1C
Hgb A1c MFr Bld: 8.1 % — ABNORMAL HIGH (ref 4.8–5.6)
Mean Plasma Glucose: 185.77 mg/dL

## 2018-06-05 LAB — BRAIN NATRIURETIC PEPTIDE: B Natriuretic Peptide: 234 pg/mL — ABNORMAL HIGH (ref 0.0–100.0)

## 2018-06-05 MED ORDER — ACETAMINOPHEN 325 MG PO TABS
650.0000 mg | ORAL_TABLET | Freq: Four times a day (QID) | ORAL | Status: DC | PRN
  Administered 2018-06-06 – 2018-06-07 (×3): 650 mg via ORAL
  Filled 2018-06-05 (×3): qty 2

## 2018-06-05 MED ORDER — FUROSEMIDE 10 MG/ML IJ SOLN
40.0000 mg | Freq: Two times a day (BID) | INTRAMUSCULAR | Status: DC
  Administered 2018-06-06: 40 mg via INTRAVENOUS
  Filled 2018-06-05: qty 4

## 2018-06-05 MED ORDER — HYDRALAZINE HCL 20 MG/ML IJ SOLN: 10.0000 mg | Freq: Four times a day (QID) | INTRAMUSCULAR | Status: DC | PRN

## 2018-06-05 MED ORDER — SODIUM CHLORIDE 0.9% FLUSH
3.0000 mL | Freq: Two times a day (BID) | INTRAVENOUS | Status: DC
  Administered 2018-06-05 – 2018-06-08 (×6): 3 mL via INTRAVENOUS

## 2018-06-05 MED ORDER — FUROSEMIDE 10 MG/ML IJ SOLN
60.0000 mg | Freq: Once | INTRAMUSCULAR | Status: AC
  Administered 2018-06-05: 60 mg via INTRAVENOUS
  Filled 2018-06-05: qty 8

## 2018-06-05 MED ORDER — HYDRALAZINE HCL 50 MG PO TABS
100.0000 mg | ORAL_TABLET | Freq: Three times a day (TID) | ORAL | Status: DC
  Administered 2018-06-05 – 2018-06-06 (×3): 100 mg via ORAL
  Filled 2018-06-05 (×4): qty 2

## 2018-06-05 MED ORDER — ASPIRIN EC 81 MG PO TBEC
81.0000 mg | DELAYED_RELEASE_TABLET | Freq: Every day | ORAL | Status: DC
  Administered 2018-06-05 – 2018-06-09 (×5): 81 mg via ORAL
  Filled 2018-06-05 (×5): qty 1

## 2018-06-05 MED ORDER — LISINOPRIL 10 MG PO TABS
10.0000 mg | ORAL_TABLET | Freq: Every day | ORAL | Status: DC
  Administered 2018-06-05: 10 mg via ORAL
  Filled 2018-06-05: qty 1

## 2018-06-05 MED ORDER — GLIPIZIDE 10 MG PO TABS
10.0000 mg | ORAL_TABLET | Freq: Two times a day (BID) | ORAL | Status: DC
  Administered 2018-06-05: 10 mg via ORAL
  Filled 2018-06-05 (×3): qty 1

## 2018-06-05 MED ORDER — ONDANSETRON HCL 4 MG/2ML IJ SOLN: 4.0000 mg | Freq: Four times a day (QID) | INTRAMUSCULAR | Status: DC | PRN

## 2018-06-05 MED ORDER — ACETAMINOPHEN 650 MG RE SUPP: 650.0000 mg | Freq: Four times a day (QID) | RECTAL | Status: DC | PRN

## 2018-06-05 MED ORDER — ATORVASTATIN CALCIUM 20 MG PO TABS
20.0000 mg | ORAL_TABLET | Freq: Every day | ORAL | Status: DC
  Administered 2018-06-05: 20 mg via ORAL
  Filled 2018-06-05: qty 1

## 2018-06-05 MED ORDER — ONDANSETRON HCL 4 MG PO TABS: 4.0000 mg | ORAL_TABLET | Freq: Four times a day (QID) | ORAL | Status: DC | PRN

## 2018-06-05 MED ORDER — HYDRALAZINE HCL 20 MG/ML IJ SOLN
20.0000 mg | Freq: Once | INTRAMUSCULAR | Status: AC
  Administered 2018-06-05: 20 mg via INTRAVENOUS
  Filled 2018-06-05: qty 1

## 2018-06-05 MED ORDER — ENOXAPARIN SODIUM 40 MG/0.4ML ~~LOC~~ SOLN
40.0000 mg | SUBCUTANEOUS | Status: DC
  Administered 2018-06-05: 40 mg via SUBCUTANEOUS
  Filled 2018-06-05: qty 0.4

## 2018-06-05 MED ORDER — LABETALOL HCL 200 MG PO TABS
300.0000 mg | ORAL_TABLET | Freq: Two times a day (BID) | ORAL | Status: DC
  Administered 2018-06-05 – 2018-06-06 (×3): 300 mg via ORAL
  Filled 2018-06-05 (×5): qty 1

## 2018-06-05 MED ORDER — SODIUM CHLORIDE 0.9% FLUSH: 3.0000 mL | INTRAVENOUS | Status: DC | PRN

## 2018-06-05 MED ORDER — INSULIN ASPART 100 UNIT/ML ~~LOC~~ SOLN
0.0000 [IU] | Freq: Three times a day (TID) | SUBCUTANEOUS | Status: DC
  Administered 2018-06-05: 2 [IU] via SUBCUTANEOUS
  Administered 2018-06-06: 3 [IU] via SUBCUTANEOUS
  Administered 2018-06-06: 5 [IU] via SUBCUTANEOUS
  Administered 2018-06-06: 1 [IU] via SUBCUTANEOUS
  Administered 2018-06-07: 3 [IU] via SUBCUTANEOUS
  Administered 2018-06-07: 2 [IU] via SUBCUTANEOUS
  Administered 2018-06-08: 1 [IU] via SUBCUTANEOUS
  Administered 2018-06-08 (×2): 2 [IU] via SUBCUTANEOUS
  Administered 2018-06-09: 1 [IU] via SUBCUTANEOUS
  Filled 2018-06-05 (×10): qty 1

## 2018-06-05 MED ORDER — INFLUENZA VAC SPLIT QUAD 0.5 ML IM SUSY
0.5000 mL | PREFILLED_SYRINGE | INTRAMUSCULAR | Status: AC
  Administered 2018-06-07: 0.5 mL via INTRAMUSCULAR
  Filled 2018-06-05: qty 0.5

## 2018-06-05 MED ORDER — SODIUM CHLORIDE 0.9 % IV SOLN: 250.0000 mL | INTRAVENOUS | Status: DC | PRN

## 2018-06-05 NOTE — ED Notes (Signed)
Placed on 2L [place]  

## 2018-06-05 NOTE — ED Notes (Signed)
ED Provider at bedside. 

## 2018-06-05 NOTE — ED Provider Notes (Signed)
[facility] Emergency Department Provider Note  Time seen: 1:39 PM  I have reviewed the triage vital signs and the nursing notes.   HISTORY  Chief Complaint Hypertension and Shortness of Breath    HPI Ryan Leonard is a 42 y.o. male with a past medical history of diabetes, hypertension, hyperlipidemia presents to the emergency department for shortness of breath referred from urgent care.  According to the patient over the past 6 months he has had significant lower extremity edema which has been progressively worsening.  States his blood pressure is always high when he goes to his doctor around 180 systolic.  Patient states last night he developed mild cough he felt like his chest was congested like there was fluid or something in his lungs.  States upon awakening this morning the shortness of breath and cough are much worse so he went to urgent care.  Is found to be extremely hypertensive currently 233/113.  Patient was given nitroglycerin ointment and sent to the emergency department via EMS.  Patient denies any chest pain but does state shortness of breath.  Denies any worsening when lying flat.  No home O2 requirement.  Currently satting 90 to 92% on room air.   Past Medical History:  Diagnosis Date  . Depression   . Diabetes mellitus without complication (HCC)   . Hyperlipidemia   . Hypertension   . Peripheral vascular disease [facility])     Patient Active Problem List   Diagnosis Date Noted  . Swelling of limb 02/28/2018  . Essential hypertension 08/26/2017  . PAD (peripheral artery disease) (HCC) 07/26/2017  . Atherosclerosis of native arteries of the extremities with ulceration (HCC) 07/26/2017  . Hyperlipidemia 07/26/2017  . Type 2 diabetes mellitus with circulatory disorder (HCC) 07/26/2017    Past Surgical History:  Procedure Laterality Date  . EYE SURGERY    . LOWER EXTREMITY ANGIOGRAPHY Right 07/28/2017   Procedure: LOWER EXTREMITY ANGIOGRAPHY;   Surgeon: Annice Needy, MD;  Location: [facility] INVASIVE CV LAB;  Service: Cardiovascular;  Laterality: Right;  . WISDOM TOOTH EXTRACTION      Prior to Admission medications   Medication Sig Start Date End Date Taking? Authorizing Provider  amLODipine (NORVASC) 5 MG tablet Take 5 mg by mouth daily.    [provider]  aspirin EC 81 MG tablet Take 1 tablet (81 mg total) by mouth daily. 07/28/17   Annice Needy, MD  atorvastatin (LIPITOR) 20 MG tablet TAKE 1 TABLET (20 MG TOTAL) BY MOUTH ONCE DAILY. qhs 07/04/17   [provider]  canagliflozin (INVOKANA) 100 MG TABS tablet Invokana 100 mg tablet    [provider]  Cholecalciferol (VITAMIN D) 2000 units tablet Take by mouth. 12/05/15   [provider]  glipiZIDE (GLUCOTROL) 10 MG tablet TAKE 1 TABLET (10 MG TOTAL) BY MOUTH 2 (TWO) TIMES DAILY BEFORE MEALS. 07/04/17   [provider]  lisinopril (PRINIVIL,ZESTRIL) 10 MG tablet Take 10 mg by mouth. 04/11/15   [provider]  metFORMIN (GLUCOPHAGE) 500 MG tablet Take 500 mg by mouth 2 (two) times daily with a meal.  04/11/15   [provider]  Vitamin D, Ergocalciferol, (DRISDOL) 50000 units CAPS capsule Take 50,000 Units by mouth every 7 (seven) days.    [provider]    Allergies  Allergen Reactions  . Codeine Nausea Only    Family History  Problem Relation Age of Onset  . Diabetes Maternal Grandmother   . Colon cancer Maternal Grandfather  Social History Social History   Tobacco Use  . Smoking status: Never Smoker  . Smokeless tobacco: Never Used  Substance Use Topics  . Alcohol use: No    Frequency: Never  . Drug use: No    Review of Systems Constitutional: Negative for fever. Cardiovascular: Negative for chest pain. Respiratory: Positive for shortness of breath. Gastrointestinal: Negative for abdominal pain, vomiting  Genitourinary: Negative for urinary compaints Musculoskeletal: Lower extremity wound x6  months. Skin: Ulceration right lower extremity x2 months. Neurological: Negative for headache All other ROS negative  ____________________________________________   PHYSICAL EXAM:  VITAL SIGNS: ED Triage Vitals  Enc Vitals Group     BP 06/05/18 1332 (!) 233/113     Pulse Rate 06/05/18 1332 83     Resp 06/05/18 1332 (!) 21     Temp 06/05/18 1332 98.1 F (36.7 C)     Temp Source 06/05/18 1332 Oral     SpO2 06/05/18 1332 99 %     Weight 06/05/18 1333 238 lb 1.6 oz (108 kg)     Height 06/05/18 1333 6' (1.829 m)     Head Circumference --      Peak Flow --      Pain Score 06/05/18 1333 0     Pain Loc --      Pain Edu? --      Excl. in GC? --    Constitutional: Alert and oriented. Well appearing and in no distress. Eyes: Normal exam ENT   Head: Normocephalic and atraumatic.   Mouth/Throat: Mucous membranes are moist. Cardiovascular: Normal rate, regular rhythm.  Respiratory: Mild tachypnea.  Mild rhonchi bilaterally. Gastrointestinal: Soft and nontender. No distention.   Musculoskeletal: Nontender with normal range of motion in all extremities.  3+ lower extremity edema bilaterally. Neurologic:  Normal speech and language. No gross focal neurologic deficits Skin:  Skin is warm, dry. 3-4 cm diameter chronic ulceration to right lower extremity. Psychiatric: Mood and affect are normal.   ____________________________________________    EKG  EKG viewed and interpreted by myself shows a normal sinus rhythm at 75 bpm with a narrow QRS, normal axis, largely normal intervals with nonspecific ST changes.  No ST elevation.  ____________________________________________    RADIOLOGY  Chest x-ray shows no significant findings.  ____________________________________________   INITIAL IMPRESSION / ASSESSMENT AND PLAN / ED COURSE  Pertinent labs & imaging results that were available during my care of the patient were reviewed by me and considered in my medical decision  making (see chart for details).  Patient presents to the emergency department for shortness of breath found to be extremely hypertensive 233/113 with 3+ bilateral lower extremity pitting edema.  Satting between 9092% on room air currently.  We will place the patient on supplemental oxygen.  Differential at this time would include new onset pulmonary edema, congestive heart failure, pneumonia, pneumonitis, pneumothorax, ACS, hypertensive emergency.  Patient has nitroglycerin ointment on his chest.  We will add IV hydralazine.  We will check labs, chest x-ray and continue to closely monitor.  Anticipate likely admission to the hospital for continued treatment and likely significant diuresis.  Labs show renal insufficiency otherwise largely within normal limits.  BNP is elevated 230.  Chest x-ray does not show any significant edema however given the patient's hypertensive status with rhonchi auscultated with 3+ pitting edema clinical picture very consistent with new onset CHF/hypertensive emergency.  Patient's blood pressure is down to 178/94 after 20 mg of hydralazine and 1.5 inch of nitroglycerin paste.  Patient will be admitted to the hospital service for further treatment.  Patient bolused 60 mg IV Lasix. ____________________________________________   FINAL CLINICAL IMPRESSION(S) / ED DIAGNOSES  Hypertensive emergency Congestive heart failure Acute renal insufficiency   Minna AntisPaduchowski, Allysha Tryon, MD 06/05/18 1450

## 2018-06-05 NOTE — Plan of Care (Signed)
  Problem: Education: Goal: Ability to demonstrate management of disease process will improve Outcome: Progressing   Problem: Health Behavior/Discharge Planning: Goal: Ability to identify and utilize available resources and services will improve Outcome: Progressing   Problem: Nutritional: Goal: Maintenance of adequate nutrition will improve Outcome: Progressing

## 2018-06-05 NOTE — ED Notes (Signed)
Report to Kim, RN

## 2018-06-05 NOTE — ED Notes (Signed)
Floor (2A) was called and notified prior to leaving the ED. Pt was placed in the room and set up. RN Jamesetta Orleansricka was called and notified that pt was in the room. Nothing was needed from the pt prior to my departure

## 2018-06-05 NOTE — ED Provider Notes (Addendum)
[facility]    CSN: [id number] Arrival date & time: 06/05/18  1100  History   Chief Complaint Chief Complaint  Patient presents with  . Cough  . Shortness of Breath   HPI  42 year old male presents with cough and shortness of breath.  Patient has a complicated history.  Uncontrolled diabetes as well as uncontrolled hypertension.  Has had peripheral artery disease requiring stenting as a result.  Patient reports that he developed cough congestion, and associated shortness of breath last night/early this morning.  Started around 2 AM.  Patient endorses ongoing lower extremity edema.  This is been progressing for the past several months.  No documented fever.  Patient states that he was very short of breath last night.  Had difficulty lying down.  Associated cough and congestion.  Patient notes that he had a brief febrile illness last Saturday but resolved within 24 hours.  Patient has no other associated symptoms or concerns at this time.  PMH, Surgical Hx, Family Hx, Social History reviewed and updated as below.  Past Medical History:  Diagnosis Date  . Depression   . Diabetes mellitus without complication (HCC)   . Hyperlipidemia   . Hypertension   . Peripheral vascular disease [facility](HCC)     Patient Active Problem List   Diagnosis Date Noted  . Swelling of limb 02/28/2018  . Essential hypertension 08/26/2017  . PAD (peripheral artery disease) (HCC) 07/26/2017  . Atherosclerosis of native arteries of the extremities with ulceration (HCC) 07/26/2017  . Hyperlipidemia 07/26/2017  . Type 2 diabetes mellitus with circulatory disorder (HCC) 07/26/2017    Past Surgical History:  Procedure Laterality Date  . EYE SURGERY    . LOWER EXTREMITY ANGIOGRAPHY Right 07/28/2017   Procedure: LOWER EXTREMITY ANGIOGRAPHY;  Surgeon: Annice Needyew, Jason S, MD;  Location: [facility] INVASIVE CV LAB;  Service: Cardiovascular;  Laterality: Right;  . WISDOM TOOTH EXTRACTION       Home Medications     Prior to Admission medications   Medication Sig Start Date End Date Taking? Authorizing Provider  amLODipine (NORVASC) 5 MG tablet Take 5 mg by mouth daily.   Yes [provider]  aspirin EC 81 MG tablet Take 1 tablet (81 mg total) by mouth daily. 07/28/17  Yes Dew, Marlow BaarsJason S, MD  atorvastatin (LIPITOR) 20 MG tablet TAKE 1 TABLET (20 MG TOTAL) BY MOUTH ONCE DAILY. qhs 07/04/17  Yes [provider]  Cholecalciferol (VITAMIN D) 2000 units tablet Take by mouth. 12/05/15  Yes [provider]  glipiZIDE (GLUCOTROL) 10 MG tablet TAKE 1 TABLET (10 MG TOTAL) BY MOUTH 2 (TWO) TIMES DAILY BEFORE MEALS. 07/04/17  Yes [provider]  lisinopril (PRINIVIL,ZESTRIL) 10 MG tablet Take 10 mg by mouth. 04/11/15  Yes [provider]  metFORMIN (GLUCOPHAGE) 500 MG tablet Take 500 mg by mouth 2 (two) times daily with a meal.  04/11/15  Yes [provider]  Vitamin D, Ergocalciferol, (DRISDOL) 50000 units CAPS capsule Take 50,000 Units by mouth every 7 (seven) days.   Yes [provider]  canagliflozin (INVOKANA) 100 MG TABS tablet Invokana 100 mg tablet    [provider]    Family History Family History  Problem Relation Age of Onset  . Diabetes Maternal Grandmother   . Colon cancer Maternal Grandfather     Social History Social History   Tobacco Use  . Smoking status: Never Smoker  . Smokeless tobacco: Never Used  Substance Use Topics  . Alcohol use: No  Frequency: Never  . Drug use: No     Allergies   Codeine   Review of Systems Review of Systems  Constitutional: Negative for fever.  Respiratory: Positive for cough and shortness of breath.   Cardiovascular: Positive for leg swelling.   Physical Exam Triage Vital Signs ED Triage Vitals [06/05/18 1146]  Enc Vitals Group     BP (!) 205/106     Pulse Rate 91     Resp 20     Temp 98.4 F (36.9 C)     Temp Source Oral     SpO2 94 %     Weight 240 lb (108.9 kg)      Height 6' (1.829 m)     Head Circumference      Peak Flow      Pain Score 0     Pain Loc      Pain Edu?      Excl. in GC?    Updated Vital Signs BP (!) 205/106 (BP Location: Right Arm) Comment: patient has not taken his BP medication this morning  Pulse 91   Temp 98.4 F (36.9 C) (Oral)   Resp 20   Ht 6' (1.829 m)   Wt 108.9 kg   SpO2 94%   BMI 32.55 kg/m   Visual Acuity Right Eye Distance:   Left Eye Distance:   Bilateral Distance:    Right Eye Near:   Left Eye Near:    Bilateral Near:     Physical Exam Vitals signs and nursing note reviewed.  Constitutional:      General: He is not in acute distress. HENT:     Head: Normocephalic and atraumatic.     Mouth/Throat:     Mouth: Mucous membranes are moist.     Pharynx: Oropharynx is clear.  Cardiovascular:     Rate and Rhythm: Normal rate and regular rhythm.     Comments: 3+ pitting lower extremity edema up to the knee. Pulmonary:     Effort: Pulmonary effort is normal.     Comments: Coarse breath sounds and wheezing throughout. Skin:    Comments: Patient has an open wound to his right medial ankle/foot.  Neurological:     General: No focal deficit present.     Mental Status: He is alert.  Psychiatric:        Mood and Affect: Mood normal.        Behavior: Behavior normal.    UC Treatments / Results  Labs (all labs ordered are listed, but only abnormal results are displayed) Labs Reviewed - No data to display  EKG None  Radiology No results found.  Procedures Procedures (including critical care time)  Medications Ordered in UC Medications - No data to display  Initial Impression / Assessment and Plan / UC Course  I have reviewed the triage vital signs and the nursing notes.  Pertinent labs & imaging results that were available during my care of the patient were reviewed by me and considered in my medical decision making (see chart for details).    42 year old male presents with hypertensive  emergency.  Blood pressure 205/106, severe pitting edema, and acute onset dyspnea.  I am concerned about underlying congestive heart failure.  Patient going to the hospital via EMS.  Final Clinical Impressions(s) / UC Diagnoses   Final diagnoses:  Hypertensive emergency   Discharge Instructions   None    ED Prescriptions    None     Controlled Substance Prescriptions [place] Controlled  Substance Registry consulted? Not Applicable   Tommie SamsCook, Jaiceon Collister G, DO 06/05/18 1235    Everlene Otherook, Kaitlynne Wenz G, DO 06/05/18 1235

## 2018-06-05 NOTE — ED Triage Notes (Signed)
Patient c/o cough, congestion and wheezing that started at 2am this morning. He states he was sick last week with a fever of 102.9, vomiting and diarrhea that lasted 1 day.

## 2018-06-05 NOTE — ED Triage Notes (Signed)
[facility] Pt given 1.5 inch nitro paste by EMS, hypertensive with EMS, arrives on 2L [place] 91-94% on RA. Pt alert and oriented X4, active, cooperative, pt in NAD. RR even and unlabored, color WNL.    SOB since last night, nonproductive cough. Denies pain. 18 G to RAC, CBG 257

## 2018-06-05 NOTE — H&P (Signed)
[employer] - [place] at [facility]   PATIENT NAME: Ryan ChurchBrian Leonard    MR#:  [id number]  DATE OF BIRTH:  1975-07-09  DATE OF ADMISSION:  06/05/2018  PRIMARY CARE PHYSICIAN: Patient, No Pcp Per   REQUESTING/REFERRING PHYSICIAN: Minna AntisPaduchowski, Kevin, MD  CHIEF COMPLAINT:   Chief Complaint  Patient presents with  . Hypertension  . Shortness of Breath    HISTORY OF PRESENT ILLNESS: Ryan ChurchBrian Leonard  is a 42 y.o. male with a known history of depression, diabetes, hyperlipidemia, hypertension and peripheral vascular disease who is presenting to the hospital with complaint of shortness of breath and elevated blood pressure.  Patient states that his blood pressure is been very high for the past few months.  Has been taking blood pressure medications.  States that he used to go see a primary doctor but now they have retired and he does not have a primary care physician.  Patient also complains of shortness of breath getting progressively worse.  Complains of dry cough.  He also reports that he is gained significant weight and has noted his lower extremity and thighs to be very swollen. PAST MEDICAL HISTORY:   Past Medical History:  Diagnosis Date  . Depression   . Diabetes mellitus without complication (HCC)   . Hyperlipidemia   . Hypertension   . Peripheral vascular disease (HCC)     PAST SURGICAL HISTORY:  Past Surgical History:  Procedure Laterality Date  . EYE SURGERY    . LOWER EXTREMITY ANGIOGRAPHY Right 07/28/2017   Procedure: LOWER EXTREMITY ANGIOGRAPHY;  Surgeon: Annice Needyew, Jason S, MD;  Location: [facility] INVASIVE CV LAB;  Service: Cardiovascular;  Laterality: Right;  . WISDOM TOOTH EXTRACTION      SOCIAL HISTORY:  Social History   Tobacco Use  . Smoking status: Never Smoker  . Smokeless tobacco: Never Used  Substance Use Topics  . Alcohol use: No    Frequency: Never    FAMILY HISTORY:  Family History  Problem Relation Age of Onset  . Diabetes Maternal  Grandmother   . Colon cancer Maternal Grandfather     DRUG ALLERGIES:  Allergies  Allergen Reactions  . Codeine Nausea Only    REVIEW OF SYSTEMS:   CONSTITUTIONAL: No fever, fatigue or weakness.  EYES: No blurred or double vision.  EARS, NOSE, AND THROAT: No tinnitus or ear pain.  RESPIRATORY: Positive cough, positive shortness of breath, wheezing or hemoptysis.  CARDIOVASCULAR: No chest pain, orthopnea, positive edema.  GASTROINTESTINAL: No nausea, vomiting, diarrhea or abdominal pain.  GENITOURINARY: No dysuria, hematuria.  ENDOCRINE: No polyuria, nocturia,  HEMATOLOGY: No anemia, easy bruising or bleeding SKIN: No rash or lesion. MUSCULOSKELETAL: No joint pain or arthritis.   NEUROLOGIC: No tingling, numbness, weakness.  PSYCHIATRY: No anxiety or depression.   MEDICATIONS AT HOME:  Prior to Admission medications   Medication Sig Start Date End Date Taking? Authorizing Provider  amLODipine (NORVASC) 5 MG tablet Take 5 mg by mouth daily.    [provider]  aspirin EC 81 MG tablet Take 1 tablet (81 mg total) by mouth daily. 07/28/17   Annice Needyew, Jason S, MD  atorvastatin (LIPITOR) 20 MG tablet TAKE 1 TABLET (20 MG TOTAL) BY MOUTH ONCE DAILY. qhs 07/04/17   [provider]  canagliflozin (INVOKANA) 100 MG TABS tablet Invokana 100 mg tablet    [provider]  Cholecalciferol (VITAMIN D) 2000 units tablet Take by mouth. 12/05/15   [provider]  glipiZIDE (GLUCOTROL) 10 MG tablet TAKE 1 TABLET (  10 MG TOTAL) BY MOUTH 2 (TWO) TIMES DAILY BEFORE MEALS. 07/04/17   [provider]  lisinopril (PRINIVIL,ZESTRIL) 10 MG tablet Take 10 mg by mouth. 04/11/15   [provider]  metFORMIN (GLUCOPHAGE) 500 MG tablet Take 500 mg by mouth 2 (two) times daily with a meal.  04/11/15   [provider]  Vitamin D, Ergocalciferol, (DRISDOL) 50000 units CAPS capsule Take 50,000 Units by mouth every 7 (seven) days.    [provider]       PHYSICAL EXAMINATION:   VITAL SIGNS: Blood pressure (!) 178/94, pulse 90, temperature 98.1 F (36.7 C), temperature source Oral, resp. rate 17, height 6' (1.829 m), weight 108 kg, SpO2 96 %.  GENERAL:  42 y.o.-year-old patient lying in the bed with no acute distress.  EYES: Pupils equal, round, reactive to light and accommodation. No scleral icterus. Extraocular muscles intact.  HEENT: Head atraumatic, normocephalic. Oropharynx and nasopharynx clear.  NECK:  Supple, no jugular venous distention. No thyroid enlargement, no tenderness.  LUNGS: Rhonchus breath sounds bilaterally.  CARDIOVASCULAR: S1, S2 normal. No murmurs, rubs, or gallops.  ABDOMEN: Soft, nontender, nondistended. Bowel sounds present. No organomegaly or mass.  EXTREMITIES: 2+ pedal edema, cyanosis, or clubbing.  NEUROLOGIC: Cranial nerves II through XII are intact. Muscle strength 5/5 in all extremities. Sensation intact. Gait not checked.  PSYCHIATRIC: The patient is alert and oriented x 3.  SKIN: Patient has a ulcer on the right lower extremity    LABORATORY PANEL:   CBC Recent Labs  Lab 06/05/18 1337  WBC 9.4  HGB 10.9*  HCT 34.2*  PLT 322  MCV 83.6  MCH 26.7  MCHC 31.9  RDW 14.2   ------------------------------------------------------------------------------------------------------------------  Chemistries  Recent Labs  Lab 06/05/18 1337  NA 138  K 4.0  CL 106  CO2 26  GLUCOSE 215*  BUN 18  CREATININE 1.62*  CALCIUM 8.8*  AST 19  ALT 18  ALKPHOS 91  BILITOT 0.7   ------------------------------------------------------------------------------------------------------------------ estimated creatinine clearance is 73.9 mL/min (A) (by C-G formula based on SCr of 1.62 mg/dL (H)). ------------------------------------------------------------------------------------------------------------------ No results for input(s): TSH, T4TOTAL, T3FREE, THYROIDAB in the last 72 hours.  Invalid input(s):  FREET3   Coagulation profile No results for input(s): INR, PROTIME in the last 168 hours. ------------------------------------------------------------------------------------------------------------------- No results for input(s): DDIMER in the last 72 hours. -------------------------------------------------------------------------------------------------------------------  Cardiac Enzymes Recent Labs  Lab 06/05/18 1337  TROPONINI <0.03   ------------------------------------------------------------------------------------------------------------------ Invalid input(s): POCBNP  ---------------------------------------------------------------------------------------------------------------  Urinalysis No results found for: COLORURINE, APPEARANCEUR, LABSPEC, PHURINE, GLUCOSEU, HGBUR, BILIRUBINUR, KETONESUR, PROTEINUR, UROBILINOGEN, NITRITE, LEUKOCYTESUR   RADIOLOGY: Dg Chest Portable 1 View  Result Date: 06/05/2018 CLINICAL DATA:  Cough, wheezing, and chest congestion which began at 2 a.m. this morning. Recent fever with vomiting and diarrhea lasting 1 day last week. History of diabetes, hypertension. EXAM: PORTABLE CHEST 1 VIEW COMPARISON:  None. FINDINGS: The lungs are adequately inflated. There is no focal infiltrate. There is no pleural effusion. The right hemidiaphragm is mildly elevated as compared to the left. The heart and pulmonary vascularity are normal. The mediastinum is normal in width. The bony thorax exhibits no acute abnormality. IMPRESSION: There is no pneumonia nor other acute cardiopulmonary abnormality. Mild elevation of the right hemidiaphragm may be normal for the patient, but right hemidiaphragm dysfunction is not excluded. I have no previous studies with which to compare. Electronically Signed   By: David  SwazilandJordan M.D.   On: 06/05/2018 13:50    EKG: No orders found for this or  any previous visit.  IMPRESSION AND PLAN: Patient is 42 year old with history of  hypertension diabetes hyperlipidemia questionable medical compliance presenting with shortness of breath accelerated hypertension  1.  Accelerated hypertension At this point I will start patient on hydralazine and labetalol Hold lisinopril in light of elevated creatinine Discontinue Norvasc in light of significant lower extremity edema Use IV hydralazine PRN  2.  Shortness of breath chest x-ray is negative however he appears to have significant fluid overload We will give him IV Lasix Obtain echocardiogram of the heart  3.  Acute renal failure suspect likely has significant proteinuria Albumin is 3 I will ask nephrology to see   4.  Right lower extremity ulcer we will ask wound care nurse to see   5.  Diabetes type 2 continue Glucotrol check a hemoglobin A1c Place him on sliding scale insulin discontinue metformin and invonka  6.  Miscellaneous we will do Lovenox for DVT prophylaxis   All the records are reviewed and case discussed with ED provider. Management plans discussed with the patient, family and they are in agreement.  CODE STATUS: Code Status History    Date Active Date Inactive Code Status Order ID Comments User Context   07/28/2017 1040 07/28/2017 1511 Full Code [id number]  Annice Needy, MD Inpatient       TOTAL TIME TAKING CARE OF THIS PATIENT: 55 minutes.    Auburn Bilberry M.D on 06/05/2018 at 2:54 PM  Between 7am to 6pm - Pager - [phone]  After 6pm go to [email or web address] - password [email or web address] [facility]  [employer] Office  [phone]  CC: Primary care physician; Patient, No Pcp Per

## 2018-06-06 ENCOUNTER — Inpatient Hospital Stay: Payer: Managed Care, Other (non HMO)

## 2018-06-06 ENCOUNTER — Inpatient Hospital Stay
Admit: 2018-06-06 | Discharge: 2018-06-06 | Disposition: A | Discharge status: Home / Self Care | Payer: Managed Care, Other (non HMO) | Attending: Internal Medicine | Admitting: Internal Medicine

## 2018-06-06 LAB — CBC
HCT: 27.2 % — ABNORMAL LOW (ref 39.0–52.0)
Hemoglobin: 8.5 g/dL — ABNORMAL LOW (ref 13.0–17.0)
MCH: 25.9 pg — ABNORMAL LOW (ref 26.0–34.0)
MCHC: 31.3 g/dL (ref 30.0–36.0)
MCV: 82.9 fL (ref 80.0–100.0)
PLATELETS: 287 10*3/uL (ref 150–400)
RBC: 3.28 MIL/uL — ABNORMAL LOW (ref 4.22–5.81)
RDW: 14.5 % (ref 11.5–15.5)
WBC: 9.2 10*3/uL (ref 4.0–10.5)
nRBC: 0 % (ref 0.0–0.2)

## 2018-06-06 LAB — BASIC METABOLIC PANEL
Anion gap: 9 (ref 5–15)
BUN: 25 mg/dL — ABNORMAL HIGH (ref 6–20)
CALCIUM: 8 mg/dL — AB (ref 8.9–10.3)
CO2: 24 mmol/L (ref 22–32)
Chloride: 105 mmol/L (ref 98–111)
Creatinine, Ser: 2.58 mg/dL — ABNORMAL HIGH (ref 0.61–1.24)
GFR calc Af Amer: 34 mL/min — ABNORMAL LOW (ref >60)
GFR calc non Af Amer: 29 mL/min — ABNORMAL LOW (ref >60)
Glucose, Bld: 190 mg/dL — ABNORMAL HIGH (ref 70–99)
Potassium: 3.4 mmol/L — ABNORMAL LOW (ref 3.5–5.1)
Sodium: 138 mmol/L (ref 135–145)

## 2018-06-06 LAB — URINALYSIS, ROUTINE W REFLEX MICROSCOPIC
Bacteria, UA: NONE SEEN
Bilirubin Urine: NEGATIVE
Glucose, UA: 50 mg/dL — AB
HGB URINE DIPSTICK: NEGATIVE
Ketones, ur: NEGATIVE mg/dL
Leukocytes, UA: NEGATIVE
Nitrite: NEGATIVE
Protein, ur: 100 mg/dL — AB
Specific Gravity, Urine: 1.01 (ref 1.005–1.030)
Squamous Epithelial / HPF: NONE SEEN (ref 0–5)
pH: 6 (ref 5.0–8.0)

## 2018-06-06 LAB — GLUCOSE, CAPILLARY
Glucose-Capillary: 137 mg/dL — ABNORMAL HIGH (ref 70–99)
Glucose-Capillary: 256 mg/dL — ABNORMAL HIGH (ref 70–99)
Glucose-Capillary: 207 mg/dL — ABNORMAL HIGH (ref 70–99)
Glucose-Capillary: 110 mg/dL — ABNORMAL HIGH (ref 70–99)

## 2018-06-06 LAB — TROPONIN I
Troponin I: 0.19 ng/mL (ref <0.03)
Troponin I: 0.25 ng/mL (ref <0.03)

## 2018-06-06 MED ORDER — ATORVASTATIN CALCIUM 20 MG PO TABS
40.0000 mg | ORAL_TABLET | Freq: Every day | ORAL | Status: DC
  Administered 2018-06-06 – 2018-06-08 (×3): 40 mg via ORAL
  Filled 2018-06-06 (×3): qty 2

## 2018-06-06 MED ORDER — HEPARIN SODIUM (PORCINE) 5000 UNIT/ML IJ SOLN
5000.0000 [IU] | Freq: Three times a day (TID) | INTRAMUSCULAR | Status: DC
  Administered 2018-06-06 – 2018-06-09 (×8): 5000 [IU] via SUBCUTANEOUS
  Filled 2018-06-06 (×8): qty 1

## 2018-06-06 MED ORDER — LABETALOL HCL 100 MG PO TABS
100.0000 mg | ORAL_TABLET | Freq: Two times a day (BID) | ORAL | Status: DC
  Administered 2018-06-06 – 2018-06-09 (×6): 100 mg via ORAL
  Filled 2018-06-06 (×7): qty 1

## 2018-06-06 MED ORDER — POTASSIUM CHLORIDE CRYS ER 20 MEQ PO TBCR
40.0000 meq | EXTENDED_RELEASE_TABLET | Freq: Once | ORAL | Status: AC
  Administered 2018-06-06: 40 meq via ORAL
  Filled 2018-06-06: qty 2

## 2018-06-06 MED ORDER — HEPARIN SODIUM (PORCINE) 5000 UNIT/ML IJ SOLN: 5000.0000 [IU] | Freq: Three times a day (TID) | INTRAMUSCULAR | Status: DC

## 2018-06-06 MED ORDER — HYDRALAZINE HCL 50 MG PO TABS
50.0000 mg | ORAL_TABLET | Freq: Three times a day (TID) | ORAL | Status: DC
  Administered 2018-06-07 – 2018-06-08 (×4): 50 mg via ORAL
  Filled 2018-06-06 (×4): qty 1

## 2018-06-06 NOTE — Progress Notes (Addendum)
Pt complaining of dizziness, lightheaded, per pt feels "very tired" BP 117/55, HR 85, NRS on tele. No cp.Temp 99.4, CBG 256. Decreased urine output. Tylenol given.  Dr. Imogene Burnhen notified and will come assess pt.  Will continue to monitor

## 2018-06-06 NOTE — Progress Notes (Signed)
CRITICAL VALUE ALERT  Critical Value:  Troponin 0.25  Date & Time Notied:  06/06/18 1712  Provider Notified: Dr. Graylon GoodKowalski/Dr. Chen  Orders Received/Actions taken: no new orders

## 2018-06-06 NOTE — Plan of Care (Signed)
  Problem: Activity: Goal: Capacity to carry out activities will improve Outcome: Progressing   Problem: Cardiac: Goal: Ability to achieve and maintain adequate cardiopulmonary perfusion will improve Outcome: Progressing   Problem: Fluid Volume: Goal: Ability to maintain a balanced intake and output will improve Outcome: Progressing

## 2018-06-06 NOTE — Consult Note (Signed)
[facility] Consultation Note  Patient ID: Ryan Leonard, MRN: [id number], DOB/AGE: 15-Jun-1975 42 y.o. Admit date: 06/05/2018   Date of Consult: 06/06/2018 Primary Physician: Patient, No Pcp Per Primary Cardiologist: None  Chief Complaint:  Chief Complaint  Patient presents with  . Hypertension  . Shortness of Breath   Reason for Consult: Elevated troponin  HPI: 42 y.o. male with known significant diabetes with complications essential hypertension mixed hyperlipidemia peripheral vascular disease with ulcerations having a significantly worsening shortness of breath with and without physical activity in the last several days.  The patient has had some weight gain with this and lower extremity edema to a great extent.  Chest x-ray has been normal but the patient does have an elevated troponin of 0.019 possibly consistent with demand ischemia and/or kidney dysfunction.  He has a glomerular filtration rate of 34 and anemia with a hemoglobin of 8.5 likely causing the majority of his edema shortness of breath and weakness.  There is been no evidence of chest pain at this time an EKG shows no evidence of myocardial infarction.  Patient is weak and fatigued at this time and not very conversant  Past Medical History:  Diagnosis Date  . Depression   . Diabetes mellitus without complication (HCC)   . Hyperlipidemia   . Hypertension   . Peripheral vascular disease [facility])       Surgical History:  Past Surgical History:  Procedure Laterality Date  . EYE SURGERY    . LOWER EXTREMITY ANGIOGRAPHY Right 07/28/2017   Procedure: LOWER EXTREMITY ANGIOGRAPHY;  Surgeon: Annice Needy, MD;  Location: [facility] INVASIVE CV LAB;  Service: Cardiovascular;  Laterality: Right;  . WISDOM TOOTH EXTRACTION       Home Meds: Prior to Admission medications   Medication Sig Start Date End Date Taking? Authorizing Provider  amLODipine (NORVASC) 10 MG tablet Take 10 mg by mouth daily.    Yes [provider]  aspirin EC 81 MG tablet Take 1 tablet (81 mg total) by mouth daily. 07/28/17  Yes Dew, Marlow Baars, MD  atorvastatin (LIPITOR) 20 MG tablet Take 20 mg by mouth at bedtime.    Yes [provider]  Cholecalciferol (VITAMIN D) 125 MCG (5000 UT) CAPS Take 5,000 Units by mouth daily.    Yes [provider]  glipiZIDE (GLUCOTROL) 10 MG tablet Take 10 mg by mouth 2 (two) times daily.    Yes [provider]  lisinopril (PRINIVIL,ZESTRIL) 40 MG tablet Take 40 mg by mouth daily.    Yes [provider]  metFORMIN (GLUCOPHAGE) 500 MG tablet Take 500 mg by mouth 2 (two) times daily with a meal.  04/11/15  Yes [provider]    Inpatient Medications:  . aspirin EC  81 mg Oral Daily  . atorvastatin  40 mg Oral q1800  . heparin injection (subcutaneous)  5,000 Units Subcutaneous Q8H  . hydrALAZINE  50 mg Oral Q8H  . Influenza vac split quadrivalent PF  0.5 mL Intramuscular Tomorrow-1000  . insulin aspart  0-9 Units Subcutaneous TID WC  . labetalol  100 mg Oral BID  . potassium chloride  40 mEq Oral Once  . sodium chloride flush  3 mL Intravenous Q12H   . sodium chloride      Allergies:  Allergies  Allergen Reactions  . Codeine Nausea Only    Social History   Socioeconomic History  . Marital status: Married    Spouse name: Not on file  . Number of  children: Not on file  . Years of education: Not on file  . Highest education level: Not on file  Occupational History  . Not on file  Social Needs  . Financial resource strain: Not on file  . Food insecurity:    Worry: Not on file    Inability: Not on file  . Transportation needs:    Medical: Not on file    Non-medical: Not on file  Tobacco Use  . Smoking status: Never Smoker  . Smokeless tobacco: Never Used  Substance and Sexual Activity  . Alcohol use: No    Frequency: Never  . Drug use: No  . Sexual activity: Not on file  Lifestyle  . Physical activity:    Days per week: Not  on file    Minutes per session: Not on file  . Stress: Not on file  Relationships  . Social connections:    Talks on phone: Not on file    Gets together: Not on file    Attends religious service: Not on file    Active member of club or organization: Not on file    Attends meetings of clubs or organizations: Not on file    Relationship status: Not on file  . Intimate partner violence:    Fear of current or ex partner: Not on file    Emotionally abused: Not on file    Physically abused: Not on file    Forced sexual activity: Not on file  Other Topics Concern  . Not on file  Social History Narrative  . Not on file     Family History  Problem Relation Age of Onset  . Diabetes Maternal Grandmother   . Colon cancer Maternal Grandfather      Review of Systems Positive for shortness of breath weakness fatigue Negative for: General:  chills, fever, night sweats or weight changes.  Cardiovascular: PND orthopnea syncope dizziness  Dermatological skin lesions rashes Respiratory: Cough congestion Urologic: Frequent urination urination at night and hematuria Abdominal: negative for nausea, vomiting, diarrhea, bright red blood per rectum, melena, or hematemesis Neurologic: negative for visual changes, and/or hearing changes  All other systems reviewed and are otherwise negative except as noted above.  Labs: Recent Labs    06/05/18 1727 06/05/18 2306 06/06/18 0519 06/06/18 1632  TROPONINI 0.03* 0.04* 0.19* 0.25*   Lab Results  Component Value Date   WBC 9.2 06/06/2018   HGB 8.5 (L) 06/06/2018   HCT 27.2 (L) 06/06/2018   MCV 82.9 06/06/2018   PLT 287 06/06/2018    Recent Labs  Lab 06/05/18 1337  06/06/18 0519  NA 138  --  138  K 4.0  --  3.4*  CL 106  --  105  CO2 26  --  24  BUN 18  --  25*  CREATININE 1.62*   < > 2.58*  CALCIUM 8.8*  --  8.0*  PROT 6.9  --   --   BILITOT 0.7  --   --   ALKPHOS 91  --   --   ALT 18  --   --   AST 19  --   --   GLUCOSE 215*  --   190*   < > = values in this interval not displayed.   No results found for: CHOL, HDL, LDLCALC, TRIG No results found for: DDIMER  Radiology/Studies:  [place] Renal  Result Date: 06/05/2018 CLINICAL DATA:  Acute renal failure EXAM: RENAL / URINARY TRACT ULTRASOUND COMPLETE COMPARISON:  None. FINDINGS: Right Kidney: Renal measurements: 10.9 x 5.7 x 6.1 cm = volume: 199 mL. The renal cortical echotexture exceeds that of the adjacent liver. There is no hydronephrosis nor cystic or solid mass demonstrated. Left Kidney: Renal measurements: 11.8 x 6.4 x 6.2 cm = volume: 244 mL. The cortical echotexture is increased similar to that on the right. There is no cystic or solid mass nor hydronephrosis. Bladder: Appears normal for degree of bladder distention. Bilateral ureteral jets are observed. IMPRESSION: Increased renal cortical echotexture bilaterally consistent with medical renal disease. No suspicious masses. No hydronephrosis. Bilateral ureteral jets are observed in the bladder. Electronically Signed   By: David  [place] M.D.   On: 06/05/2018 15:56   Dg Chest Portable 1 View  Result Date: 06/05/2018 CLINICAL DATA:  Cough, wheezing, and chest congestion which began at 2 a.m. this morning. Recent fever with vomiting and diarrhea lasting 1 day last week. History of diabetes, hypertension. EXAM: PORTABLE CHEST 1 VIEW COMPARISON:  None. FINDINGS: The lungs are adequately inflated. There is no focal infiltrate. There is no pleural effusion. The right hemidiaphragm is mildly elevated as compared to the left. The heart and pulmonary vascularity are normal. The mediastinum is normal in width. The bony thorax exhibits no acute abnormality. IMPRESSION: There is no pneumonia nor other acute cardiopulmonary abnormality. Mild elevation of the right hemidiaphragm may be normal for the patient, but right hemidiaphragm dysfunction is not excluded. I have no previous studies with which to compare. Electronically Signed   By:  David  [place] M.D.   On: 06/05/2018 13:50    EKG: Normal sinus rhythm  Weights: Filed Weights   06/05/18 1333 06/05/18 1735 06/06/18 0436  Weight: 108 kg 111.5 kg 112.8 kg     Physical Exam: Blood pressure (!) 117/55, pulse 87, temperature 99.4 F (37.4 C), temperature source Oral, resp. rate 18, height 6' (1.829 m), weight 112.8 kg, SpO2 97 %. Body mass index is 33.72 kg/m. General: Well developed, well nourished, in no acute distress. Head eyes ears nose throat: Normocephalic, atraumatic, sclera non-icteric, no xanthomas, nares are without discharge. No apparent thyromegaly and/or mass  Lungs: Normal respiratory effort.  no wheezes, no rales, no rhonchi.  Heart: RRR with normal S1 S2. no murmur gallop, no rub, PMI is normal size and placement, carotid upstroke normal without bruit, jugular venous pressure is normal Abdomen: Soft, non-tender, non-distended with normoactive bowel sounds. No hepatomegaly. No rebound/guarding. No obvious abdominal masses. Abdominal aorta is normal size without bruit Extremities: 1+ edema. no cyanosis, no clubbing, positive ulcers  Peripheral : 2+ bilateral upper extremity pulses, 2+ bilateral femoral pulses, 2+ bilateral dorsal pedal pulse Neuro: Alert and oriented. No facial asymmetry. No focal deficit. Moves all extremities spontaneously. Musculoskeletal: Normal muscle tone without kyphosis Psych:  Responds to questions appropriately with a normal affect.    Assessment: 42 year old male with acute on chronic shortness of breath most likely secondary to kidney dysfunction anemia diabetes without evidence of congestive heart failure or pulmonary edema and an elevated troponin most consistent with demand ischemia rather than acute coronary syndrome  Plan: 1.  Continue supportive care of acute anemia and kidney dysfunction likely causing the majority of his shortness of breath 2.  Echocardiogram for LV systolic dysfunction valvular heart disease  contributing to above 3.  Continue medication management for hypertension control although currently abstain due to lower blood pressure and weakness and fatigue and possible overdiuresis 4.  Begin ambulation and follow for improvements of symptoms  Signed, Bruce  Mardene SayerJ Kowalski M.D. [facility] 06/06/2018, 5:11 PM

## 2018-06-06 NOTE — Consult Note (Signed)
[facility]  CONSULT NOTE    Date: 06/06/2018                  Patient Name:  Ryan Leonard  MRN: [id number]  DOB: 1976/04/28  Age / Sex: 42 y.o., male         PCP: Patient, No Pcp Per                 Service Requesting Consult: Dr. Allena KatzPatel                 Reason for Consult: Acute renal failure            History of Present Illness: Ryan Leonard is a 42 y.o. black male with diabetes mellitus type II, hypertension, hyperlipidemia, peripheral vascular disease, depression, who was admitted to [facility] on 06/05/2018 for Hypoxia [R09.02] Acute renal failure (ARF) (HCC) [N17.9] Hypertensive urgency [I16.0] Dyspnea, unspecified type [R06.00]  Patient creatinine has risen today. Furosemide and lisinopril held. Nephrology consulted.   Patient found to have large diabetic foot ulcer. Wound care has wrapped feet.  Patient is complaining of peripheral edema for more than 3 months.    Medications: Outpatient medications: Medications Prior to Admission  Medication Sig Dispense Refill Last Dose  . amLODipine (NORVASC) 10 MG tablet Take 10 mg by mouth daily.    06/04/2018 at 0800  . aspirin EC 81 MG tablet Take 1 tablet (81 mg total) by mouth daily. 150 tablet 2 06/04/2018 at Unknown time  . atorvastatin (LIPITOR) 20 MG tablet Take 20 mg by mouth at bedtime.   11 06/04/2018 at 2000  . Cholecalciferol (VITAMIN D) 125 MCG (5000 UT) CAPS Take 5,000 Units by mouth daily.    06/04/2018 at 0800  . glipiZIDE (GLUCOTROL) 10 MG tablet Take 10 mg by mouth 2 (two) times daily.   3 06/04/2018 at 1800  . lisinopril (PRINIVIL,ZESTRIL) 40 MG tablet Take 40 mg by mouth daily.    06/04/2018 at 0800  . metFORMIN (GLUCOPHAGE) 500 MG tablet Take 500 mg by mouth 2 (two) times daily with a meal.    06/04/2018 at 1800    Current medications: Current Facility-Administered Medications  Medication Dose Route Frequency Provider Last Rate Last Dose  . 0.9 %  sodium chloride infusion  250  mL Intravenous PRN Auburn BilberryPatel, Shreyang, MD      . acetaminophen (TYLENOL) tablet 650 mg  650 mg Oral Q6H PRN Auburn BilberryPatel, Shreyang, MD   650 mg at 06/06/18 0441   Or  . acetaminophen (TYLENOL) suppository 650 mg  650 mg Rectal Q6H PRN Auburn BilberryPatel, Shreyang, MD      . aspirin EC tablet 81 mg  81 mg Oral Daily Auburn BilberryPatel, Shreyang, MD   81 mg at 06/06/18 0829  . atorvastatin (LIPITOR) tablet 20 mg  20 mg Oral q1800 Auburn BilberryPatel, Shreyang, MD   20 mg at 06/05/18 1756  . enoxaparin (LOVENOX) injection 40 mg  40 mg Subcutaneous Q24H Auburn BilberryPatel, Shreyang, MD   40 mg at 06/05/18 2213  . hydrALAZINE (APRESOLINE) injection 10 mg  10 mg Intravenous Q6H PRN Auburn BilberryPatel, Shreyang, MD      . hydrALAZINE (APRESOLINE) tablet 100 mg  100 mg Oral Q8H Auburn BilberryPatel, Shreyang, MD   100 mg at 06/06/18 0535  . Influenza vac split quadrivalent PF (FLUARIX) injection 0.5 mL  0.5 mL Intramuscular Tomorrow-1000 Auburn BilberryPatel, Shreyang, MD      . insulin aspart (novoLOG) injection 0-9 Units  0-9 Units Subcutaneous TID WC Auburn BilberryPatel, Shreyang, MD  5 Units at 06/06/18 1230  . labetalol (NORMODYNE) tablet 300 mg  300 mg Oral BID Auburn Bilberry, MD   300 mg at 06/06/18 1029  . ondansetron (ZOFRAN) tablet 4 mg  4 mg Oral Q6H PRN Auburn Bilberry, MD       Or  . ondansetron [facility]) injection 4 mg  4 mg Intravenous Q6H PRN Auburn Bilberry, MD      . sodium chloride flush (NS) 0.9 % injection 3 mL  3 mL Intravenous Q12H Auburn Bilberry, MD   3 mL at 06/06/18 0825  . sodium chloride flush (NS) 0.9 % injection 3 mL  3 mL Intravenous PRN Auburn Bilberry, MD          Allergies: Allergies  Allergen Reactions  . Codeine Nausea Only      Past Medical History: Past Medical History:  Diagnosis Date  . Depression   . Diabetes mellitus without complication (HCC)   . Hyperlipidemia   . Hypertension   . Peripheral vascular disease [facility])      Past Surgical History: Past Surgical History:  Procedure Laterality Date  . EYE SURGERY    . LOWER EXTREMITY ANGIOGRAPHY Right 07/28/2017    Procedure: LOWER EXTREMITY ANGIOGRAPHY;  Surgeon: Annice Needy, MD;  Location: [facility] INVASIVE CV LAB;  Service: Cardiovascular;  Laterality: Right;  . WISDOM TOOTH EXTRACTION       Family History: Family History  Problem Relation Age of Onset  . Diabetes Maternal Grandmother   . Colon cancer Maternal Grandfather      Social History: Social History   Socioeconomic History  . Marital status: Married    Spouse name: Not on file  . Number of children: Not on file  . Years of education: Not on file  . Highest education level: Not on file  Occupational History  . Not on file  Social Needs  . Financial resource strain: Not on file  . Food insecurity:    Worry: Not on file    Inability: Not on file  . Transportation needs:    Medical: Not on file    Non-medical: Not on file  Tobacco Use  . Smoking status: Never Smoker  . Smokeless tobacco: Never Used  Substance and Sexual Activity  . Alcohol use: No    Frequency: Never  . Drug use: No  . Sexual activity: Not on file  Lifestyle  . Physical activity:    Days per week: Not on file    Minutes per session: Not on file  . Stress: Not on file  Relationships  . Social connections:    Talks on phone: Not on file    Gets together: Not on file    Attends religious service: Not on file    Active member of club or organization: Not on file    Attends meetings of clubs or organizations: Not on file    Relationship status: Not on file  . Intimate partner violence:    Fear of current or ex partner: Not on file    Emotionally abused: Not on file    Physically abused: Not on file    Forced sexual activity: Not on file  Other Topics Concern  . Not on file  Social History Narrative  . Not on file     Review of Systems: Review of Systems  Constitutional: Negative.  Negative for chills, diaphoresis, fever, malaise/fatigue and weight loss.  HENT: Negative.  Negative for congestion, ear discharge, ear pain, hearing loss,  nosebleeds, sinus  pain, sore throat and tinnitus.   Eyes: Negative.  Negative for blurred vision, double vision, photophobia, pain, discharge and redness.  Respiratory: Positive for shortness of breath. Negative for cough, hemoptysis, sputum production, wheezing and stridor.   Cardiovascular: Positive for leg swelling. Negative for chest pain, palpitations, orthopnea, claudication and PND.  Gastrointestinal: Negative.  Negative for abdominal pain, blood in stool, constipation, diarrhea, heartburn, melena, nausea and vomiting.  Genitourinary: Negative.  Negative for dysuria, flank pain, frequency, hematuria and urgency.  Musculoskeletal: Negative.  Negative for back pain, joint pain, myalgias and neck pain.  Skin: Negative.  Negative for itching and rash.  Neurological: Negative.  Negative for dizziness, tingling, tremors, sensory change, speech change, focal weakness, seizures, loss of consciousness, weakness and headaches.  Endo/Heme/Allergies: Negative.  Negative for environmental allergies and polydipsia. Does not bruise/bleed easily.  Psychiatric/Behavioral: Negative.  Negative for depression, hallucinations, memory loss, substance abuse and suicidal ideas. The patient is not nervous/anxious and does not have insomnia.     Vital Signs: Blood pressure 128/63, pulse 86, temperature 98.8 F (37.1 C), temperature source Oral, resp. rate 18, height 6' (1.829 m), weight 112.8 kg, SpO2 98 %.  Weight trends: Filed Weights   06/05/18 1333 06/05/18 1735 06/06/18 0436  Weight: 108 kg 111.5 kg 112.8 kg    Physical Exam: General: NAD,   Head: Normocephalic, atraumatic. Moist oral mucosal membranes  Eyes: Anicteric, PERRL  Neck: Supple, trachea midline  Lungs:  Clear to auscultation  Heart: Regular rate and rhythm  Abdomen:  Soft, nontender,   Extremities:  + peripheral edema. Bilateral compression dressings  Neurologic: Nonfocal, moving all four extremities  Skin: No lesions         Lab  results: Basic Metabolic Panel: Recent Labs  Lab 06/05/18 1337 06/05/18 1727 06/06/18 0519  NA 138  --  138  K 4.0  --  3.4*  CL 106  --  105  CO2 26  --  24  GLUCOSE 215*  --  190*  BUN 18  --  25*  CREATININE 1.62* 1.66* 2.58*  CALCIUM 8.8*  --  8.0*    Liver Function Tests: Recent Labs  Lab 06/05/18 1337  AST 19  ALT 18  ALKPHOS 91  BILITOT 0.7  PROT 6.9  ALBUMIN 3.0*   No results for input(s): LIPASE, AMYLASE in the last 168 hours. No results for input(s): AMMONIA in the last 168 hours.  CBC: Recent Labs  Lab 06/05/18 1337 06/05/18 1727 06/06/18 0519  WBC 9.4 10.5 9.2  HGB 10.9* 10.3* 8.5*  HCT 34.2* 32.3* 27.2*  MCV 83.6 81.8 82.9  PLT 322 326 287    Cardiac Enzymes: Recent Labs  Lab 06/05/18 1337 06/05/18 1727 06/05/18 2306 06/06/18 0519  TROPONINI <0.03 0.03* 0.04* 0.19*    BNP: Invalid input(s): POCBNP  CBG: Recent Labs  Lab 06/05/18 1726 06/05/18 2117 06/06/18 0736 06/06/18 1217  GLUCAP 183* 231* 137* 256*    Microbiology: Results for orders placed or performed during the hospital encounter of 07/13/17  Aerobic Culture (superficial specimen)     Status: None   Collection Time: 07/13/17 10:30 AM  Result Value Ref Range Status   Specimen Description   Final    LEG Performed at [facility], [address], [place], [place] [address]    Special Requests   Final    RT LOWER Performed at [facility], [address] [place], [place] [address]    Gram Stain   Final  MODERATE WBC PRESENT,BOTH PMN AND MONONUCLEAR NO ORGANISMS SEEN    Culture   Final    NO GROWTH 2 DAYS Performed at [facility], [address] [place], [place] [address]    Report Status 07/16/2017 FINAL  Final  Aerobic Culture (superficial specimen)     Status: None   Collection Time: 07/13/17 10:30 AM  Result Value Ref Range Status   Specimen Description   Final    FOOT Performed at [facility], [address] [place], [place] [address]    Special Requests   Final    RT DISTAL Performed at [facility], [address] [place], [place] [address]    Gram Stain   Final    RARE WBC PRESENT,BOTH PMN AND MONONUCLEAR NO ORGANISMS SEEN    Culture   Final    NO GROWTH 2 DAYS Performed at [facility], [address] [place], [place] [address]    Report Status 07/16/2017 FINAL  Final    Coagulation Studies: No results for input(s): LABPROT, INR in the last 72 hours.  Urinalysis: Recent Labs    06/05/18 2343  COLORURINE YELLOW*  LABSPEC 1.010  PHURINE 6.0  GLUCOSEU 50*  HGBUR NEGATIVE  BILIRUBINUR NEGATIVE  KETONESUR NEGATIVE  PROTEINUR 100*  NITRITE NEGATIVE  LEUKOCYTESUR NEGATIVE      Imaging: [place] Renal  Result Date: 06/05/2018 CLINICAL DATA:  Acute renal failure EXAM: RENAL / URINARY TRACT ULTRASOUND COMPLETE COMPARISON:  None. FINDINGS: Right Kidney: Renal measurements: 10.9 x 5.7 x 6.1 cm = volume: 199 mL. The renal cortical echotexture exceeds that of the adjacent liver. There is no hydronephrosis nor cystic or solid mass demonstrated. Left Kidney: Renal measurements: 11.8 x 6.4 x 6.2 cm = volume: 244 mL. The cortical echotexture is increased similar to that on the right. There is no cystic or solid mass nor hydronephrosis. Bladder: Appears normal for degree of bladder distention. Bilateral ureteral jets are observed. IMPRESSION: Increased renal cortical echotexture bilaterally consistent with medical renal disease. No suspicious masses. No hydronephrosis. Bilateral ureteral jets are observed in the bladder. Electronically Signed   By: David  Swazil[place]Jordan M.D.   On: 06/05/2018 15:56   Dg Chest Portable 1 View  Result Date: 06/05/2018 CLINICAL DATA:  Cough, wheezing, and chest congestion which began at 2 a.m. this morning. Recent fever with vomiting and diarrhea lasting 1 day last week. History of diabetes, hypertension. EXAM: PORTABLE CHEST 1 VIEW COMPARISON:  None.  FINDINGS: The lungs are adequately inflated. There is no focal infiltrate. There is no pleural effusion. The right hemidiaphragm is mildly elevated as compared to the left. The heart and pulmonary vascularity are normal. The mediastinum is normal in width. The bony thorax exhibits no acute abnormality. IMPRESSION: There is no pneumonia nor other acute cardiopulmonary abnormality. Mild elevation of the right hemidiaphragm may be normal for the patient, but right hemidiaphragm dysfunction is not excluded. I have no previous studies with which to compare. Electronically Signed   By: David  [place]Jordan M.D.   On: 06/05/2018 13:50      Assessment & Plan: Ryan Leonard is a 42 y.o. black male with diabetes mellitus type II, hypertension, hyperlipidemia, peripheral vascular disease, depression, who was admitted to [facility] on 06/05/2018 for Hypoxia [R09.02] Acute renal failure (ARF) (HCC) [N17.9] Hypertensive urgency [I16.0] Dyspnea, unspecified type [R06.00]  1. 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  Baseline creatinine of 1.09, GFR >60 on 07/27/17.  Diabetic nephropathy on urinalysis and ultrasound Acute renal failure from congestive heart failure/acute cardiorenal syndrome.  Now holding diuretics Holding lisinopril  2. Hypertension with edema: echocardiogram pending - Discontinued amlodipine due to edema - most likely will need to be discharged with a diuretic.   3. Diabetes mellitus type II with renal manifestations. Hemoglobin A1c 8.1%. Not at goal.  - Continue glucose control.   LOS: 1 Ryan Leonard 12/31/201912:58 PM

## 2018-06-06 NOTE — Progress Notes (Signed)
*  PRELIMINARY RESULTS* Echocardiogram 2D Echocardiogram has been performed.  Cristela BlueHege, Lebron Nauert 06/06/2018, 1:59 PM

## 2018-06-06 NOTE — Consult Note (Signed)
WOC Nurse wound consult note Reason for Consult:Nonhealing wound to right medial malleolus.  Chronic scabbed dry areas in bilateral gaiter area of legs. History of PVD and revascularization.  Would like to address edema today with modified light compression and if this helps, recommend removable compression garment after discharge.   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  CHF exacerbation Pressure Injury POA: NA Measurement: 2 cm x 4 cm x 0.2 cm with 25% fibrin slough to wound bed.  Wound bed:see above  Pale pink nongranulating Drainage (amount, consistency, odor) minimal serosanguinous  Periwound:Dry skin, chronic skin changes Dressing procedure/placement/frequency: Cleanse bilateral lower legs with soap and water and pat dry  Wrap with zinc layer and secure with self adherent coban.  Remove before discharge.  IF this aids in edema management, recommend outpatient fitting for removable compression garment.  (Light compression, mixed venous and arterial disease) Will not follow at this time.  Please re-consult if needed.  Ryan HudsonKaren Jamilee Lafosse MSN, RN, FNP-BC CWON Wound, Ostomy, Continence Nurse Pager [phone]

## 2018-06-06 NOTE — Progress Notes (Addendum)
[employer] [facility] [facility] [place] [facility]   PATIENT NAME: Ryan ChurchBrian Leonard    MR#:  [id number]  DATE OF BIRTH:  Nov 30, 1975  SUBJECTIVE:  CHIEF COMPLAINT:   Chief Complaint  Patient presents with  . Hypertension  . Shortness of Breath   The patient feels dizzy while standing and the generalized weakness. REVIEW OF SYSTEMS:  Review of Systems  Constitutional: Positive for malaise/fatigue. Negative for chills and fever.  HENT: Negative for sore throat.   Eyes: Negative for blurred vision and double vision.  Respiratory: Negative for cough, hemoptysis, shortness of breath, wheezing and stridor.   Cardiovascular: Positive for leg swelling. Negative for chest pain, palpitations and orthopnea.  Gastrointestinal: Negative for abdominal pain, blood in stool, diarrhea, melena, nausea and vomiting.  Genitourinary: Negative for dysuria, flank pain and hematuria.  Musculoskeletal: Negative for back pain and joint pain.  Skin: Negative for rash.  Neurological: Positive for dizziness. Negative for sensory change, focal weakness, seizures, loss of consciousness, weakness and headaches.  Endo/Heme/Allergies: Negative for polydipsia.  Psychiatric/Behavioral: Negative for depression. The patient is not nervous/anxious.     DRUG ALLERGIES:   Allergies  Allergen Reactions  . Codeine Nausea Only   VITALS:  Blood pressure (!) 117/55, pulse 87, temperature 99.4 F (37.4 C), temperature source Oral, resp. rate 18, height 6' (1.829 m), weight 112.8 kg, SpO2 97 %. PHYSICAL EXAMINATION:  Physical Exam Constitutional:      General: He is not in acute distress.    Appearance: He is obese.  HENT:     Head: Normocephalic.     Nose: Nose normal.     Mouth/Throat:     Mouth: Mucous membranes are moist.  Eyes:     General: No scleral icterus.    Conjunctiva/sclera: Conjunctivae normal.     Pupils: Pupils are equal, round, and reactive to light.  Neck:     Musculoskeletal: Normal  range of motion and neck supple.     Vascular: No JVD.     Trachea: No tracheal deviation.  Cardiovascular:     Rate and Rhythm: Normal rate and regular rhythm.     Heart sounds: Normal heart sounds. No murmur. No gallop.   Pulmonary:     Effort: Pulmonary effort is normal. No respiratory distress.     Breath sounds: Normal breath sounds. No wheezing or rales.  Abdominal:     General: Bowel sounds are normal. There is no distension.     Palpations: Abdomen is soft.     Tenderness: There is no abdominal tenderness. There is no rebound.  Musculoskeletal: Normal range of motion.        General: No tenderness.     Comments: Bilateral leg in dressing.  Skin:    Findings: No erythema or rash.  Neurological:     General: No focal deficit present.     Mental Status: He is alert and oriented to person, place, and time.     Cranial Nerves: No cranial nerve deficit.  Psychiatric:        Mood and Affect: Mood normal.    LABORATORY PANEL:  Male CBC Recent Labs  Lab 06/06/18 0519  WBC 9.2  HGB 8.5*  HCT 27.2*  PLT 287   ------------------------------------------------------------------------------------------------------------------ Chemistries  Recent Labs  Lab 06/05/18 1337  06/06/18 0519  NA 138  --  138  K 4.0  --  3.4*  CL 106  --  105  CO2 26  --  24  GLUCOSE 215*  --  190*  BUN 18  --  25*  CREATININE 1.62*   < > 2.58*  CALCIUM 8.8*  --  8.0*  AST 19  --   --   ALT 18  --   --   ALKPHOS 91  --   --   BILITOT 0.7  --   --    < > = values in this interval not displayed.   RADIOLOGY:  No results found. ASSESSMENT AND PLAN:   Patient is 42 year old with history of hypertension diabetes hyperlipidemia questionable medical compliance presenting with shortness of breath accelerated hypertension  1.  Accelerated hypertension, controlled. Decrease hydralazine and labetalol doses due to dizziness and much lower blood pressure than his baseline. Hold lisinopril in  light of elevated creatinine Discontinued Norvasc in light of significant lower extremity edema IV hydralazine PRN  2.  Shortness of breath chest x-ray is negative He was given IV Lasix  echocardiogram is pending.  3.  Acute renal failure suspect likely has significant proteinuria Hold lisinopril and Lasix due to worsening renal function.  Follow-up BMP.  No IV fluids due to bilateral leg swelling and shortness of breath.  4.  Right lower extremity ulcer  Continue wound care.  5.  Diabetes type 2  Hold metformin and invonka, continue sliding scale, hemoglobin A1c 8.1.  Elevated troponin.  In cardiac etiology, possible related to acute renal failure.  The patient has no chest pain no palpitation. Continue aspirin and Lipitor, follow-up lipid panel and cardiology consult.  Hypokalemia.  Give potassium supplement.  Anemia of chronic disease.  Hemoglobin decreased to 8.5.  No active bleeding follow-up hemoglobin.  PVD, atherosclerosis disease.  Continue aspirin and Lipitor, follow-up Dr. dew as outpatient.  I discussed with Dr. Wynelle LinkKolluru.  All the records are reviewed and case discussed with Care Management/Social Worker. Management plans discussed with the patient, his wife and they are in agreement.  CODE STATUS: Full Code  TOTAL TIME TAKING CARE OF THIS PATIENT: 33 minutes.   More than 50% of the time was spent in counseling/coordination of care: YES  POSSIBLE D/C IN 2 DAYS, DEPENDING ON CLINICAL CONDITION.   Shaune PollackQing Yvonnie Schinke M.D on 06/06/2018 at 4:44 PM  Between 7am to 6pm - Pager - [phone]  After 6pm go to [email or web address] - Therapist, nutritionalpassword [email or web address]  [facility] [place] [facility]

## 2018-06-07 LAB — BASIC METABOLIC PANEL
Anion gap: 6 (ref 5–15)
BUN: 26 mg/dL — ABNORMAL HIGH (ref 6–20)
CHLORIDE: 107 mmol/L (ref 98–111)
CO2: 25 mmol/L (ref 22–32)
Calcium: 7.9 mg/dL — ABNORMAL LOW (ref 8.9–10.3)
Creatinine, Ser: 2.71 mg/dL — ABNORMAL HIGH (ref 0.61–1.24)
GFR calc non Af Amer: 27 mL/min — ABNORMAL LOW (ref >60)
GFR, EST AFRICAN AMERICAN: 32 mL/min — AB (ref >60)
Glucose, Bld: 133 mg/dL — ABNORMAL HIGH (ref 70–99)
Potassium: 3.5 mmol/L (ref 3.5–5.1)
Sodium: 138 mmol/L (ref 135–145)

## 2018-06-07 LAB — CBC
HEMATOCRIT: 27 % — AB (ref 39.0–52.0)
Hemoglobin: 8.7 g/dL — ABNORMAL LOW (ref 13.0–17.0)
MCH: 26.4 pg (ref 26.0–34.0)
MCHC: 32.2 g/dL (ref 30.0–36.0)
MCV: 81.8 fL (ref 80.0–100.0)
Platelets: 267 10*3/uL (ref 150–400)
RBC: 3.3 MIL/uL — ABNORMAL LOW (ref 4.22–5.81)
RDW: 14.6 % (ref 11.5–15.5)
WBC: 8.2 10*3/uL (ref 4.0–10.5)
nRBC: 0 % (ref 0.0–0.2)

## 2018-06-07 LAB — GLUCOSE, CAPILLARY
Glucose-Capillary: 112 mg/dL — ABNORMAL HIGH (ref 70–99)
Glucose-Capillary: 213 mg/dL — ABNORMAL HIGH (ref 70–99)
Glucose-Capillary: 192 mg/dL — ABNORMAL HIGH (ref 70–99)
Glucose-Capillary: 146 mg/dL — ABNORMAL HIGH (ref 70–99)

## 2018-06-07 LAB — ECHOCARDIOGRAM COMPLETE
Height: 72 in
Weight: 3977.6 oz

## 2018-06-07 LAB — HIV ANTIBODY (ROUTINE TESTING W REFLEX): HIV Screen 4th Generation wRfx: NONREACTIVE

## 2018-06-07 LAB — PROTEIN / CREATININE RATIO, URINE
Creatinine, Urine: 368 mg/dL
Protein Creatinine Ratio: 0.98 mg/mg{Cre} — ABNORMAL HIGH (ref 0.00–0.15)
Total Protein, Urine: 359 mg/dL

## 2018-06-07 LAB — AMMONIA: Ammonia: 21 umol/L (ref 9–35)

## 2018-06-07 LAB — MAGNESIUM: Magnesium: 2.1 mg/dL (ref 1.7–2.4)

## 2018-06-07 LAB — TROPONIN I: Troponin I: 0.54 ng/mL (ref <0.03)

## 2018-06-07 MED ORDER — VANCOMYCIN HCL 10 G IV SOLR
1500.0000 mg | INTRAVENOUS | Status: DC
  Administered 2018-06-07 – 2018-06-08 (×2): 1500 mg via INTRAVENOUS
  Filled 2018-06-07 (×3): qty 1500

## 2018-06-07 MED ORDER — PIPERACILLIN-TAZOBACTAM 3.375 G IVPB
3.3750 g | Freq: Three times a day (TID) | INTRAVENOUS | Status: DC
  Administered 2018-06-07 – 2018-06-09 (×6): 3.375 g via INTRAVENOUS
  Filled 2018-06-07 (×6): qty 50

## 2018-06-07 MED ORDER — SODIUM CHLORIDE 0.9 % IV SOLN
INTRAVENOUS | Status: AC
  Administered 2018-06-07 – 2018-06-08 (×2): via INTRAVENOUS

## 2018-06-07 MED ORDER — VANCOMYCIN HCL IN DEXTROSE 1-5 GM/200ML-% IV SOLN
1000.0000 mg | Freq: Once | INTRAVENOUS | Status: AC
  Administered 2018-06-07: 1000 mg via INTRAVENOUS
  Filled 2018-06-07: qty 200

## 2018-06-07 NOTE — Plan of Care (Signed)
  Problem: Education: Goal: Ability to demonstrate management of disease process will improve Outcome: Progressing Goal: Ability to verbalize understanding of medication therapies will improve Outcome: Progressing   Problem: Activity: Goal: Capacity to carry out activities will improve Outcome: Progressing   

## 2018-06-07 NOTE — Progress Notes (Signed)
Patient more lethargic this evening- wife concerned about him not being able to stay awake. Patient is oriented x4 but is in and out of alertness. Vital signs WDL no complaints of pain or shortness of breath. Dr. Tobi Bastos who is on call notified. Per Dr. Judd Gaudier he will look into the chart to put in any orders. Will continue to monitor patient and await for new orders.

## 2018-06-07 NOTE — Progress Notes (Signed)
[facility]  Patient: Ryan Leonard / Admit Date: 06/05/2018 / Date of Encounter: 06/07/2018, 6:55 AM   Subjective: Patient still weak fatigued and sleepy today.  No evidence of apparent hypotension and or chest pain or heart failure type symptoms at this time.  Elevated troponin to 0.25 most consistent with demand ischemia as well as chronic kidney disease and anemia.  Patient had echocardiogram showing severe left ventricular hypertrophy and diastolic dysfunction with ejection fraction of 70%.  Review of Systems: Positive for: Sleepiness weakness fatigue Negative for: Vision change, hearing change, syncope, dizziness, nausea, vomiting,diarrhea, bloody stool, stomach pain, cough, congestion, diaphoresis, urinary frequency, urinary pain,skin lesions, skin rashes Others previously listed  Objective: Telemetry: Normal sinus rhythm Physical Exam: Blood pressure (!) 148/77, pulse 90, temperature 98.7 F (37.1 C), temperature source Oral, resp. rate 16, height 6' (1.829 m), weight 113 kg, SpO2 95 %. Body mass index is 33.8 kg/m. General: Well developed, well nourished, in no acute distress. Head: Normocephalic, atraumatic, sclera non-icteric, no xanthomas, nares are without discharge. Neck: No apparent masses Lungs: Normal respirations with no wheezes, no rhonchi, no rales , no crackles   Heart: Regular rate and rhythm, normal S1 S2, right upper sternal border murmur, no rub, no gallop, PMI is normal size and placement, carotid upstroke normal without bruit, jugular venous pressure normal Abdomen: Soft, non-tender, non-distended with normoactive bowel sounds. No hepatosplenomegaly. Abdominal aorta is normal size without bruit Extremities: 1+ edema, no clubbing, no cyanosis positive ulcers,  Peripheral: 2+ radial, 2+ femoral, 2+ dorsal pedal pulses Neuro: Alert and oriented. Moves all extremities spontaneously. Psych:  Responds to questions appropriately  with a normal affect.   Intake/Output Summary (Last 24 hours) at 06/07/2018 0655 Last data filed at 06/07/2018 0423 Gross per 24 hour  Intake 120 ml  Output 550 ml  Net -430 ml    Inpatient Medications:  . aspirin EC  81 mg Oral Daily  . atorvastatin  40 mg Oral q1800  . heparin injection (subcutaneous)  5,000 Units Subcutaneous Q8H  . hydrALAZINE  50 mg Oral Q8H  . Influenza vac split quadrivalent PF  0.5 mL Intramuscular Tomorrow-1000  . insulin aspart  0-9 Units Subcutaneous TID WC  . labetalol  100 mg Oral BID  . sodium chloride flush  3 mL Intravenous Q12H   Infusions:  . sodium chloride      Labs: Recent Labs    06/06/18 0519 06/07/18 0417  NA 138 138  K 3.4* 3.5  CL 105 107  CO2 24 25  GLUCOSE 190* 133*  BUN 25* 26*  CREATININE 2.58* 2.71*  CALCIUM 8.0* 7.9*  MG  --  2.1   Recent Labs    06/05/18 1337  AST 19  ALT 18  ALKPHOS 91  BILITOT 0.7  PROT 6.9  ALBUMIN 3.0*   Recent Labs    06/06/18 0519 06/07/18 0417  WBC 9.2 8.2  HGB 8.5* 8.7*  HCT 27.2* 27.0*  MCV 82.9 81.8  PLT 287 267   Recent Labs    06/05/18 1727 06/05/18 2306 06/06/18 0519 06/06/18 1632  TROPONINI 0.03* 0.04* 0.19* 0.25*   Invalid input(s): POCBNP Recent Labs    06/05/18 1548  HGBA1C 8.1*     Weights: Filed Weights   06/05/18 1735 06/06/18 0436 06/07/18 0423  Weight: 111.5 kg 112.8 kg 113 kg     Radiology/Studies:  [place] Renal  Result Date: 06/05/2018 CLINICAL DATA:  Acute renal failure EXAM: RENAL / URINARY TRACT ULTRASOUND  COMPLETE COMPARISON:  None. FINDINGS: Right Kidney: Renal measurements: 10.9 x 5.7 x 6.1 cm = volume: 199 mL. The renal cortical echotexture exceeds that of the adjacent liver. There is no hydronephrosis nor cystic or solid mass demonstrated. Left Kidney: Renal measurements: 11.8 x 6.4 x 6.2 cm = volume: 244 mL. The cortical echotexture is increased similar to that on the right. There is no cystic or solid mass nor hydronephrosis. Bladder:  Appears normal for degree of bladder distention. Bilateral ureteral jets are observed. IMPRESSION: Increased renal cortical echotexture bilaterally consistent with medical renal disease. No suspicious masses. No hydronephrosis. Bilateral ureteral jets are observed in the bladder. Electronically Signed   By: David  [place] M.D.   On: 06/05/2018 15:56   Dg Chest Portable 1 View  Result Date: 06/05/2018 CLINICAL DATA:  Cough, wheezing, and chest congestion which began at 2 a.m. this morning. Recent fever with vomiting and diarrhea lasting 1 day last week. History of diabetes, hypertension. EXAM: PORTABLE CHEST 1 VIEW COMPARISON:  None. FINDINGS: The lungs are adequately inflated. There is no focal infiltrate. There is no pleural effusion. The right hemidiaphragm is mildly elevated as compared to the left. The heart and pulmonary vascularity are normal. The mediastinum is normal in width. The bony thorax exhibits no acute abnormality. IMPRESSION: There is no pneumonia nor other acute cardiopulmonary abnormality. Mild elevation of the right hemidiaphragm may be normal for the patient, but right hemidiaphragm dysfunction is not excluded. I have no previous studies with which to compare. Electronically Signed   By: David  [place] M.D.   On: 06/05/2018 13:50     Assessment and Recommendation  43 y.o. male with diabetes with complications of peripheral vascular disease hyperlipidemia hypertension chronic kidney disease stage III anemia with acute shortness of breath multifactorial in nature including diastolic dysfunction elevated troponin consistent with demand ischemia rather than acute coronary syndrome slightly improved today 1.  Continue hypertension control with appropriate medication management including beta-blocker for further risk reduction of left ventricular hypertrophy and diastolic dysfunction 2.  Continue treatment of anemia and chronic kidney disease as able 3.  No further cardiac diagnostics  necessary at this time 4.  High intensity cholesterol therapy 5.  Begin ambulation and follow for improvements of symptoms and possible discharged home if able with follow-up from cardiology standpoint in 1 to 2 weeks for further adjustments of medication management  Signed, Arnoldo Hooker M.D. FACC

## 2018-06-07 NOTE — Consult Note (Addendum)
Pharmacy Antibiotic Note  Ryan Leonard is a 43 y.o. male admitted on 06/05/2018 with SOB. He now has a fever of unknown etiology and is being started on broad spectrum antibiotics. Pharmacy has been consulted for Zosyn and vancomycin dosing. His SCr is well above his baseline of 1.09 mg/dL.  Plan: 1) vancomycin 1000mg  once then 6 hours later 1500mg  every 24 hours  -Ke: 0.05h-1  -Vd: 79L  -T1/2: 13.9h  -Css: 35.3/11.2 mcg/mL  -Vt: will order prior to 4th dose but I expect the dose will need  to be re-adjusted as his Scr returns to baseline  2) Zosyn 3.375g IV q8h (4 hour infusion).  Height: 6' (182.9 cm) Weight: 249 lb 3.2 oz (113 kg) IBW/kg (Calculated) : 77.6  Temp (24hrs), Avg:99.2 F (37.3 C), Min:98.2 F (36.8 C), Max:101.2 F (38.4 C)  Recent Labs  Lab 06/05/18 1337 06/05/18 1727 06/06/18 0519 06/07/18 0417  WBC 9.4 10.5 9.2 8.2  CREATININE 1.62* 1.66* 2.58* 2.71*    Estimated Creatinine Clearance: 45.2 mL/min (A) (by C-G formula based on SCr of 2.71 mg/dL (H)).    Allergies  Allergen Reactions  . Codeine Nausea Only    Antimicrobials this admission: Zosyn 1/1 >>  Vancomycin 1/1 >>  Microbiology results: 1/1 BCx: pending  Thank you for allowing pharmacy to be a part of this patient's care.  Lowella Bandy, PharmD 06/07/2018 8:06 AM

## 2018-06-07 NOTE — Progress Notes (Addendum)
[facility] [place] [facility]   PATIENT NAME: Ryan Leonard    MR#:  [id number]  DATE OF BIRTH:  [birth year]  SUBJECTIVE:  CHIEF COMPLAINT:   Chief Complaint  Patient presents with  . Hypertension  . Shortness of Breath   The patient has generalized weakness and poor oral intake. REVIEW OF SYSTEMS:  Review of Systems  Constitutional: Positive for malaise/fatigue. Negative for chills and fever.  HENT: Negative for sore throat.   Eyes: Negative for blurred vision and double vision.  Respiratory: Negative for cough, hemoptysis, shortness of breath, wheezing and stridor.   Cardiovascular: Positive for leg swelling. Negative for chest pain, palpitations and orthopnea.  Gastrointestinal: Negative for abdominal pain, blood in stool, diarrhea, melena, nausea and vomiting.  Genitourinary: Negative for dysuria, flank pain and hematuria.  Musculoskeletal: Negative for back pain and joint pain.  Skin: Negative for rash.  Neurological: Negative for dizziness, sensory change, focal weakness, seizures, loss of consciousness, weakness and headaches.  Endo/Heme/Allergies: Negative for polydipsia.  Psychiatric/Behavioral: Negative for depression. The patient is not nervous/anxious.     DRUG ALLERGIES:   Allergies  Allergen Reactions  . Codeine Nausea Only   VITALS:  Blood pressure (!) 142/74, pulse 75, temperature 98.2 F (36.8 C), temperature source Oral, resp. rate 16, height 6' (1.829 m), weight 113 kg, SpO2 96 %. PHYSICAL EXAMINATION:  Physical Exam Constitutional:      General: He is not in acute distress.    Appearance: He is obese.  HENT:     Head: Normocephalic.     Nose: Nose normal.     Mouth/Throat:     Mouth: Mucous membranes are moist.  Eyes:     General: No scleral icterus.    Conjunctiva/sclera: Conjunctivae normal.     Pupils: Pupils are equal, round, and reactive to light.  Neck:     Musculoskeletal: Normal range of motion and neck  supple.     Vascular: No JVD.     Trachea: No tracheal deviation.  Cardiovascular:     Rate and Rhythm: Normal rate and regular rhythm.     Heart sounds: Normal heart sounds. No murmur. No gallop.   Pulmonary:     Effort: Pulmonary effort is normal. No respiratory distress.     Breath sounds: Normal breath sounds. No wheezing or rales.  Abdominal:     General: Bowel sounds are normal. There is no distension.     Palpations: Abdomen is soft.     Tenderness: There is no abdominal tenderness. There is no rebound.  Musculoskeletal: Normal range of motion.        General: No tenderness.     Comments: Bilateral leg in dressing.  Skin:    Findings: No erythema or rash.  Neurological:     General: No focal deficit present.     Mental Status: He is alert and oriented to person, place, and time.     Cranial Nerves: No cranial nerve deficit.  Psychiatric:        Mood and Affect: Mood normal.    LABORATORY PANEL:  Male CBC Recent Labs  Lab 06/07/18 0417  WBC 8.2  HGB 8.7*  HCT 27.0*  PLT 267   ------------------------------------------------------------------------------------------------------------------ Chemistries  Recent Labs  Lab 06/05/18 1337  06/07/18 0417  NA 138   < > 138  K 4.0   < > 3.5  CL 106   < > 107  CO2 26   < > 25  GLUCOSE 215*   < >  133*  BUN 18   < > 26*  CREATININE 1.62*   < > 2.71*  CALCIUM 8.8*   < > 7.9*  MG  --   --  2.1  AST 19  --   --   ALT 18  --   --   ALKPHOS 91  --   --   BILITOT 0.7  --   --    < > = values in this interval not displayed.   RADIOLOGY:  No results found. ASSESSMENT AND PLAN:   Patient is 43 year old with history of hypertension diabetes hyperlipidemia questionable medical compliance presenting with shortness of breath accelerated hypertension  1.  Accelerated hypertension, controlled. Decreased hydralazine and labetalol doses due to dizziness and much lower blood pressure than his baseline. Hold lisinopril in  light of elevated creatinine Discontinued Norvasc in light of significant lower extremity edema IV hydralazine PRN  2.  Shortness of breath chest x-ray is negative He was given IV Lasix  echocardiogram: LV EF: 65% -   70%, unremarkable.  3.  Acute renal failure suspect likely has significant proteinuria. Worsening. Hold lisinopril and Lasix due to worsening renal function.  Start IV fluids and follow-up BMP.  4.  Right lower extremity ulcer  Continue wound care.  5.  Diabetes type 2  Hold metformin and invonka, continue sliding scale, hemoglobin A1c 8.1.  Elevated troponin.  In cardiac etiology, possible related to acute renal failure.  The patient has no chest pain no palpitation. Continue aspirin and Lipitor. Per Dr. Gwen PoundsKowalski, No further cardiac diagnostics necessary at this time.  Hypokalemia.  Improved with potassium supplement.  Anemia of chronic disease.  Hemoglobin decreased to 8.5.  No active bleeding, follow-up hemoglobin is a stable..  PVD, atherosclerosis disease.  Continue aspirin and Lipitor, follow-up Dr. dew as outpatient.  Fever of unknown etiology. Chest x-ray is unremarkable, urinalysis is unremarkable. Started Zosyn and vancomycin pharmacy to dose. Follow-up blood culture.  I discussed with Dr. Wynelle LinkKolluru.  All the records are reviewed and case discussed with Care Management/Social Worker. Management plans discussed with the patient, his wife and they are in agreement.  CODE STATUS: Full Code  TOTAL TIME TAKING CARE OF THIS PATIENT: 33 minutes.   More than 50% of the time was spent in counseling/coordination of care: YES  POSSIBLE D/C IN 2 DAYS, DEPENDING ON CLINICAL CONDITION.   Shaune PollackQing Keilen Kahl M.D on 06/07/2018 at 2:44 PM  Between 7am to 6pm - Pager - [phone]  After 6pm go to [email or web address] - Therapist, nutritionalpassword [email or web address]  [facility] [place] [facility]

## 2018-06-07 NOTE — Progress Notes (Signed)
[facility]  ROUNDING NOTE   Subjective:   Wife at bedside.   Patient more lethargic this morning  Tmax 101.2  Started on pip/tazo and NS infusion this morning.   Objective:  Vital signs in last 24 hours:  Temp:  [98.2 F (36.8 C)-101.2 F (38.4 C)] 98.2 F (36.8 C) (01/01 0804) Pulse Rate:  [83-90] 83 (01/01 0804) Resp:  [16-18] 16 (01/01 0423) BP: (117-148)/(55-77) 133/73 (01/01 0804) SpO2:  [94 %-97 %] 96 % (01/01 0804) Weight:  [830 kg] 113 kg (01/01 0423)  Weight change: 5.036 kg Filed Weights   06/05/18 1735 06/06/18 0436 06/07/18 0423  Weight: 111.5 kg 112.8 kg 113 kg    Intake/Output: I/O last 3 completed shifts: In: 120 [P.O.:120] Out: 750 [Urine:750]   Intake/Output this shift:  Total I/O In: 243 [P.O.:240; I.V.:3] Out: 0   Physical Exam: General: NAD,   Head: Normocephalic, atraumatic. Moist oral mucosal membranes  Eyes: Anicteric, PERRL  Neck: Supple, trachea midline  Lungs:  Clear to auscultation  Heart: Regular rate and rhythm  Abdomen:  Soft, nontender, obese  Extremities: Bilateral compression dressings lower extremities, + peripheral edema.  Neurologic: Somnolent.   Skin: No lesions        Basic Metabolic Panel: Recent Labs  Lab 06/05/18 1337 06/05/18 1727 06/06/18 0519 06/07/18 0417  NA 138  --  138 138  K 4.0  --  3.4* 3.5  CL 106  --  105 107  CO2 26  --  24 25  GLUCOSE 215*  --  190* 133*  BUN 18  --  25* 26*  CREATININE 1.62* 1.66* 2.58* 2.71*  CALCIUM 8.8*  --  8.0* 7.9*  MG  --   --   --  2.1    Liver Function Tests: Recent Labs  Lab 06/05/18 1337  AST 19  ALT 18  ALKPHOS 91  BILITOT 0.7  PROT 6.9  ALBUMIN 3.0*   No results for input(s): LIPASE, AMYLASE in the last 168 hours. No results for input(s): AMMONIA in the last 168 hours.  CBC: Recent Labs  Lab 06/05/18 1337 06/05/18 1727 06/06/18 0519 06/07/18 0417  WBC 9.4 10.5 9.2 8.2  HGB 10.9* 10.3* 8.5* 8.7*  HCT 34.2* 32.3* 27.2* 27.0*   MCV 83.6 81.8 82.9 81.8  PLT 322 326 287 267    Cardiac Enzymes: Recent Labs  Lab 06/05/18 1727 06/05/18 2306 06/06/18 0519 06/06/18 1632 06/07/18 0826  TROPONINI 0.03* 0.04* 0.19* 0.25* 0.54*    BNP: Invalid input(s): POCBNP  CBG: Recent Labs  Lab 06/06/18 1217 06/06/18 1540 06/06/18 2057 06/07/18 0805 06/07/18 1139  GLUCAP 256* 207* 110* 112* 213*    Microbiology: Results for orders placed or performed during the hospital encounter of 07/13/17  Aerobic Culture (superficial specimen)     Status: None   Collection Time: 07/13/17 10:30 AM  Result Value Ref Range Status   Specimen Description   Final    LEG Performed at [facility], [address], [place], [place] [address]    Special Requests   Final    RT LOWER Performed at [facility], [address] [place], [place] [address]    Gram Stain   Final    MODERATE WBC PRESENT,BOTH PMN AND MONONUCLEAR NO ORGANISMS SEEN    Culture   Final    NO GROWTH 2 DAYS Performed at [facility], [address] [place], [place] [address]    Report Status 07/16/2017 FINAL  Final  Aerobic Culture (superficial specimen)     Status: None   Collection Time: 07/13/17 10:30 AM  Result Value Ref Range Status   Specimen Description   Final    FOOT Performed at [facility], [address] [place], [place] [address]    Special Requests   Final    RT DISTAL Performed at [facility], [address] [place], [place] [address]    Gram Stain   Final    RARE WBC PRESENT,BOTH PMN AND MONONUCLEAR NO ORGANISMS SEEN    Culture   Final    NO GROWTH 2 DAYS Performed at [facility], [address], [place], [place] [address]    Report Status 07/16/2017 FINAL  Final    Coagulation Studies: No results for input(s): LABPROT, INR in the last 72 hours.  Urinalysis: Recent Labs    06/05/18 2343  COLORURINE YELLOW*  LABSPEC 1.010  PHURINE 6.0  GLUCOSEU 50*  HGBUR  NEGATIVE  BILIRUBINUR NEGATIVE  KETONESUR NEGATIVE  PROTEINUR 100*  NITRITE NEGATIVE  LEUKOCYTESUR NEGATIVE      Imaging: US Renal  Result Date: 06/05/2018 CLINICAL DATA:  Acute renal failure EXAM: RENAL / URINARY TRACT ULTRASOUND COMPLETE COMPARISON:  None. FINDINGS: Right Kidney: Renal measurements: 10.9 x 5.7 x 6.1 cm = volume: 199 mL. The renal cortical echotexture exceeds that of the adjacent liver. There is no hydronephrosis nor cystic or solid mass demonstrated. Left Kidney: Renal measurements: 11.8 x 6.4 x 6.2 cm = volume: 244 mL. The cortical echotexture is increased similar to that on the right. There is no cystic or solid mass nor hydronephrosis. Bladder: Appears normal for degree of bladder distention. Bilateral ureteral jets are observed. IMPRESSION: Increased renal cortical echotexture bilaterally consistent with medical renal disease. No suspicious masses. No hydronephrosis. Bilateral ureteral jets are observed in the bladder. Electronically Signed   By: David  [place] M.D.   On: 06/05/2018 15:56   Dg Chest Portable 1 View  Result Date: 06/05/2018 CLINICAL DATA:  Cough, wheezing, and chest congestion which began at 2 a.m. this morning. Recent fever with vomiting and diarrhea lasting 1 day last week. History of diabetes, hypertension. EXAM: PORTABLE CHEST 1 VIEW COMPARISON:  None. FINDINGS: The lungs are adequately inflated. There is no focal infiltrate. There is no pleural effusion. The right hemidiaphragm is mildly elevated as compared to the left. The heart and pulmonary vascularity are normal. The mediastinum is normal in width. The bony thorax exhibits no acute abnormality. IMPRESSION: There is no pneumonia nor other acute cardiopulmonary abnormality. Mild elevation of the right hemidiaphragm may be normal for the patient, but right hemidiaphragm dysfunction is not excluded. I have no previous studies with which to compare. Electronically Signed   By: David  [place] M.D.   On:  06/05/2018 13:50     Medications:   . sodium chloride    . sodium chloride 100 mL/hr at 06/07/18 0919  . piperacillin-tazobactam (ZOSYN)  IV 3.375 g (06/07/18 0921)  . vancomycin    . vancomycin     . aspirin EC  81 mg Oral Daily  . atorvastatin  40 mg Oral q1800  . heparin injection (subcutaneous)  5,000 Units Subcutaneous Q8H  . hydrALAZINE  50 mg Oral Q8H  . insulin aspart  0-9 Units Subcutaneous TID WC  . labetalol  100 mg Oral BID  . sodium chloride flush  3 mL Intravenous Q12H   sodium chloride, acetaminophen **OR** acetaminophen, hydrALAZINE, ondansetron **OR** ondansetron (ZOFRAN) IV, sodium chloride  flush  Assessment/ Plan:  Mr. Ryan Leonard is a 43 y.o. black male with diabetes mellitus type II, hypertension, hyperlipidemia, peripheral vascular disease, depression, who was admitted to [facility] on 06/05/2018 for hypertension and peripheral edema.   1. 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  Baseline creatinine of 1.09, GFR >60 on 07/27/17.  Acute renal failure from congestive heart failure/acute cardiorenal syndrome. Concern for progression of disease.  Nonnephrotic range proteinuria.  Now holding diuretics Holding lisinopril - IV fluids: NS at 12100mL/hr - monitor volume status   2. Hypertension with edema: echocardiogram reviewed with patient and wife.  Blood pressure at goal this morning - Discontinued amlodipine due to edema - most likely will need to be discharged with a diuretic.   3. Diabetes mellitus type II with renal manifestations. Hemoglobin A1c 8.1%. Not at goal.  Complicated with diabetic foot ulcer.  - discussed that patient will need to follow up with endocrinology.    LOS: 2 Ryan Leonard 1/1/202012:09 PM

## 2018-06-08 ENCOUNTER — Inpatient Hospital Stay: Payer: Managed Care, Other (non HMO)

## 2018-06-08 DIAGNOSIS — R4182 Altered mental status, unspecified: Secondary | ICD-10-CM

## 2018-06-08 LAB — GLUCOSE, CAPILLARY
Glucose-Capillary: 147 mg/dL — ABNORMAL HIGH (ref 70–99)
Glucose-Capillary: 145 mg/dL — ABNORMAL HIGH (ref 70–99)
Glucose-Capillary: 179 mg/dL — ABNORMAL HIGH (ref 70–99)
Glucose-Capillary: 160 mg/dL — ABNORMAL HIGH (ref 70–99)
Glucose-Capillary: 137 mg/dL — ABNORMAL HIGH (ref 70–99)

## 2018-06-08 LAB — URINE DRUG SCREEN, QUALITATIVE (ARMC ONLY)
Amphetamines, Ur Screen: NOT DETECTED
BARBITURATES, UR SCREEN: NOT DETECTED
Benzodiazepine, Ur Scrn: NOT DETECTED
Cannabinoid 50 Ng, Ur ~~LOC~~: NOT DETECTED
Cocaine Metabolite,Ur ~~LOC~~: NOT DETECTED
MDMA (Ecstasy)Ur Screen: NOT DETECTED
Methadone Scn, Ur: NOT DETECTED
Opiate, Ur Screen: NOT DETECTED
Phencyclidine (PCP) Ur S: NOT DETECTED
Tricyclic, Ur Screen: NOT DETECTED

## 2018-06-08 LAB — TROPONIN I: Troponin I: 0.44 ng/mL (ref <0.03)

## 2018-06-08 LAB — BASIC METABOLIC PANEL
Anion gap: 7 (ref 5–15)
BUN: 26 mg/dL — ABNORMAL HIGH (ref 6–20)
CO2: 23 mmol/L (ref 22–32)
Calcium: 7.8 mg/dL — ABNORMAL LOW (ref 8.9–10.3)
Chloride: 107 mmol/L (ref 98–111)
Creatinine, Ser: 2.45 mg/dL — ABNORMAL HIGH (ref 0.61–1.24)
GFR calc Af Amer: 36 mL/min — ABNORMAL LOW (ref >60)
GFR calc non Af Amer: 31 mL/min — ABNORMAL LOW (ref >60)
Glucose, Bld: 145 mg/dL — ABNORMAL HIGH (ref 70–99)
Potassium: 3.6 mmol/L (ref 3.5–5.1)
Sodium: 137 mmol/L (ref 135–145)

## 2018-06-08 LAB — HEPATIC FUNCTION PANEL
ALT: 14 U/L (ref 0–44)
AST: 15 U/L (ref 15–41)
Albumin: 2.6 g/dL — ABNORMAL LOW (ref 3.5–5.0)
Alkaline Phosphatase: 69 U/L (ref 38–126)
Bilirubin, Direct: <0.1 mg/dL (ref 0.0–0.2)
Total Bilirubin: 0.7 mg/dL (ref 0.3–1.2)
Total Protein: 5.8 g/dL — ABNORMAL LOW (ref 6.5–8.1)

## 2018-06-08 LAB — PHOSPHORUS: Phosphorus: 4 mg/dL (ref 2.5–4.6)

## 2018-06-08 LAB — MAGNESIUM: Magnesium: 2 mg/dL (ref 1.7–2.4)

## 2018-06-08 LAB — AMMONIA: Ammonia: 28 umol/L (ref 9–35)

## 2018-06-08 LAB — FOLATE: Folate: 10.5 ng/mL (ref >5.9)

## 2018-06-08 LAB — VITAMIN B12: Vitamin B-12: 177 pg/mL — ABNORMAL LOW (ref 180–914)

## 2018-06-08 MED ORDER — HYDRALAZINE HCL 50 MG PO TABS
75.0000 mg | ORAL_TABLET | Freq: Three times a day (TID) | ORAL | Status: DC
  Administered 2018-06-08 – 2018-06-09 (×3): 75 mg via ORAL
  Filled 2018-06-08 (×3): qty 1

## 2018-06-08 MED ORDER — SODIUM CHLORIDE 0.9 % IV SOLN
INTRAVENOUS | Status: AC
  Administered 2018-06-08 – 2018-06-09 (×3): via INTRAVENOUS

## 2018-06-08 NOTE — Progress Notes (Signed)
[employer] - [place] [facility]   PATIENT NAME: Ryan ChurchBrian Leonard    MR#:  [id number]  DATE OF BIRTH:  Mar 10, 1976  SUBJECTIVE:  CHIEF COMPLAINT:   Chief Complaint  Patient presents with  . Hypertension  . Shortness of Breath   The patient has generalized weakness, better oral intake.  He was lethargic  last night.and got CAT scan of head which is unremarkable.  Ammonia level is normal. REVIEW OF SYSTEMS:  Review of Systems  Constitutional: Positive for malaise/fatigue. Negative for chills and fever.  HENT: Negative for sore throat.   Eyes: Negative for blurred vision and double vision.  Respiratory: Negative for cough, hemoptysis, shortness of breath, wheezing and stridor.   Cardiovascular: Negative for chest pain, palpitations and orthopnea.  Gastrointestinal: Negative for abdominal pain, blood in stool, diarrhea, melena, nausea and vomiting.  Genitourinary: Negative for dysuria, flank pain and hematuria.  Musculoskeletal: Negative for back pain and joint pain.  Skin: Negative for rash.  Neurological: Negative for dizziness, sensory change, focal weakness, seizures, loss of consciousness, weakness and headaches.  Endo/Heme/Allergies: Negative for polydipsia.  Psychiatric/Behavioral: Negative for depression. The patient is not nervous/anxious.     DRUG ALLERGIES:   Allergies  Allergen Reactions  . Codeine Nausea Only   VITALS:  Blood pressure (!) 152/80, pulse 82, temperature 98.1 F (36.7 C), resp. rate 20, height 6' (1.829 m), weight 113.5 kg, SpO2 94 %. PHYSICAL EXAMINATION:  Physical Exam Constitutional:      General: He is not in acute distress.    Appearance: He is obese.  HENT:     Head: Normocephalic.     Nose: Nose normal.     Mouth/Throat:     Mouth: Mucous membranes are moist.  Eyes:     General: No scleral icterus.    Conjunctiva/sclera: Conjunctivae normal.     Pupils: Pupils are equal, round, and reactive to light.  Neck:   Musculoskeletal: Normal range of motion and neck supple.     Vascular: No JVD.     Trachea: No tracheal deviation.  Cardiovascular:     Rate and Rhythm: Normal rate and regular rhythm.     Heart sounds: Normal heart sounds. No murmur. No gallop.   Pulmonary:     Effort: Pulmonary effort is normal. No respiratory distress.     Breath sounds: Normal breath sounds. No wheezing or rales.  Abdominal:     General: Bowel sounds are normal. There is no distension.     Palpations: Abdomen is soft.     Tenderness: There is no abdominal tenderness. There is no rebound.  Musculoskeletal: Normal range of motion.        General: No tenderness.     Comments: Bilateral leg in dressing.  Skin:    Findings: No erythema or rash.  Neurological:     General: No focal deficit present.     Mental Status: He is alert and oriented to person, place, and time.     Cranial Nerves: No cranial nerve deficit.  Psychiatric:        Mood and Affect: Mood normal.    LABORATORY PANEL:  Male CBC Recent Labs  Lab 06/07/18 0417  WBC 8.2  HGB 8.7*  HCT 27.0*  PLT 267   ------------------------------------------------------------------------------------------------------------------ Chemistries  Recent Labs  Lab 06/08/18 0429 06/08/18 0632  NA 137  --   K 3.6  --   CL 107  --   CO2 23  --   GLUCOSE 145*  --  BUN 26*  --   CREATININE 2.45*  --   CALCIUM 7.8*  --   MG  --  2.0  AST  --  15  ALT  --  14  ALKPHOS  --  69  BILITOT  --  0.7   RADIOLOGY:  Ct Head Wo Contrast  Result Date: 06/08/2018 CLINICAL DATA:  Altered mental status.  Hypertension. EXAM: CT HEAD WITHOUT CONTRAST TECHNIQUE: Contiguous axial images were obtained from the base of the skull through the vertex without intravenous contrast. COMPARISON:  None. FINDINGS: Brain: The ventricles are normal in size and configuration. There is no intracranial mass, hemorrhage, extra-axial fluid collection, or midline shift. The brain parenchyma  appears unremarkable. No acute infarct evident. Benign-appearing calcification is noted in the pineal region. Vascular: No hyperdense vessel. There is mild calcification in the distal vertebral artery on the left. Skull: Bony calvarium appears intact. Sinuses/Orbits: There is mild mucosal thickening in an ethmoid air cell on the right posteriorly. Other visualized paranasal sinuses are clear. Visualized orbits appear symmetric bilaterally. Other: Mastoid air cells are clear. IMPRESSION: No mass or hemorrhage. No acute infarct. Slight arterial vascular calcification. Mucosal thickening noted in a single posterior ethmoid air cell on the right. Electronically Signed   By: Bretta Bang III M.D.   On: 06/08/2018 08:04   ASSESSMENT AND PLAN:   Patient is 43 year old with history of hypertension diabetes hyperlipidemia questionable medical compliance presenting with shortness of breath accelerated hypertension  1.  Accelerated hypertension, Continue hydralazine and labetalol. Hold lisinopril in light of elevated creatinine Discontinued Norvasc in light of significant lower extremity edema IV hydralazine PRN  2.  Shortness of breath chest x-ray is negative He was given IV Lasix  echocardiogram: LV EF: 65% -   70%, unremarkable.  3.  Acute renal failure suspect likely has significant proteinuria. Worsening. Hold lisinopril and Lasix due to worsening renal function.   Continue IV fluids and follow-up BMP.  4.  Right lower extremity ulcer  Continue wound care.  5.  Diabetes type 2  Hold metformin and invonka, continue sliding scale, hemoglobin A1c 8.1.  Elevated troponin.  In cardiac etiology, possible related to acute renal failure.  The patient has no chest pain no palpitation. Continue aspirin and Lipitor. Per Dr. Gwen Pounds, No further cardiac diagnostics necessary at this time.  Hypokalemia.  Improved with potassium supplement.  Anemia of chronic disease.  Hemoglobin decreased to  8.5.  No active bleeding, follow-up hemoglobin is a stable..  PVD, atherosclerosis disease.  Continue aspirin and Lipitor, follow-up Dr. dew as outpatient.  Fever of unknown etiology. Chest x-ray is unremarkable, urinalysis is unremarkable. Started Zosyn and vancomycin pharmacy to dose. Follow-up blood culture.  Acute metabolic encephalopathy, possible due to renal failure. CT head is unremarkable. The patient is alert, awake and oriented.  Continue current medical management per neurology consult.  I discussed with Dr. Wynelle Link.  All the records are reviewed and case discussed with Care Management/Social Worker. Management plans discussed with the patient, his wife and they are in agreement.  CODE STATUS: Full Code  TOTAL TIME TAKING CARE OF THIS PATIENT: 33 minutes.   More than 50% of the time was spent in counseling/coordination of care: YES  POSSIBLE D/C IN 2 DAYS, DEPENDING ON CLINICAL CONDITION.   Shaune Pollack M.D on 06/08/2018 at 3:58 PM  Between 7am to 6pm - Pager - [phone]  After 6pm go to [email or web address] - Therapist, nutritional Hospitalists

## 2018-06-08 NOTE — Progress Notes (Signed)
[facility]  ROUNDING NOTE   Subjective:   Still lethargic. Wife at bedside.   UOP [id number]  Objective:  Vital signs in last 24 hours:  Temp:  [98.1 F (36.7 C)-99.5 F (37.5 C)] 98.1 F (36.7 C) (01/02 0806) Pulse Rate:  [75-84] 81 (01/02 0806) Resp:  [16-20] 20 (01/02 0806) BP: (137-164)/(73-84) 161/84 (01/02 0806) SpO2:  [94 %-96 %] 94 % (01/02 0806) Weight:  [113.5 kg] 113.5 kg (01/02 0541)  Weight change: 0.464 kg Filed Weights   06/06/18 0436 06/07/18 0423 06/08/18 0541  Weight: 112.8 kg 113 kg 113.5 kg    Intake/Output: I/O last 3 completed shifts: In: 1559.7 [P.O.:240; I.V.:727.4; IV Piggyback:592.3] Out: 950 [Urine:950]   Intake/Output this shift:  Total I/O In: -  Out: 450 [Urine:450]  Physical Exam: General: NAD,   Head: Normocephalic, atraumatic. Moist oral mucosal membranes  Eyes: Anicteric, PERRL  Neck: Supple, trachea midline  Lungs:  Clear to auscultation  Heart: Regular rate and rhythm  Abdomen:  Soft, nontender, obese  Extremities: Bilateral compression dressings lower extremities, + peripheral edema.  Neurologic: Somnolent.   Skin: No lesions        Basic Metabolic Panel: Recent Labs  Lab 06/05/18 1337 06/05/18 1727 06/06/18 0519 06/07/18 0417 06/08/18 0429 06/08/18 0632  NA 138  --  138 138 137  --   K 4.0  --  3.4* 3.5 3.6  --   CL 106  --  105 107 107  --   CO2 26  --  24 25 23   --   GLUCOSE 215*  --  190* 133* 145*  --   BUN 18  --  25* 26* 26*  --   CREATININE 1.62* 1.66* 2.58* 2.71* 2.45*  --   CALCIUM 8.8*  --  8.0* 7.9* 7.8*  --   MG  --   --   --  2.1  --  2.0  PHOS  --   --   --   --   --  4.0    Liver Function Tests: Recent Labs  Lab 06/05/18 1337 06/08/18 0632  AST 19 15  ALT 18 14  ALKPHOS 91 69  BILITOT 0.7 0.7  PROT 6.9 5.8*  ALBUMIN 3.0* 2.6*   No results for input(s): LIPASE, AMYLASE in the last 168 hours. Recent Labs  Lab 06/07/18 1942 06/08/18 0633  AMMONIA 21 28    CBC: Recent  Labs  Lab 06/05/18 1337 06/05/18 1727 06/06/18 0519 06/07/18 0417  WBC 9.4 10.5 9.2 8.2  HGB 10.9* 10.3* 8.5* 8.7*  HCT 34.2* 32.3* 27.2* 27.0*  MCV 83.6 81.8 82.9 81.8  PLT 322 326 287 267    Cardiac Enzymes: Recent Labs  Lab 06/05/18 2306 06/06/18 0519 06/06/18 1632 06/07/18 0826 06/08/18 0632  TROPONINI 0.04* 0.19* 0.25* 0.54* 0.44*    BNP: Invalid input(s): POCBNP  CBG: Recent Labs  Lab 06/07/18 1139 06/07/18 1635 06/07/18 2106 06/08/18 0546 06/08/18 0804  GLUCAP 213* 192* 146* 147* 145*    Microbiology: Results for orders placed or performed during the hospital encounter of 06/05/18  CULTURE, BLOOD (ROUTINE X 2) w Reflex to ID Panel     Status: None (Preliminary result)   Collection Time: 06/07/18  8:26 AM  Result Value Ref Range Status   Specimen Description BLOOD LEFT AC  Final   Special Requests   Final    BOTTLES DRAWN AEROBIC AND ANAEROBIC Blood Culture adequate volume   Culture   Final  NO GROWTH < 24 HOURS Performed at [facility], [address] [place], [place] [address]    Report Status PENDING  Incomplete  CULTURE, BLOOD (ROUTINE X 2) w Reflex to ID Panel     Status: None (Preliminary result)   Collection Time: 06/07/18  8:30 AM  Result Value Ref Range Status   Specimen Description BLOOD LEFT HAND  Final   Special Requests   Final    BOTTLES DRAWN AEROBIC AND ANAEROBIC Blood Culture adequate volume   Culture   Final    NO GROWTH < 24 HOURS Performed at [facility], [address] [place], [place] [address]    Report Status PENDING  Incomplete    Coagulation Studies: No results for input(s): LABPROT, INR in the last 72 hours.  Urinalysis: Recent Labs    06/05/18 2343  COLORURINE YELLOW*  LABSPEC 1.010  PHURINE 6.0  GLUCOSEU 50*  HGBUR NEGATIVE  BILIRUBINUR NEGATIVE  KETONESUR NEGATIVE  PROTEINUR 100*  NITRITE NEGATIVE  LEUKOCYTESUR NEGATIVE      Imaging: Ct Head Wo Contrast  Result Date:  06/08/2018 CLINICAL DATA:  Altered mental status.  Hypertension. EXAM: CT HEAD WITHOUT CONTRAST TECHNIQUE: Contiguous axial images were obtained from the base of the skull through the vertex without intravenous contrast. COMPARISON:  None. FINDINGS: Brain: The ventricles are normal in size and configuration. There is no intracranial mass, hemorrhage, extra-axial fluid collection, or midline shift. The brain parenchyma appears unremarkable. No acute infarct evident. Benign-appearing calcification is noted in the pineal region. Vascular: No hyperdense vessel. There is mild calcification in the distal vertebral artery on the left. Skull: Bony calvarium appears intact. Sinuses/Orbits: There is mild mucosal thickening in an ethmoid air cell on the right posteriorly. Other visualized paranasal sinuses are clear. Visualized orbits appear symmetric bilaterally. Other: Mastoid air cells are clear. IMPRESSION: No mass or hemorrhage. No acute infarct. Slight arterial vascular calcification. Mucosal thickening noted in a single posterior ethmoid air cell on the right. Electronically Signed   By: Bretta BangWilliam  Woodruff III M.D.   On: 06/08/2018 08:04     Medications:   . sodium chloride    . sodium chloride 100 mL/hr at 06/08/18 0833  . piperacillin-tazobactam (ZOSYN)  IV 3.375 g (06/08/18 0558)  . vancomycin Stopped (06/07/18 2307)   . aspirin EC  81 mg Oral Daily  . atorvastatin  40 mg Oral q1800  . heparin injection (subcutaneous)  5,000 Units Subcutaneous Q8H  . hydrALAZINE  75 mg Oral Q8H  . insulin aspart  0-9 Units Subcutaneous TID WC  . labetalol  100 mg Oral BID  . sodium chloride flush  3 mL Intravenous Q12H   sodium chloride, acetaminophen **OR** acetaminophen, hydrALAZINE, ondansetron **OR** ondansetron (ZOFRAN) IV, sodium chloride flush  Assessment/ Plan:  Mr. Ryan ChurchBrian Hudson is a 43 y.o. black male with diabetes mellitus type II, hypertension, hyperlipidemia, peripheral vascular disease, depression,  who was [facility]/2018 for hypertension and peripheral edema.   1. 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  Baseline creatinine of 1.09, GFR >60 on 07/27/17.  Nonnephrotic range proteinuria.  Now holding diuretics Holding lisinopril - IV fluids: NS at 1300mL/hr - monitor volume status   2. Hypertension with edema: echocardiogram reviewed with patient and wife.  Blood pressure at goal this morning - Discontinued amlodipine due to edema - most likely will need to be discharged with a diuretic.   3. Diabetes mellitus type II with renal manifestations. Hemoglobin  A1c 8.1%. Not at goal.  Complicated with diabetic foot ulcer.  - discussed that patient will need to follow up with endocrinology.   4. Altered mental status:  - consult Neurology.    LOS: 3 Aaralyn Kil 1/2/202011:18 AM

## 2018-06-08 NOTE — Progress Notes (Signed)
[facility]  Patient: Ryan ChurchBrian Leonard / Admit Date: 06/05/2018 / Date of Encounter: 06/08/2018, 9:00 AM   Subjective: Patient still weak fatigued and sleepy today.  No evidence of apparent hypotension and or chest pain or heart failure type symptoms at this time.  Elevated troponin to 0.25 most consistent with demand ischemia as well as chronic kidney disease and anemia.  Patient had echocardiogram showing severe left ventricular hypertrophy and diastolic dysfunction with ejection fraction of 70%.  Review of Systems: Positive for: Sleepiness weakness fatigue unchanged Negative for: Vision change, hearing change, syncope, dizziness, nausea, vomiting,diarrhea, bloody stool, stomach pain, cough, congestion, diaphoresis, urinary frequency, urinary pain,skin lesions, skin rashes Others previously listed  Objective: Telemetry: Normal sinus rhythm Physical Exam: Blood pressure (!) 161/84, pulse 81, temperature 98.1 F (36.7 C), resp. rate 20, height 6' (1.829 m), weight 113.5 kg, SpO2 94 %. Body mass index is 33.94 kg/m. General: Well developed, well nourished, in no acute distress. Head: Normocephalic, atraumatic, sclera non-icteric, no xanthomas, nares are without discharge. Neck: No apparent masses Lungs: Normal respirations with no wheezes, no rhonchi, no rales , no crackles   Heart: Regular rate and rhythm, normal S1 S2, right upper sternal border murmur, no rub, no gallop, PMI is normal size and placement, carotid upstroke normal without bruit, jugular venous pressure normal Abdomen: Soft, non-tender, non-distended with normoactive bowel sounds. No hepatosplenomegaly. Abdominal aorta is normal size without bruit Extremities: 1+ edema, no clubbing, no cyanosis positive ulcers,  Peripheral: 2+ radial, 2+ femoral, 2+ dorsal pedal pulses Neuro: Alert and oriented. Moves all extremities spontaneously. Psych:  Responds to questions appropriately with a normal  affect.   Intake/Output Summary (Last 24 hours) at 06/08/2018 0900 Last data filed at 06/08/2018 0500 Gross per 24 hour  Intake 1559.65 ml  Output 750 ml  Net 809.65 ml    Inpatient Medications:  . aspirin EC  81 mg Oral Daily  . atorvastatin  40 mg Oral q1800  . heparin injection (subcutaneous)  5,000 Units Subcutaneous Q8H  . hydrALAZINE  75 mg Oral Q8H  . insulin aspart  0-9 Units Subcutaneous TID WC  . labetalol  100 mg Oral BID  . sodium chloride flush  3 mL Intravenous Q12H   Infusions:  . sodium chloride    . sodium chloride 100 mL/hr at 06/08/18 0833  . piperacillin-tazobactam (ZOSYN)  IV 3.375 g (06/08/18 0558)  . vancomycin Stopped (06/07/18 2307)    Labs: Recent Labs    06/07/18 0417 06/08/18 0429 06/08/18 0632  NA 138 137  --   K 3.5 3.6  --   CL 107 107  --   CO2 25 23  --   GLUCOSE 133* 145*  --   BUN 26* 26*  --   CREATININE 2.71* 2.45*  --   CALCIUM 7.9* 7.8*  --   MG 2.1  --  2.0  PHOS  --   --  4.0   Recent Labs    06/05/18 1337 06/08/18 0632  AST 19 15  ALT 18 14  ALKPHOS 91 69  BILITOT 0.7 0.7  PROT 6.9 5.8*  ALBUMIN 3.0* 2.6*   Recent Labs    06/06/18 0519 06/07/18 0417  WBC 9.2 8.2  HGB 8.5* 8.7*  HCT 27.2* 27.0*  MCV 82.9 81.8  PLT 287 267   Recent Labs    06/06/18 0519 06/06/18 1632 06/07/18 0826 06/08/18 0632  TROPONINI 0.19* 0.25* 0.54* 0.44*   Invalid input(s): POCBNP Recent Labs  06/05/18 1548  HGBA1C 8.1*     Weights: Filed Weights   06/06/18 0436 06/07/18 0423 06/08/18 0541  Weight: 112.8 kg 113 kg 113.5 kg     Radiology/Studies:  Ct Head Wo Contrast  Result Date: 06/08/2018 CLINICAL DATA:  Altered mental status.  Hypertension. EXAM: CT HEAD WITHOUT CONTRAST TECHNIQUE: Contiguous axial images were obtained from the base of the skull through the vertex without intravenous contrast. COMPARISON:  None. FINDINGS: Brain: The ventricles are normal in size and configuration. There is no intracranial mass,  hemorrhage, extra-axial fluid collection, or midline shift. The brain parenchyma appears unremarkable. No acute infarct evident. Benign-appearing calcification is noted in the pineal region. Vascular: No hyperdense vessel. There is mild calcification in the distal vertebral artery on the left. Skull: Bony calvarium appears intact. Sinuses/Orbits: There is mild mucosal thickening in an ethmoid air cell on the right posteriorly. Other visualized paranasal sinuses are clear. Visualized orbits appear symmetric bilaterally. Other: Mastoid air cells are clear. IMPRESSION: No mass or hemorrhage. No acute infarct. Slight arterial vascular calcification. Mucosal thickening noted in a single posterior ethmoid air cell on the right. Electronically Signed   By: Bretta BangWilliam  Woodruff III M.D.   On: 06/08/2018 08:04   [place] Renal  Result Date: 06/05/2018 CLINICAL DATA:  Acute renal failure EXAM: RENAL / URINARY TRACT ULTRASOUND COMPLETE COMPARISON:  None. FINDINGS: Right Kidney: Renal measurements: 10.9 x 5.7 x 6.1 cm = volume: 199 mL. The renal cortical echotexture exceeds that of the adjacent liver. There is no hydronephrosis nor cystic or solid mass demonstrated. Left Kidney: Renal measurements: 11.8 x 6.4 x 6.2 cm = volume: 244 mL. The cortical echotexture is increased similar to that on the right. There is no cystic or solid mass nor hydronephrosis. Bladder: Appears normal for degree of bladder distention. Bilateral ureteral jets are observed. IMPRESSION: Increased renal cortical echotexture bilaterally consistent with medical renal disease. No suspicious masses. No hydronephrosis. Bilateral ureteral jets are observed in the bladder. Electronically Signed   By: David  [place]azil[place]Jordan M.D.   On: 06/05/2018 15:56   Dg Chest Portable 1 View  Result Date: 06/05/2018 CLINICAL DATA:  Cough, wheezing, and chest congestion which began at 2 a.m. this morning. Recent fever with vomiting and diarrhea lasting 1 day last week. History of  diabetes, hypertension. EXAM: PORTABLE CHEST 1 VIEW COMPARISON:  None. FINDINGS: The lungs are adequately inflated. There is no focal infiltrate. There is no pleural effusion. The right hemidiaphragm is mildly elevated as compared to the left. The heart and pulmonary vascularity are normal. The mediastinum is normal in width. The bony thorax exhibits no acute abnormality. IMPRESSION: There is no pneumonia nor other acute cardiopulmonary abnormality. Mild elevation of the right hemidiaphragm may be normal for the patient, but right hemidiaphragm dysfunction is not excluded. I have no previous studies with which to compare. Electronically Signed   By: David  [place] M.D.   On: 06/05/2018 13:50     Assessment and Recommendation  43 y.o. male with diabetes with complications of peripheral vascular disease hyperlipidemia hypertension chronic kidney disease stage III anemia with acute shortness of breath multifactorial in nature including diastolic dysfunction elevated troponin consistent with demand ischemia rather than acute coronary syndrome slightly improved today 1.  Continue hypertension control with appropriate medication management including beta-blocker for further risk reduction of left ventricular hypertrophy and diastolic dysfunction 2.  Continue treatment of anemia and chronic kidney disease as able 3.  No further cardiac diagnostics necessary at this time 4.  High intensity cholesterol therapy 5.  Begin ambulation and follow for improvements of symptoms and possible discharged home if able with follow-up from cardiology standpoint in 1 to 2 weeks for further adjustments of medication management 6.  Call if further questions  Signed, Arnoldo Hooker M.D. FACC

## 2018-06-08 NOTE — Plan of Care (Signed)
  Problem: Education: Goal: Ability to demonstrate management of disease process will improve Outcome: Progressing   Problem: Fluid Volume: Goal: Ability to maintain a balanced intake and output will improve Outcome: Progressing   Problem: Skin Integrity: Goal: Risk for impaired skin integrity will decrease Outcome: Progressing

## 2018-06-08 NOTE — Consult Note (Signed)
Reason for Consult: Altered mental status Referring Physician: Lamont Dowdy  CC: Confusion and disorientation  HPI: Ryan Leonard is an 43 y.o. male with past medical history of depression, diabetes mellitus, hyperlipidemia, uncontrolled hypertension, and peripheral vascular disease who was admitted on 06/05/2018 with chief complaints of shortness of breath and found to be in acute renal failure, hypertensive urgency and hypoxia.  Patient's wife who is currently at the bedside report that patient has been having increased work of breathing, weight gain and worsening lower extremity edema.  Work-up in the ED showed elevated BNP of 234, blood glucose 215, creatinine 1.62, hemoglobin 10.9 hematocrit 34.2, hemoglobin A1c 8.1, elevated troponin, and decreased kidney function.  However with time he was noted to have continued decreased kidney function with alteration of awareness.  Per patient's wife patient was noted to be more disoriented, lethargic and hard to engage in conversation.  Per wife, patient was having difficulty getting his words and numbers right, he also appeared to be sleeping excessively therefore neurology was consulted for further evaluation.  Initial CT head today was normal with no acute intracranial abnormality noted.  Past Medical History:  Diagnosis Date  . Depression   . Diabetes mellitus without complication (HCC)   . Hyperlipidemia   . Hypertension   . Peripheral vascular disease [facility])     Past Surgical History:  Procedure Laterality Date  . EYE SURGERY    . LOWER EXTREMITY ANGIOGRAPHY Right 07/28/2017   Procedure: LOWER EXTREMITY ANGIOGRAPHY;  Surgeon: Annice Needy, MD;  Location: [facility] INVASIVE CV LAB;  Service: Cardiovascular;  Laterality: Right;  . WISDOM TOOTH EXTRACTION      Family History  Problem Relation Age of Onset  . Diabetes Maternal Grandmother   . Colon cancer Maternal Grandfather     Social History:  reports that he has never smoked. He has never  used smokeless tobacco. He reports that he does not drink alcohol or use drugs.  Allergies  Allergen Reactions  . Codeine Nausea Only    Medications:  I have reviewed the patient's current medications. Prior to Admission:  Medications Prior to Admission  Medication Sig Dispense Refill Last Dose  . amLODipine (NORVASC) 10 MG tablet Take 10 mg by mouth daily.    06/04/2018 at 0800  . aspirin EC 81 MG tablet Take 1 tablet (81 mg total) by mouth daily. 150 tablet 2 06/04/2018 at Unknown time  . atorvastatin (LIPITOR) 20 MG tablet Take 20 mg by mouth at bedtime.   11 06/04/2018 at 2000  . Cholecalciferol (VITAMIN D) 125 MCG (5000 UT) CAPS Take 5,000 Units by mouth daily.    06/04/2018 at 0800  . glipiZIDE (GLUCOTROL) 10 MG tablet Take 10 mg by mouth 2 (two) times daily.   3 06/04/2018 at 1800  . lisinopril (PRINIVIL,ZESTRIL) 40 MG tablet Take 40 mg by mouth daily.    06/04/2018 at 0800  . metFORMIN (GLUCOPHAGE) 500 MG tablet Take 500 mg by mouth 2 (two) times daily with a meal.    06/04/2018 at 1800   Scheduled: . aspirin EC  81 mg Oral Daily  . atorvastatin  40 mg Oral q1800  . heparin injection (subcutaneous)  5,000 Units Subcutaneous Q8H  . hydrALAZINE  75 mg Oral Q8H  . insulin aspart  0-9 Units Subcutaneous TID WC  . labetalol  100 mg Oral BID  . sodium chloride flush  3 mL Intravenous Q12H    ROS: Able to obtain from patient due to current mental status  Physical Examination: Blood pressure (!) 161/84, pulse 81, temperature 98.1 F (36.7 C), resp. rate 20, height 6' (1.829 m), weight 113.5 kg, SpO2 94 %.  HEENT-  Normocephalic, no lesions, without obvious abnormality.  Normal external eye and conjunctiva.  Normal TM's bilaterally.  Normal auditory canals and external ears. Normal external nose, mucus membranes and septum.  Normal pharynx. Cardiovascular- S1, S2 normal, pulses palpable throughout   Lungs- chest clear, no wheezing, rales, normal symmetric air entry Abdomen-  soft, non-tender; bowel sounds normal; no masses,  no organomegaly Extremities- bilateral pitting edema of lower extremities Lymph-no adenopathy palpable Musculoskeletal-no joint tenderness, deformity or swelling Skin-warm and dry, no hyperpigmentation, vitiligo, or suspicious lesions  Neurological Exam   Mental Status: Asleep but arouses easily to verbal and tactile stimuli.  Oriented x3, thought content appropriate.  Speech fluent without evidence of aphasia.  Able to follow 3 step commands without difficulty. Attention span and concentration seemed appropriate  Cranial Nerves: II: Discs flat bilaterally; Visual fields grossly normal, pupils equal, round, reactive to light and accommodation III,IV, VI: ptosis not present, extra-ocular motions intact bilaterally V,VII: smile symmetric, facial light touch sensation intact VIII: hearing normal bilaterally IX,X: gag reflex present XI: bilateral shoulder shrug XII: midline tongue extension Motor: Right :  Upper extremity   5/5 Without pronator drift      Left: Upper extremity   5/5 without pronator drift Right:   Lower extremity   5/5                                          Left: Lower extremity   5/5 Tone and bulk:normal tone throughout; no atrophy noted Sensory: Pinprick and light touch intact bilaterally Deep Tendon Reflexes: 2+ and symmetric throughout Plantars: Right: mute                              Left: mute Cerebellar: Finger-to-nose testing intact bilaterally. Heel to shin testing normal bilaterally Gait: not tested due to safety concerns  Data Reviewed  Laboratory Studies:   Basic Metabolic Panel: Recent Labs  Lab 06/05/18 1337 06/05/18 1727 06/06/18 0519 06/07/18 0417 06/08/18 0429 06/08/18 0632  NA 138  --  138 138 137  --   K 4.0  --  3.4* 3.5 3.6  --   CL 106  --  105 107 107  --   CO2 26  --  24 25 23   --   GLUCOSE 215*  --  190* 133* 145*  --   BUN 18  --  25* 26* 26*  --   CREATININE 1.62* 1.66* 2.58*  2.71* 2.45*  --   CALCIUM 8.8*  --  8.0* 7.9* 7.8*  --   MG  --   --   --  2.1  --  2.0  PHOS  --   --   --   --   --  4.0    Liver Function Tests: Recent Labs  Lab 06/05/18 1337 06/08/18 0632  AST 19 15  ALT 18 14  ALKPHOS 91 69  BILITOT 0.7 0.7  PROT 6.9 5.8*  ALBUMIN 3.0* 2.6*   No results for input(s): LIPASE, AMYLASE in the last 168 hours. Recent Labs  Lab 06/07/18 1942 06/08/18 0633  AMMONIA 21 28    CBC: Recent Labs  Lab 06/05/18 1337 06/05/18 1727  06/06/18 0519 06/07/18 0417  WBC 9.4 10.5 9.2 8.2  HGB 10.9* 10.3* 8.5* 8.7*  HCT 34.2* 32.3* 27.2* 27.0*  MCV 83.6 81.8 82.9 81.8  PLT 322 326 287 267    Cardiac Enzymes: Recent Labs  Lab 06/05/18 2306 06/06/18 0519 06/06/18 1632 06/07/18 0826 06/08/18 0632  TROPONINI 0.04* 0.19* 0.25* 0.54* 0.44*    BNP: Invalid input(s): POCBNP  CBG: Recent Labs  Lab 06/07/18 1635 06/07/18 2106 06/08/18 0546 06/08/18 0804 06/08/18 1200  GLUCAP 192* 146* 147* 145* 179*    Microbiology: Results for orders placed or performed during the hospital encounter of 06/05/18  CULTURE, BLOOD (ROUTINE X 2) w Reflex to ID Panel     Status: None (Preliminary result)   Collection Time: 06/07/18  8:26 AM  Result Value Ref Range Status   Specimen Description BLOOD LEFT AC  Final   Special Requests   Final    BOTTLES DRAWN AEROBIC AND ANAEROBIC Blood Culture adequate volume   Culture   Final    NO GROWTH < 24 HOURS Performed at [facility], [address] [place], [place] [address]    Report Status PENDING  Incomplete  CULTURE, BLOOD (ROUTINE X 2) w Reflex to ID Panel     Status: None (Preliminary result)   Collection Time: 06/07/18  8:30 AM  Result Value Ref Range Status   Specimen Description BLOOD LEFT HAND  Final   Special Requests   Final    BOTTLES DRAWN AEROBIC AND ANAEROBIC Blood Culture adequate volume   Culture   Final    NO GROWTH < 24 HOURS Performed at [facility], [address] [place], [place] [address]    Report Status PENDING  Incomplete    Coagulation Studies: No results for input(s): LABPROT, INR in the last 72 hours.  Urinalysis:  Recent Labs  Lab 06/05/18 2343  COLORURINE YELLOW*  LABSPEC 1.010  PHURINE 6.0  GLUCOSEU 50*  HGBUR NEGATIVE  BILIRUBINUR NEGATIVE  KETONESUR NEGATIVE  PROTEINUR 100*  NITRITE NEGATIVE  LEUKOCYTESUR NEGATIVE    Lipid Panel:  No results found for: CHOL, TRIG, HDL, CHOLHDL, VLDL, LDLCALC  HgbA1C:  Lab Results  Component Value Date   HGBA1C 8.1 (H) 06/05/2018    Urine Drug Screen:      Component Value Date/Time   LABOPIA NONE DETECTED 06/08/2018 1051   COCAINSCRNUR NONE DETECTED 06/08/2018 1051   LABBENZ NONE DETECTED 06/08/2018 1051   AMPHETMU NONE DETECTED 06/08/2018 1051   THCU NONE DETECTED 06/08/2018 1051   LABBARB NONE DETECTED 06/08/2018 1051    Alcohol Level: No results for input(s): ETH in the last 168 hours.  Other results: EKG: there are no previous tracings available for comparison.  Imaging: Ct Head Wo Contrast  Result Date: 06/08/2018 CLINICAL DATA:  Altered mental status.  Hypertension. EXAM: CT HEAD WITHOUT CONTRAST TECHNIQUE: Contiguous axial images were obtained from the base of the skull through the vertex without intravenous contrast. COMPARISON:  None. FINDINGS: Brain: The ventricles are normal in size and configuration. There is no intracranial mass, hemorrhage, extra-axial fluid collection, or midline shift. The brain parenchyma appears unremarkable. No acute infarct evident. Benign-appearing calcification is noted in the pineal region. Vascular: No hyperdense vessel. There is mild calcification in the distal vertebral artery on the left. Skull: Bony calvarium appears intact. Sinuses/Orbits: There is mild mucosal thickening in an ethmoid air cell on the right posteriorly. Other visualized paranasal sinuses are clear. Visualized orbits appear symmetric bilaterally. Other: Mastoid air  cells are clear. IMPRESSION: No mass or hemorrhage. No acute infarct. Slight arterial vascular calcification. Mucosal thickening noted in a single posterior ethmoid air cell on the right. Electronically Signed   By: Bretta BangWilliam  Woodruff III M.D.   On: 06/08/2018 08:04   Assessment: 43 y.o male with past medical history of depression, diabetes mellitus, hyperlipidemia, uncontrolled hypertension, and peripheral vascular disease who presented to the ED on 06/05/2018 with chief complaints of shortness of breath and found to be in acute renal failure, hypertensive urgency and hypoxia.  Developed altered mental status likely metabolic encephalopathy in the setting of acute renal failure.  CT head reviewed and showed no acute intracranial abnormality.  Mental status improving however this may lag behind as renal function continues to improve.  No further work-up indicated at this time from neurology standpoint.  Recommendations 1. Agree with current medical management of underlying medical condition.  Please call neurology with any worsening symptoms or concerns  This patient was staffed with Dr. Loretha BrasilZeylikman, Doyle AskewYuriy who personally evaluated patient, reviewed documentation and agreed with assessment and plan of care as above.  Webb SilversmithElizabeth Hermena Swint, DNP, FNP-BC Board certified Nurse Practitioner Neurology Department   06/08/2018, 1:45 PM

## 2018-06-09 LAB — BASIC METABOLIC PANEL
Anion gap: 5 (ref 5–15)
BUN: 21 mg/dL — ABNORMAL HIGH (ref 6–20)
CO2: 24 mmol/L (ref 22–32)
CREATININE: 2.04 mg/dL — AB (ref 0.61–1.24)
Calcium: 7.6 mg/dL — ABNORMAL LOW (ref 8.9–10.3)
Chloride: 111 mmol/L (ref 98–111)
GFR calc Af Amer: 45 mL/min — ABNORMAL LOW (ref >60)
GFR calc non Af Amer: 38 mL/min — ABNORMAL LOW (ref >60)
GLUCOSE: 123 mg/dL — AB (ref 70–99)
Potassium: 3.8 mmol/L (ref 3.5–5.1)
Sodium: 140 mmol/L (ref 135–145)

## 2018-06-09 LAB — GLUCOSE, CAPILLARY: Glucose-Capillary: 125 mg/dL — ABNORMAL HIGH (ref 70–99)

## 2018-06-09 LAB — CALCIUM, IONIZED: Calcium, Ionized, Serum: 4.6 mg/dL (ref 4.5–5.6)

## 2018-06-09 LAB — RPR: RPR Ser Ql: NONREACTIVE

## 2018-06-09 MED ORDER — HYDRALAZINE HCL 25 MG PO TABS: 75.0000 mg | ORAL_TABLET | Freq: Three times a day (TID) | ORAL | 1 refills | Status: AC

## 2018-06-09 MED ORDER — ATORVASTATIN CALCIUM 40 MG PO TABS: 40.0000 mg | ORAL_TABLET | Freq: Every day | ORAL | 1 refills | Status: AC

## 2018-06-09 MED ORDER — LABETALOL HCL 100 MG PO TABS: 100.0000 mg | ORAL_TABLET | Freq: Two times a day (BID) | ORAL | 1 refills | Status: AC

## 2018-06-09 NOTE — Progress Notes (Signed)
[facility]  ROUNDING NOTE   Subjective:   Wife at bedside.  Walking hallways this morning.   Creatinine 2.04 (2.45)  NS at 15000mL/hr  Objective:  Vital signs in last 24 hours:  Temp:  [98.1 F (36.7 C)-99.6 F (37.6 C)] 98.3 F (36.8 C) (01/03 0805) Pulse Rate:  [75-85] 85 (01/03 0805) Resp:  [19-20] 19 (01/03 0805) BP: (147-155)/(71-81) 155/79 (01/03 0805) SpO2:  [94 %-95 %] 94 % (01/03 0805)  Weight change:  Filed Weights   06/06/18 0436 06/07/18 0423 06/08/18 0541  Weight: 112.8 kg 113 kg 113.5 kg    Intake/Output: I/O last 3 completed shifts: In: 3241.4 [P.O.:120; I.V.:2408.5; IV Piggyback:712.8] Out: 1350 [Urine:1350]   Intake/Output this shift:  No intake/output data recorded.  Physical Exam: General: NAD,   Head: Normocephalic, atraumatic. Moist oral mucosal membranes  Eyes: Anicteric, PERRL  Neck: Supple, trachea midline  Lungs:  Clear to auscultation  Heart: Regular rate and rhythm  Abdomen:  Soft, nontender, obese  Extremities: Bilateral compression dressings lower extremities, + peripheral edema.  Neurologic: Somnolent.   Skin: No lesions        Basic Metabolic Panel: Recent Labs  Lab 06/05/18 1337 06/05/18 1727 06/06/18 0519 06/07/18 0417 06/08/18 0429 06/08/18 0632 06/09/18 0426  NA 138  --  138 138 137  --  140  K 4.0  --  3.4* 3.5 3.6  --  3.8  CL 106  --  105 107 107  --  111  CO2 26  --  24 25 23   --  24  GLUCOSE 215*  --  190* 133* 145*  --  123*  BUN 18  --  25* 26* 26*  --  21*  CREATININE 1.62* 1.66* 2.58* 2.71* 2.45*  --  2.04*  CALCIUM 8.8*  --  8.0* 7.9* 7.8*  --  7.6*  MG  --   --   --  2.1  --  2.0  --   PHOS  --   --   --   --   --  4.0  --     Liver Function Tests: Recent Labs  Lab 06/05/18 1337 06/08/18 0632  AST 19 15  ALT 18 14  ALKPHOS 91 69  BILITOT 0.7 0.7  PROT 6.9 5.8*  ALBUMIN 3.0* 2.6*   No results for input(s): LIPASE, AMYLASE in the last 168 hours. Recent Labs  Lab  06/07/18 1942 06/08/18 0633  AMMONIA 21 28    CBC: Recent Labs  Lab 06/05/18 1337 06/05/18 1727 06/06/18 0519 06/07/18 0417  WBC 9.4 10.5 9.2 8.2  HGB 10.9* 10.3* 8.5* 8.7*  HCT 34.2* 32.3* 27.2* 27.0*  MCV 83.6 81.8 82.9 81.8  PLT 322 326 287 267    Cardiac Enzymes: Recent Labs  Lab 06/05/18 2306 06/06/18 0519 06/06/18 1632 06/07/18 0826 06/08/18 0632  TROPONINI 0.04* 0.19* 0.25* 0.54* 0.44*    BNP: Invalid input(s): POCBNP  CBG: Recent Labs  Lab 06/08/18 0804 06/08/18 1200 06/08/18 1648 06/08/18 2100 06/09/18 0805  GLUCAP 145* 179* 160* 137* 125*    Microbiology: Results for orders placed or performed during the hospital encounter of 06/05/18  CULTURE, BLOOD (ROUTINE X 2) w Reflex to ID Panel     Status: None (Preliminary result)   Collection Time: 06/07/18  8:26 AM  Result Value Ref Range Status   Spec[facility]  Final   Special Requests   Final    BOTTLES DRAWN AEROBIC AND ANAEROBIC Blood Culture  adequate volume   Culture   Final    NO GROWTH < 24 HOURS Performed at [facility], [address] [place], [place] [address]    Report Status PENDING  Incomplete  CULTURE, BLOOD (ROUTINE X 2) w Reflex to ID Panel     Status: None (Preliminary result)   Collection Time: 06/07/18  8:30 AM  Result Value Ref Range Status   Specimen Description BLOOD LEFT HAND  Final   Special Requests   Final    BOTTLES DRAWN AEROBIC AND ANAEROBIC Blood Culture adequate volume   Culture   Final    NO GROWTH < 24 HOURS Performed at [facility], [address] [place], [place] [address]    Report Status PENDING  Incomplete    Coagulation Studies: No results for input(s): LABPROT, INR in the last 72 hours.  Urinalysis: No results for input(s): COLORURINE, LABSPEC, PHURINE, GLUCOSEU, HGBUR, BILIRUBINUR, KETONESUR, PROTEINUR, UROBILINOGEN, NITRITE, LEUKOCYTESUR in the last 72 hours.  Invalid input(s): APPERANCEUR     Imaging: Ct Head Wo Contrast  Result Date: 06/08/2018 CLINICAL DATA:  Altered mental status.  Hypertension. EXAM: CT HEAD WITHOUT CONTRAST TECHNIQUE: Contiguous axial images were obtained from the base of the skull through the vertex without intravenous contrast. COMPARISON:  None. FINDINGS: Brain: The ventricles are normal in size and configuration. There is no intracranial mass, hemorrhage, extra-axial fluid collection, or midline shift. The brain parenchyma appears unremarkable. No acute infarct evident. Benign-appearing calcification is noted in the pineal region. Vascular: No hyperdense vessel. There is mild calcification in the distal vertebral artery on the left. Skull: Bony calvarium appears intact. Sinuses/Orbits: There is mild mucosal thickening in an ethmoid air cell on the right posteriorly. Other visualized paranasal sinuses are clear. Visualized orbits appear symmetric bilaterally. Other: Mastoid air cells are clear. IMPRESSION: No mass or hemorrhage. No acute infarct. Slight arterial vascular calcification. Mucosal thickening noted in a single posterior ethmoid air cell on the right. Electronically Signed   By: Bretta BangWilliam  Woodruff III M.D.   On: 06/08/2018 08:04     Medications:   . sodium chloride    . piperacillin-tazobactam (ZOSYN)  IV 3.375 g (06/09/18 0525)  . vancomycin 1,500 mg (06/08/18 2107)   . aspirin EC  81 mg Oral Daily  . atorvastatin  40 mg Oral q1800  . heparin injection (subcutaneous)  5,000 Units Subcutaneous Q8H  . hydrALAZINE  75 mg Oral Q8H  . insulin aspart  0-9 Units Subcutaneous TID WC  . labetalol  100 mg Oral BID  . sodium chloride flush  3 mL Intravenous Q12H   sodium chloride, acetaminophen **OR** acetaminophen, hydrALAZINE, ondansetron **OR** ondansetron (ZOFRAN) IV, sodium chloride flush  Assessment/ Plan:  Mr. Doristine ChurchBrian Burnside is a 43 y.o. black male with diabetes mellitus type II, hypertension, hyperlipidemia, peripheral vascular disease,  depression, who was admitted to [facility] on 06/05/2018 for hypertension and peripheral edema.   1. 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  Baseline creatinine of 1.09, GFR >60 on 07/27/17.  Nonnephrotic range proteinuria.  Now holding diuretics Holding lisinopril Discontinue IV fluids.   2. Hypertension with edema:  155/79 Blood pressure at goal this morning - Discontinued amlodipine due to edema - most likely will need to be discharged with a diuretic.   3. Diabetes mellitus type II with renal manifestations. Hemoglobin A1c 8.1%. Not at goal.  Complicated with diabetic foot ulcer.  - discussed that patient will need to follow up with endocrinology.  Outpatient follow up 1/8 at 10:30am.    LOS: 4 Hillary Schwegler 1/3/202011:28 AM

## 2018-06-09 NOTE — Discharge Summary (Signed)
[facility] [place] at [facility]   PATIENT NAME: Lemarcus Christine    MR#:  [id number]  DATE OF BIRTH:  Feb 02, 1976  DATE OF ADMISSION:  06/05/2018   ADMITTING PHYSICIAN: Auburn Bilberry, MD  DATE OF DISCHARGE: 06/09/2018 12:01 PM  PRIMARY CARE PHYSICIAN: Patient, No Pcp Per   ADMISSION DIAGNOSIS:  Hypoxia [R09.02] Acute renal failure (ARF) (HCC) [N17.9] Hypertensive urgency [I16.0] Dyspnea, unspecified type [R06.00] DISCHARGE DIAGNOSIS:  Active Problems:   Hypertension, accelerated  SECONDARY DIAGNOSIS:   Past Medical History:  Diagnosis Date  . Depression   . Diabetes mellitus without complication (HCC)   . Hyperlipidemia   . Hypertension   . Peripheral vascular disease [facility])    HOSPITAL COURSE:  Patient is 43 year old with history of hypertension diabetes hyperlipidemia questionable medical compliance presenting with shortness of breath accelerated hypertension  1.Accelerated hypertension, Better controlled with hydralazine and labetalol. Hold lisinopril in light of elevated creatinine Discontinued Norvasc in light of significant lower extremity edema IV hydralazine PRN  2.Shortness of breath chest x-ray is negative He was given IV Lasix  echocardiogram: LV EF: 65% - 70%, unremarkable. Shortness of breath improved.  3.Acute renal failure suspect likely has significant proteinuria. Hold lisinopril and Lasix due to worsening renal function.   Improving with IV fluids and follow-up BMP as outpatient.  4.Right lower extremity ulcer  Continue wound care.  5.Diabetes type 2  Hold metformin and glipizide due to renal failure, continue sliding scale, hemoglobin A1c 8.1.  Follow-up PCP to adjust diabetes medication.  Elevated troponin.  In cardiac etiology, possible related to acute renal failure.  The patient has no chest pain no palpitation. Continue aspirin and Lipitor. Per Dr. Gwen Pounds, No further cardiac diagnostics necessary  at this time.  Follow-up Dr. Gwen Pounds as outpatient.  Hypokalemia.  Improved with potassium supplement.  Anemia of chronic disease.  Hemoglobin decreased to 8.5.  No active bleeding, follow-up hemoglobin is a stable..  PVD, atherosclerosis disease.  Continue aspirin and Lipitor, follow-up Dr. Wyn Quaker as outpatient.  Fever of unknown etiology. Chest x-ray is unremarkable, urinalysis is unremarkable.  No leukocytosis. Started Zosyn and vancomycin pharmacy to dose.  Negative blood culture.  Fever improved.  Discontinue antibiotics.  Acute metabolic encephalopathy, possible due to renal failure. CT head is unremarkable. The patient is alert, awake and oriented.  Continue current medical management per neurology consult.  Obesity.  He is advised diet control and exercise.  Follow-up PCP.  I discussed with Dr. Wynelle Link. DISCHARGE CONDITIONS:  Stable, discharge to home today. CONSULTS OBTAINED:  Treatment Team:  Lamont Dowdy, MD Lamar Blinks, MD Kym Groom, MD Pauletta Browns, MD DRUG ALLERGIES:   Allergies  Allergen Reactions  . Codeine Nausea Only   DISCHARGE MEDICATIONS:   Allergies as of 06/09/2018      Reactions   Codeine Nausea Only      Medication List    STOP taking these medications   amLODipine 10 MG tablet Commonly known as:  NORVASC   glipiZIDE 10 MG tablet Commonly known as:  GLUCOTROL   lisinopril 40 MG tablet Commonly known as:  PRINIVIL,ZESTRIL   metFORMIN 500 MG tablet Commonly known as:  GLUCOPHAGE     TAKE these medications   aspirin EC 81 MG tablet Take 1 tablet (81 mg total) by mouth daily.   atorvastatin 40 MG tablet Commonly known as:  LIPITOR Take 1 tablet (40 mg total) by mouth daily at 6 PM. What changed:    medication strength  how  much to take  when to take this   hydrALAZINE 25 MG tablet Commonly known as:  APRESOLINE Take 3 tablets (75 mg total) by mouth every 8 (eight) hours.   labetalol 100 MG  tablet Commonly known as:  NORMODYNE Take 1 tablet (100 mg total) by mouth 2 (two) times daily.   Vitamin D 125 MCG (5000 UT) Caps Take 5,000 Units by mouth daily.        DISCHARGE INSTRUCTIONS:  See AVS.  If you experience worsening of your admission symptoms, develop shortness of breath, life threatening emergency, suicidal or homicidal thoughts you must seek medical attention immediately by calling 911 or calling your MD immediately  if symptoms less severe.  You Must read complete instructions/literature along with all the possible adverse reactions/side effects for all the Medicines you take and that have been prescribed to you. Take any new Medicines after you have completely understood and accpet all the possible adverse reactions/side effects.   Please note  You were cared for by a hospitalist during your hospital stay. If you have any questions about your discharge medications or the care you received while you were in the hospital after you are discharged, you can call the unit and asked to speak with the hospitalist on call if the hospitalist that took care of you is not available. Once you are discharged, your primary care physician will handle any further medical issues. Please note that NO REFILLS for any discharge medications will be authorized once you are discharged, as it is imperative that you return to your primary care physician (or establish a relationship with a primary care physician if you do not have one) for your aftercare needs so that they can reassess your need for medications and monitor your lab values.    On the day of Discharge:  VITAL SIGNS:  Blood pressure (!) 155/79, pulse 85, temperature 98.3 F (36.8 C), temperature source Oral, resp. rate 19, height 6' (1.829 m), weight 113.5 kg, SpO2 94 %. PHYSICAL EXAMINATION:  GENERAL:  43 y.o.-year-old patient lying in the bed with no acute distress.  Obesity. EYES: Pupils equal, round, reactive to light and  accommodation. No scleral icterus. Extraocular muscles intact.  HEENT: Head atraumatic, normocephalic. Oropharynx and nasopharynx clear.  NECK:  Supple, no jugular venous distention. No thyroid enlargement, no tenderness.  LUNGS: Normal breath sounds bilaterally, no wheezing, rales,rhonchi or crepitation. No use of accessory muscles of respiration.  CARDIOVASCULAR: S1, S2 normal. No murmurs, rubs, or gallops.  ABDOMEN: Soft, non-tender, non-distended. Bowel sounds present. No organomegaly or mass.  EXTREMITIES: No pedal edema, cyanosis, or clubbing.  Bilateral leg in wrapping. NEUROLOGIC: Cranial nerves II through XII are intact. Muscle strength 5/5 in all extremities. Sensation intact. Gait not checked.  PSYCHIATRIC: The patient is alert and oriented x 3.  SKIN: No obvious rash, lesion, or ulcer.  DATA REVIEW:   CBC Recent Labs  Lab 06/07/18 0417  WBC 8.2  HGB 8.7*  HCT 27.0*  PLT 267    Chemistries  Recent Labs  Lab 06/08/18 0632 06/09/18 0426  NA  --  140  K  --  3.8  CL  --  111  CO2  --  24  GLUCOSE  --  123*  BUN  --  21*  CREATININE  --  2.04*  CALCIUM  --  7.6*  MG 2.0  --   AST 15  --   ALT 14  --   ALKPHOS 69  --  BILITOT 0.7  --      Microbiology Results  Results for orders placed or performed during the hospital encounter of 06/05/18  CULTURE, BLOOD (ROUTINE X 2) w Reflex to ID Panel     Status: None (Preliminary result)   Collection Time: 06/07/18  8:26 AM  Result Value Ref Range Status   Specimen Description BLOOD LEFT AC  Final   Special Requests   Final    BOTTLES DRAWN AEROBIC AND ANAEROBIC Blood Culture adequate volume   Culture   Final    NO GROWTH < 24 HOURS Performed at [facility], [address], [place], [place] [address]    Report Status PENDING  Incomplete  CULTURE, BLOOD (ROUTINE X 2) w Reflex to ID Panel     Status: None (Preliminary result)   Collection Time: 06/07/18  8:30 AM  Result Value Ref Range Status   Specimen  Description BLOOD LEFT HAND  Final   Special Requests   Final    BOTTLES DRAWN AEROBIC AND ANAEROBIC Blood Culture adequate volume   Culture   Final    NO GROWTH < 24 HOURS Performed at [facility], [address], [place], [place] [address]    Report Status PENDING  Incomplete    RADIOLOGY:  No results found.   Management plans discussed with the patient, his wife and they are in agreement.  CODE STATUS: Full Code   TOTAL TIME TAKING CARE OF THIS PATIENT: 33 minutes.    Shaune Pollack M.D on 06/09/2018 at 12:09 PM  Between 7am to 6pm - Pager - [phone]  After 6pm go to [email or web address] - Social research officer, government  [facility]  Office  [phone]  CC: Primary care physician; Patient, No Pcp Per   Note: This dictation was prepared with Dragon dictation along with smaller phrase technology. Any transcriptional errors that result from this process are unintentional.

## 2018-06-09 NOTE — Discharge Instructions (Signed)
Diet control and exercise.

## 2018-06-09 NOTE — Progress Notes (Signed)
[facility] - [place] at [facility]        Deotis Boodram was admitted to the Hospital on 06/05/2018 and Discharged  06/09/2018 and should be excused from work/school   for 12 days starting 06/05/2018 , may return to work/school without any restrictions.  Shaune Pollack M.D on 06/09/2018,at 10:38 AM  [facility] - [place] at [facility]  [phone]

## 2018-06-09 NOTE — Plan of Care (Signed)
  Problem: Education: Goal: Ability to verbalize understanding of medication therapies will improve Outcome: Progressing   Problem: Coping: Goal: Ability to adjust to condition or change in health will improve Outcome: Progressing   Problem: Health Behavior/Discharge Planning: Goal: Ability to manage health-related needs will improve Outcome: Progressing

## 2018-06-09 NOTE — Progress Notes (Signed)
Patient ambulated in hall with staff.  Tolerated well.  States feels good to be up walking around. Wife at bedside.

## 2018-06-09 NOTE — Progress Notes (Signed)
Patient discharged to home with wife. Tele and IV d/c'd prior to discharge.  Patient and spouse verbalize understanding of discharge orders. State have all belongings. Patient legs unwrapped as per wound care orders prior to discharge.  States decrease in swelling.  Wound noted on R inner ankle.  Wife states this is ongoing wound. Wound center appt. In place. Wound wrapped with DSD and kling prior to leaving.

## 2018-06-12 LAB — CULTURE, BLOOD (ROUTINE X 2)
CULTURE: NO GROWTH
Culture: NO GROWTH
Special Requests: ADEQUATE
Special Requests: ADEQUATE

## 2018-06-15 ENCOUNTER — Ambulatory Visit: Payer: Managed Care, Other (non HMO) | Admitting: Physician Assistant

## 2018-06-20 ENCOUNTER — Encounter: Payer: Managed Care, Other (non HMO) | Attending: Physician Assistant | Admitting: Physician Assistant

## 2018-06-20 DIAGNOSIS — Z794 Long term (current) use of insulin: Secondary | ICD-10-CM | POA: Diagnosis not present

## 2018-06-20 DIAGNOSIS — I252 Old myocardial infarction: Secondary | ICD-10-CM | POA: Diagnosis not present

## 2018-06-20 DIAGNOSIS — I509 Heart failure, unspecified: Secondary | ICD-10-CM | POA: Insufficient documentation

## 2018-06-20 DIAGNOSIS — E1140 Type 2 diabetes mellitus with diabetic neuropathy, unspecified: Secondary | ICD-10-CM | POA: Insufficient documentation

## 2018-06-20 DIAGNOSIS — Z8249 Family history of ischemic heart disease and other diseases of the circulatory system: Secondary | ICD-10-CM | POA: Insufficient documentation

## 2018-06-20 DIAGNOSIS — I110 Hypertensive heart disease with heart failure: Secondary | ICD-10-CM | POA: Insufficient documentation

## 2018-06-20 DIAGNOSIS — E11621 Type 2 diabetes mellitus with foot ulcer: Secondary | ICD-10-CM | POA: Diagnosis present

## 2018-06-20 DIAGNOSIS — J45909 Unspecified asthma, uncomplicated: Secondary | ICD-10-CM | POA: Diagnosis not present

## 2018-06-20 DIAGNOSIS — L97312 Non-pressure chronic ulcer of right ankle with fat layer exposed: Secondary | ICD-10-CM | POA: Insufficient documentation

## 2018-06-20 DIAGNOSIS — Z885 Allergy status to narcotic agent status: Secondary | ICD-10-CM | POA: Insufficient documentation

## 2018-06-21 NOTE — Progress Notes (Signed)
SHONDALE, SLAVINSKI ([id number]) Visit Report for 06/20/2018 Abuse/Suicide Risk Screen Details Patient Name: Ryan Leonard, Ryan Leonard Date of Service: 06/20/2018 2:30 PM Medical Record Number: [id number] Patient Account Number: [id number] Date of Birth/Sex: 06-Apr-1976 (43 y.o. M) Treating RN: Huel Coventry Primary Care Heather Streeper: PATIENT, NO Other Clinician: Referring Mekisha Bittel: Naoma Diener Treating Nobel Brar/Extender: STONE III, HOYT Weeks in Treatment: 0 Abuse/Suicide Risk Screen Items Answer ABUSE/SUICIDE RISK SCREEN: Has anyone close to you tried to hurt or harm you recentlyo No Do you feel uncomfortable with anyone in your familyo No Has anyone forced you do things that you didnot want to doo No Do you have any thoughts of harming yourselfo No Patient displays signs or symptoms of abuse and/or neglect. No Electronic Signature(s) Signed: 06/20/2018 5:06:25 PM By: Elliot Gurney, BSN, RN, CWS, Kim RN, BSN Entered By: Elliot Gurney, BSN, RN, CWS, Kim on 06/20/2018 15:26:18 [name] ([id number]) -------------------------------------------------------------------------------- Activities of Daily Living Details Patient Name: [name] Date of Service: 06/20/2018 2:30 PM Medical Record Number: [id number] Patient Account Number: [id number] Date of Birth/Sex: February 14, 1976 (43 y.o. M) Treating RN: Huel Coventry Primary Care Kaydan Wilhoite: PATIENT, NO Other Clinician: Referring Gregery Walberg: Naoma Diener Treating Jayah Balthazar/Extender: STONE III, HOYT Weeks in Treatment: 0 Activities of Daily Living Items Answer Activities of Daily Living (Please select one for each item) Drive Automobile Completely Able Take Medications Completely Able Use Telephone Completely Able Care for Appearance Completely Able Use Toilet Completely Able Bath / Shower Completely Able Dress Self Completely Able Feed Self Completely Able Walk Completely Able Get In / Out Bed Completely Able Housework Completely Able Prepare Meals Completely  Able Handle Money Completely Able Shop for Self Completely Able Electronic Signature(s) Signed: 06/20/2018 5:06:25 PM By: Elliot Gurney, BSN, RN, CWS, Kim RN, BSN Entered By: Elliot Gurney, BSN, RN, CWS, Kim on 06/20/2018 15:26:28 [name] ([id number]) -------------------------------------------------------------------------------- Education Assessment Details Patient Name: [name] Date of Service: 06/20/2018 2:30 PM Medical Record Number: [id number] Patient Account Number: [id number] Date of Birth/Sex: Aug 16, 1975 (43 y.o. M) Treating RN: Huel Coventry Primary Care Deejay Koppelman: PATIENT, NO Other Clinician: Referring Gregroy Dombkowski: Naoma Diener Treating Willie Plain/Extender: Linwood Dibbles, HOYT Weeks in Treatment: 0 Learning Preferences/Education Level/Primary Language Highest Education Level: College or Above Preferred Language: English Cognitive Barrier Assessment/Beliefs Language Barrier: No Translator Needed: No Memory Deficit: No Emotional Barrier: No Cultural/Religious Beliefs Affecting Medical Care: No Physical Barrier Assessment Impaired Vision: No Impaired Hearing: No Decreased Hand dexterity: No Knowledge/Comprehension Assessment Knowledge Level: High Comprehension Level: High Ability to understand written High instructions: Ability to understand verbal High instructions: Motivation Assessment Anxiety Level: Calm Cooperation: Cooperative Education Importance: Acknowledges Need Interest in Health Problems: Asks Questions Perception: Coherent Willingness to Engage in Self- High Management Activities: Readiness to Engage in Self- High Management Activities: Electronic Signature(s) Signed: 06/20/2018 5:06:25 PM By: Elliot Gurney, BSN, RN, CWS, Kim RN, BSN Entered By: Elliot Gurney, BSN, RN, CWS, Kim on 06/20/2018 15:26:48 [name] ([id number]) -------------------------------------------------------------------------------- Fall Risk Assessment Details Patient Name: [name] Date of Service: 06/20/2018 2:30 PM Medical Record Number: [id number] Patient Account Number: [id number] Date of Birth/Sex: 1975-09-13 (42 y.o. M) Treating RN: Huel Coventry Primary Care Fotini Lemus: PATIENT, NO Other Clinician: Referring Maiko Salais: Naoma Diener Treating Abran Gavigan/Extender: STONE III, HOYT Weeks in Treatment: 0 Fall Risk Assessment Items Have you had 2 or more falls in the last 12 monthso 0 No Have you had any fall that resulted in injury in the last 12 monthso 0 No FALL RISK ASSESSMENT: History of falling - immediate or within 3 months 0 No Secondary diagnosis 0 No Ambulatory aid  None/bed rest/wheelchair/nurse 0 Yes Crutches/cane/walker 0 No Furniture 0 No IV Access/Saline Lock 0 No Gait/Training Normal/bed rest/immobile 0 Yes Weak 0 No Impaired 0 No Mental Status Oriented to own ability 0 Yes Electronic Signature(s) Signed: 06/20/2018 5:06:25 PM By: Elliot Gurney, BSN, RN, CWS, Kim RN, BSN Entered By: Elliot Gurney, BSN, RN, CWS, Kim on 06/20/2018 15:27:08 [name] ([id number]) -------------------------------------------------------------------------------- Foot Assessment Details Patient Name: [name] Date of Service: 06/20/2018 2:30 PM Medical Record Number: [id number] Patient Account Number: [id number] Date of Birth/Sex: 1976-01-27 (43 y.o. M) Treating RN: Huel Coventry Primary Care Rosslyn Pasion: PATIENT, NO Other Clinician: Referring Siddiq Kaluzny: Naoma Diener Treating Michai Dieppa/Extender: STONE III, HOYT Weeks in Treatment: 0 Foot Assessment Items Site Locations + = Sensation present, - = Sensation absent, C = Callus, U = Ulcer R = Redness, W = Warmth, M = Maceration, PU = Pre-ulcerative lesion F = Fissure, S = Swelling, D = Dryness Assessment Right: Left: Other Deformity: No No Prior Foot Ulcer: No No Prior Amputation: No No Charcot Joint: No No Ambulatory Status: Ambulatory Without Help Gait: Steady Electronic Signature(s) Signed: 06/20/2018 5:06:25 PM  By: Elliot Gurney, BSN, RN, CWS, Kim RN, BSN Entered By: Elliot Gurney, BSN, RN, CWS, Kim on 06/20/2018 15:28:54 [name] ([id number]) -------------------------------------------------------------------------------- Nutrition Risk Assessment Details Patient Name: [name] Date of Service: 06/20/2018 2:30 PM Medical Record Number: [id number] Patient Account Number: [id number] Date of Birth/Sex: 07-Jul-1975 (43 y.o. M) Treating RN: Huel Coventry Primary Care Arvie Bartholomew: PATIENT, NO Other Clinician: Referring Brock Mokry: Naoma Diener Treating Othella Slappey/Extender: STONE III, HOYT Weeks in Treatment: 0 Height (in): 72 Weight (lbs): 255.8 Body Mass Index (BMI): 34.7 Nutrition Risk Assessment Items NUTRITION RISK SCREEN: I have an illness or condition that made me change the kind and/or amount of 0 No food I eat I eat fewer than two meals per day 0 No I eat few fruits and vegetables, or milk products 0 No I have three or more drinks of beer, liquor or wine almost every day 0 No I have tooth or mouth problems that make it hard for me to eat 0 No I don't always have enough money to buy the food I need 0 No I eat alone most of the time 0 No I take three or more different prescribed or over-the-counter drugs a day 1 Yes Without wanting to, I have lost or gained 10 pounds in the last six months 0 No I am not always physically able to shop, cook and/or feed myself 0 No Nutrition Protocols Good Risk Protocol 0 No interventions needed Moderate Risk Protocol Electronic Signature(s) Signed: 06/20/2018 5:06:25 PM By: Elliot Gurney, BSN, RN, CWS, Kim RN, BSN Entered By: Elliot Gurney, BSN, RN, CWS, Kim on 06/20/2018 15:27:34

## 2018-06-21 NOTE — Progress Notes (Signed)
Ryan Leonard, Ryan Leonard ([id number]) Visit Report for 06/20/2018 Chief Complaint Document Details Patient Name: Ryan Leonard, Ryan Leonard Date of Service: 06/20/2018 2:30 PM Medical Record Number: [id number] Patient Account Number: [id number] Date of Birth/Sex: 19-Jun-1975 (43 y.o. M) Treating RN: Arnette Norris Primary Care Provider: PATIENT, NO Other Clinician: Referring Provider: Naoma Diener Treating Provider/Extender: STONE III, Sahas Sluka Weeks in Treatment: 0 Information Obtained from: Patient Chief Complaint He is here in follow up evaluation of right foot ulcers Electronic Signature(s) Signed: 06/20/2018 5:17:02 PM By: Lenda Kelp PA-C Entered By: Lenda Kelp on 06/20/2018 15:54:37 Ryan Leonard ([id number]) -------------------------------------------------------------------------------- Debridement Details Patient Name: Ryan Leonard Date of Service: 06/20/2018 2:30 PM Medical Record Number: [id number] Patient Account Number: [id number] Date of Birth/Sex: 04/21/1976 (43 y.o. M) Treating RN: Curtis Sites Primary Care Provider: PATIENT, NO Other Clinician: Referring Provider: Naoma Diener Treating Provider/Extender: STONE III, Kessa Fairbairn Weeks in Treatment: 0 Debridement Performed for Wound #7 Right,Medial Malleolus Assessment: Performed By: Physician STONE III, Riya Huxford E., PA-C Debridement Type: Debridement Severity of Tissue Pre Fat layer exposed Debridement: Level of Consciousness (Pre- Awake and Alert procedure): Pre-procedure Verification/Time Yes - 15:57 Out Taken: Start Time: 15:57 Pain Control: Lidocaine 4% Topical Solution Total Area Debrided (L x W): 0.5 (cm) x 0.8 (cm) = 0.4 (cm) Tissue and other material Viable, Non-Viable, Slough, Subcutaneous, Fibrin/Exudate, Slough debrided: Level: Skin/Subcutaneous Tissue Debridement Description: Excisional Instrument: Curette Bleeding: Minimum Hemostasis Achieved: Pressure End Time: 16:03 Procedural Pain: 0 Post  Procedural Pain: 0 Response to Treatment: Procedure was tolerated well Level of Consciousness Awake and Alert (Post-procedure): Post Debridement Measurements of Total Wound Length: (cm) 0.5 Width: (cm) 0.8 Depth: (cm) 0.2 Volume: (cm) 0.063 Character of Wound/Ulcer Post Debridement: Improved Severity of Tissue Post Debridement: Fat layer exposed Post Procedure Diagnosis Same as Pre-procedure Electronic Signature(s) Signed: 06/20/2018 5:17:02 PM By: Lenda Kelp PA-C Signed: 06/20/2018 5:22:36 PM By: Curtis Sites Entered By: Curtis Sites on 06/20/2018 16:04:05 Ryan Leonard ([id number]) -------------------------------------------------------------------------------- HPI Details Patient Name: Ryan Leonard Date of Service: 06/20/2018 2:30 PM Medical Record Number: [id number] Patient Account Number: [id number] Date of Birth/Sex: 1976-02-06 (43 y.o. M) Treating RN: Arnette Norris Primary Care Provider: PATIENT, NO Other Clinician: Referring Provider: Naoma Diener Treating Provider/Extender: Linwood Dibbles, Marquise Lambson Weeks in Treatment: 0 History of Present Illness HPI Description: 07/13/17; this is a 43 year old man who is a poorly controlled diabetic with a hemoglobin A1c of over 14. Recently transitioned from oral agents to the addition of long-acting basal insulin. For the last 2 weeks he has had problems with ulcerations on his right foot this includes 2 shallow ulcerations one just distal to the ankle and a smaller one distal to this and a new recent blister just proximal to his toes. Last week his entire foot and lower ankle became intensely swollen and painful he was seen in the emergency room on 07/10/17 at the [facility] clinic in [facility]. Diagnosed with a diabetic foot infection and put on Augmentin. This has helped somewhat with the swelling but he is still having a fair amount of pain and yesterday found it difficult to walk. He has not been systemically  unwell. ABIs in this clinic were 0.88 and on the right 0.7-1 the left. He will definitely need formal arterial studies 07/20/17; my end of the day note on this patient from last week totally neglected to mention wounds over his right second third and fourth PIPs in his right hand which she obtained while trying to punch away ice. We've been using silver alginate to these  areas as well. The area on the fourth PIP is healed. Second is much better. Third still covered by a large amount of adherent nonviable tissue oThe 3 wound areas from last week including the distal blister all look a lot better. Erythema and edema in the foot is better. Culture I did was negative x-ray of the foot I did was negative of the right foot and ankle ARTERIAL STUDIES showed an ABI on the right of 0.73 on the left hip 0.92. However TBIs were normal at 0.75 on the right and 0.73 on the left. Waveforms were triphasic bilaterally at the posterior and anterior tibial. The wounds will have enough blood flow for healing but I thought this should come to the attention of vascular surgeon especially with the reduced ABI on the right vis-o-vis his uncontrolled diabetes. 07/27/17; the area on the second fourth PIP is closed. Third still open but much better. All the wounds on his right foot and ankle look considerably better. Using silver alginate all wound areas. He saw vascular surgery and has an angiogram booked for tomorrow with Dr. Wyn Quakerew. He sees dermatology next week 08/03/17; ANGIOGRAPHY; the patient had angiography on 07/28/17 performed by Dr. dew. He had percutaneous transluminal angioplasty of the right anterior tibial artery. Percutaneous transluminal angioplasty of the right superficial femoral artery with stent placement in the proximal superficial femoral artery. He is still having some pain in his right thigh on the medial aspect. He has not noted any problems in the catheter site in the left femoral. He has 3 wounds 2 on  the or so foot, dorsal ankle and on the anterior tibial area. All of these are measuring smaller 08/10/17; patient arrives with the areas on his hands totally healed. The area on his dorsal foot and dorsal ankle are also healed. He still has one open area on the dorsal right calf just above the ankle which is still open. We've been using silver alginate. 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 08/17/17; the patient is been to see dermatology with regards to the pigmented macules and papules on his bilateral anterior shins and arms. I don't see a specific diagnosis but they did do a biopsy. 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 The remaining wound just above the lateral right ankle. Dimensions are considerably smaller but is still not closed. We've been using silver alginate. 08/24/17-he is here in follow-up evaluation for right foot ulcer. He is healed and will be discharged from the wound care center today Readmission: 06/20/18 patient presents today for initial evaluation here in our clinic concerning issues that he has been having with a wound on the right medial malleolus. Unfortunately he has been in the hospital admitted on 06/05/18 discharge 06/09/18 due to several issues including hypoxia, acute renal failure, hypertensive urgency, and dyspnea. Upon discharge he has been placed on a fluid pill along with his other standard medications including his diabetes medications. No fevers, chills, nausea, or vomiting noted at this time. Ryan Leonard, Ryan Leonard ([id number]) Electronic Signature(s) Signed: 06/20/2018 5:17:02 PM By: Lenda KelpStone III, Layton Naves PA-C Entered By: Lenda KelpStone III, Huey Scalia on 06/20/2018 16:53:59 Ryan Leonard, Ryan Leonard ([id number]) -------------------------------------------------------------------------------- Physical Exam Details Patient Name: Ryan Leonard, Ryan Leonard Date of Service: 06/20/2018 2:30 PM Medical Record  Number: [id number] Patient Account Number: [id number] Date of Birth/Sex: 1975-08-29 (43 y.o. M) Treating RN: Arnette NorrisBiell, Kristina Primary Care Provider: PATIENT, NO Other Clinician: Referring Provider: Naoma DienerKOMIVES, EUGENIE Treating Provider/Extender: Linwood DibblesSTONE III,  Bunny Lowdermilk Weeks in Treatment: 0 Constitutional patient is hypertensive.. pulse regular and within target range for patient.Marland Kitchen respirations regular, non-labored and within target range for patient.Marland Kitchen temperature within target range for patient.. Well-nourished and well-hydrated in no acute distress. Eyes conjunctiva clear no eyelid edema noted. pupils equal round and reactive to light and accommodation. Ears, Nose, Mouth, and Throat no gross abnormality of ear auricles or external auditory canals. normal hearing noted during conversation. mucus membranes moist. Respiratory normal breathing without difficulty. clear to auscultation bilaterally. Cardiovascular regular rate and rhythm with normal S1, S2. 2+ dorsalis pedis/posterior tibialis pulses. no clubbing, cyanosis, significant edema, <3 sec cap refill. Gastrointestinal (GI) soft, non-tender, non-distended, +BS. no ventral hernia noted. Musculoskeletal normal gait and posture. no significant deformity or arthritic changes, no loss or range of motion, no clubbing. Psychiatric this patient is able to make decisions and demonstrates good insight into disease process. Alert and Oriented x 3. pleasant and cooperative. Notes On inspection today patient's wound did have some necrotic material noted on the surface of the wound. This was sharply debride away today without complication and post debridement the wound bed appears to be doing significantly better which is great news. Overall it does not appear to be large wound nor a deep wound and I think this will likely heal fairly well. He seems to have good blood flow into his lower extremity. Electronic Signature(s) Signed: 06/20/2018 5:17:02 PM By:  Lenda Kelp PA-C Entered By: Lenda Kelp on 06/20/2018 16:56:29 Ryan Leonard ([id number]) -------------------------------------------------------------------------------- Physician Orders Details Patient Name: Ryan Leonard Date of Service: 06/20/2018 2:30 PM Medical Record Number: [id number] Patient Account Number: [id number] Date of Birth/Sex: 03/18/1976 (43 y.o. M) Treating RN: Curtis Sites Primary Care Provider: PATIENT, NO Other Clinician: Referring Provider: Naoma Diener Treating Provider/Extender: STONE III, Xane Amsden Weeks in Treatment: 0 Verbal / Phone Orders: No Diagnosis Coding ICD-10 Coding Code Description E11.621 Type 2 diabetes mellitus with foot ulcer L97.312 Non-pressure chronic ulcer of right ankle with fat layer exposed I10 Essential (primary) hypertension N17.9 Acute kidney failure, unspecified I73.89 Other specified peripheral vascular diseases Wound Cleansing Wound #7 Right,Medial Malleolus o Clean wound with Normal Saline. o May Shower, gently pat wound dry prior to applying new dressing. Anesthetic (add to Medication List) Wound #7 Right,Medial Malleolus o Topical Lidocaine 4% cream applied to wound bed prior to debridement (In Clinic Only). Primary Wound Dressing Wound #7 Right,Medial Malleolus o Silver Collagen Secondary Dressing Wound #7 Right,Medial Malleolus o Boardered Foam Dressing Dressing Change Frequency Wound #7 Right,Medial Malleolus o Change dressing every other day. Follow-up Appointments Wound #7 Right,Medial Malleolus o Return Appointment in 1 week. Edema Control Wound #7 Right,Medial Malleolus o Elevate legs to the level of the heart and pump ankles as often as possible Electronic Signature(s) Signed: 06/20/2018 5:17:02 PM By: Thurmond Butts, Delmon ([id number]) Signed: 06/20/2018 5:22:36 PM By: Curtis Sites Entered By: Curtis Sites on 06/20/2018 16:06:26 Ryan Leonard  ([id number]) -------------------------------------------------------------------------------- Problem List Details Patient Name: Ryan Leonard Date of Service: 06/20/2018 2:30 PM Medical Record Number: [id number] Patient Account Number: [id number] Date of Birth/Sex: [birth year] (43 y.o. M) Treating RN: Arnette Norris Primary Care Provider: PATIENT, NO Other Clinician: Referring Provider: Naoma Diener Treating Provider/Extender: Linwood Dibbles, Tiaja Hagan Weeks in Treatment: 0 Active Problems ICD-10 Evaluated Encounter Code Description Active Date Today Diagnosis E11.621 Type 2 diabetes mellitus with foot ulcer 06/20/2018 No Yes L97.312 Non-pressure chronic ulcer of right ankle with fat layer 06/20/2018 No Yes exposed I10 Essential (primary) hypertension 06/20/2018 No  Yes N17.9 Acute kidney failure, unspecified 06/20/2018 No Yes I73.89 Other specified peripheral vascular diseases 06/20/2018 No Yes Inactive Problems Resolved Problems Electronic Signature(s) Signed: 06/20/2018 5:17:02 PM By: Lenda KelpStone III, Iqra Rotundo PA-C Entered By: Lenda KelpStone III, Debralee Braaksma on 06/20/2018 15:54:29 Ryan Leonard, Deuntae ([id number]) -------------------------------------------------------------------------------- Progress Note Details Patient Name: Ryan Leonard, Ryan Leonard Date of Service: 06/20/2018 2:30 PM Medical Record Number: [id number] Patient Account Number: [id number] Date of Birth/Sex: 1976/04/06 (43 y.o. M) Treating RN: Arnette NorrisBiell, Kristina Primary Care Provider: PATIENT, NO Other Clinician: Referring Provider: Naoma DienerKOMIVES, EUGENIE Treating Provider/Extender: Linwood DibblesSTONE III, Nari Vannatter Weeks in Treatment: 0 Subjective Chief Complaint Information obtained from Patient He is here in follow up evaluation of right foot ulcers History of Present Illness (HPI) 07/13/17; this is a 43 year old man who is a poorly controlled diabetic with a hemoglobin A1c of over 14. Recently transitioned from oral agents to the addition of long-acting basal insulin. For the last 2  weeks he has had problems with ulcerations on his right foot this includes 2 shallow ulcerations one just distal to the ankle and a smaller one distal to this and a new recent blister just proximal to his toes. Last week his entire foot and lower ankle became intensely swollen and painful he was seen in [facility]. Diagnosed with a diabetic foot infection and put on Augmentin. This has helped somewhat with the swelling but he is still having a fair amount of pain and yesterday found it difficult to walk. He has not been systemically unwell. ABIs in this clinic were 0.88 and on the right 0.7-1 the left. He will definitely need formal arterial studies 07/20/17; my end of the day note on this patient from last week totally neglected to mention wounds over his right second third and fourth PIPs in his right hand which she obtained while trying to punch away ice. We've been using silver alginate to these areas as well. The area on the fourth PIP is healed. Second is much better. Third still covered by a large amount of adherent nonviable tissue The 3 wound areas from last week including the distal blister all look a lot better. Erythema and edema in the foot is better. Culture I did was negative x-ray of the foot I did was negative of the right foot and ankle ARTERIAL STUDIES showed an ABI on the right of 0.73 on the left hip 0.92. However TBIs were normal at 0.75 on the right and 0.73 on the left. Waveforms were triphasic bilaterally at the posterior and anterior tibial. The wounds will have enough blood flow for healing but I thought this should come to the attention of vascular surgeon especially with the reduced ABI on the right vis--vis his uncontrolled diabetes. 07/27/17; the area on the second fourth PIP is closed. Third still open but much better. All the wounds on his right foot and ankle look considerably better. Using silver  alginate all wound areas. He saw vascular surgery and has an angiogram booked for tomorrow with Dr. Wyn Quakerew. He sees dermatology next week 08/03/17; ANGIOGRAPHY; the patient had angiography on 07/28/17 performed by Dr. dew. He had percutaneous transluminal angioplasty of the right anterior tibial artery. Percutaneous transluminal angioplasty of the right superficial femoral artery with stent placement in the proximal superficial femoral artery. He is still having some pain in his right thigh on the medial aspect. He has not noted any problems in the catheter site in the left femoral. He has 3 wounds 2  on the or so foot, dorsal ankle and on the anterior tibial area. All of these are measuring smaller 08/10/17; patient arrives with the areas on his hands totally healed. The area on his dorsal foot and dorsal ankle are also healed. He still has one open area on the dorsal right calf just above the ankle which is still open. We've been using silver alginate. 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 08/17/17; the patient is been to see dermatology with regards to the pigmented macules and papules on his bilateral anterior shins and arms. I don't see a specific diagnosis but they did do a biopsy. 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 The remaining wound just above the lateral right ankle. Dimensions are considerably smaller but is still not closed. We've been using silver alginate. 08/24/17-he is here in follow-up evaluation for right foot ulcer. He is healed and will be discharged from the wound care center today Readmission: MILT, COYE ([id number]) 06/20/18 patient presents today for initial evaluation here in our clinic concerning issues that he has been having with a wound on the right medial malleolus. Unfortunately he has been in the hospital admitted on 06/05/18 discharge 06/09/18 due to several issues  including hypoxia, acute renal failure, hypertensive urgency, and dyspnea. Upon discharge he has been placed on a fluid pill along with his other standard medications including his diabetes medications. No fevers, chills, nausea, or vomiting noted at this time. Wound History Patient presents with 1 open wound that has been present for approximately 3 months. Patient has been treating wound in the following manner: silvercell. Laboratory tests have been performed in the last month. Patient reportedly has not tested positive for an antibiotic resistant organism. Patient reportedly has not tested positive for osteomyelitis. Patient reportedly has had testing performed to evaluate circulation in the legs. Patient experiences the following problems associated with their wounds: infection. Patient History Information obtained from Patient. Allergies codeine Family History Cancer - Maternal Grandparents, Diabetes - Maternal Grandparents, Hypertension - Father, No family history of Heart Disease, Hereditary Spherocytosis, Kidney Disease, Lung Disease, Seizures, Stroke, Thyroid Problems, Tuberculosis. Social History Never smoker, Marital Status - Married, Alcohol Use - Never, Drug Use - No History, Caffeine Use - Daily. Medical History Eyes Denies history of Cataracts, Glaucoma, Optic Neuritis Ear/Nose/Mouth/Throat Denies history of Chronic sinus problems/congestion, Middle ear problems Hematologic/Lymphatic Denies history of Anemia, Hemophilia, Human Immunodeficiency Virus, Lymphedema, Sickle Cell Disease Respiratory Denies history of Aspiration, Chronic Obstructive Pulmonary Disease (COPD), Pneumothorax, Sleep Apnea, Tuberculosis Cardiovascular Patient has history of Congestive Heart Failure, Myocardial Infarction Denies history of Angina, Arrhythmia, Coronary Artery Disease, Deep Vein Thrombosis, Hypotension, Peripheral Arterial Disease, Peripheral Venous Disease, Phlebitis,  Vasculitis Gastrointestinal Denies history of Cirrhosis , Colitis, Crohn s, Hepatitis A, Hepatitis B, Hepatitis C Genitourinary Denies history of End Stage Renal Disease Immunological Denies history of Lupus Erythematosus, Raynaud s, Scleroderma Integumentary (Skin) Denies history of History of Burn, History of pressure wounds Musculoskeletal Denies history of Gout, Rheumatoid Arthritis, Osteoarthritis, Osteomyelitis Neurologic Denies history of Dementia, Quadriplegia, Paraplegia, Seizure Disorder Oncologic Denies history of Received Chemotherapy, Received Radiation Psychiatric Denies history of Baruch Gouty Confinement Anxiety KUTTER, SCHNEPF ([id number]) Hospitalization/Surgery History - 06/09/2018, [facility], Fluid overload. Medical And Surgical History Notes Respiratory Fluid in lungs Review of Systems (ROS) Constitutional Symptoms (General Health) The patient has no complaints or symptoms. Eyes The patient has no complaints or symptoms. Ear/Nose/Mouth/Throat The patient has no complaints or  symptoms. Hematologic/Lymphatic The patient has no complaints or symptoms. Respiratory The patient has no complaints or symptoms. Cardiovascular Complains or has symptoms of LE edema. Denies complaints or symptoms of Chest pain. Gastrointestinal The patient has no complaints or symptoms. Endocrine The patient has no complaints or symptoms. Genitourinary The patient has no complaints or symptoms. Immunological The patient has no complaints or symptoms. Integumentary (Skin) Complains or has symptoms of Wounds. Denies complaints or symptoms of Bleeding or bruising tendency, Breakdown, Swelling. Musculoskeletal Complains or has symptoms of Muscle Pain, Muscle Weakness. Neurologic Complains or has symptoms of Numbness/parasthesias - Feet. Denies complaints or symptoms of Focal/Weakness. Oncologic The patient has no complaints or symptoms. Psychiatric The patient has no  complaints or symptoms. Objective Constitutional patient is hypertensive.. pulse regular and within target range for patient.Marland Kitchen respirations regular, non-labored and within target range for patient.Marland Kitchen temperature within target range for patient.. Well-nourished and well-hydrated in no acute distress. Vitals Time Taken: 3:16 PM, Height: 72 in, Weight: 255.8 lbs, Source: Measured, BMI: 34.7, Temperature: 98.2 F, Pulse: 78 bpm, Respiratory Rate: 16 breaths/min, Blood Pressure: 169/80 mmHg. [place], Arlys John ([id number]) Eyes conjunctiva clear no eyelid edema noted. pupils equal round and reactive to light and accommodation. Ears, Nose, Mouth, and Throat no gross abnormality of ear auricles or external auditory canals. normal hearing noted during conversation. mucus membranes moist. Respiratory normal breathing without difficulty. clear to auscultation bilaterally. Cardiovascular regular rate and rhythm with normal S1, S2. 2+ dorsalis pedis/posterior tibialis pulses. no clubbing, cyanosis, significant edema, Gastrointestinal (GI) soft, non-tender, non-distended, +BS. no ventral hernia noted. Musculoskeletal normal gait and posture. no significant deformity or arthritic changes, no loss or range of motion, no clubbing. Psychiatric this patient is able to make decisions and demonstrates good insight into disease process. Alert and Oriented x 3. pleasant and cooperative. General Notes: On inspection today patient's wound did have some necrotic material noted on the surface of the wound. This was sharply debride away today without complication and post debridement the wound bed appears to be doing significantly better which is great news. Overall it does not appear to be large wound nor a deep wound and I think this will likely heal fairly well. He seems to have good blood flow into his lower extremity. Integumentary (Hair, Skin) Wound #7 status is Open. Original cause of wound was Blister. The  wound is located on the Right,Medial Malleolus. The wound measures 0.5cm length x 0.8cm width x 0.1cm depth; 0.314cm^2 area and 0.031cm^3 volume. There is Fat Layer (Subcutaneous Tissue) Exposed exposed. There is no tunneling or undermining noted. There is a medium amount of serous drainage noted. The wound margin is flat and intact. There is no granulation within the wound bed. There is a large (67-100%) amount of necrotic tissue within the wound bed including Eschar and Adherent Slough. The periwound skin appearance did not exhibit: Callus, Crepitus, Excoriation, Induration, Rash, Scarring, Dry/Scaly, Maceration, Atrophie Blanche, Cyanosis, Ecchymosis, Hemosiderin Staining, Mottled, Pallor, Rubor, Erythema. 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 There was a Excisional Skin/Subcutaneous Tissue Debridement with a total area of 0.4 sq cm performed by STONE III, Demerius Podolak E., PA-C. With the following instrument(s):  Curette to remove Viable and Non-Viable tissue/material. Material removed includes Subcutaneous Tissue, Slough, and Fibrin/Exudate after achieving pain control using Lidocaine 4% Topical Solution. No specimens were taken. A time out was conducted at 15:57, prior to the start of the procedure. A Minimum amount of bleeding was controlled with Pressure. The procedure was tolerated well with a pain level of 0 throughout and a pain level of 0 following the procedure. Post Debridement Measurements: 0.5cm length x 0.8cm width x 0.2cm depth; 0.063cm^3  volume. Character of Wound/Ulcer Post Debridement is improved. Severity of Tissue Post Debridement is: Fat layer exposed. Post procedure Diagnosis Wound #7: Same as Pre-Procedure Plan Wound Cleansing: Wound #7 Right,Medial Malleolus: Clean wound with Normal Saline. May Shower, gently pat wound dry prior to applying new dressing. Anesthetic (add to Medication List): Wound #7 Right,Medial Malleolus: Topical Lidocaine 4% cream applied to wound bed prior to debridement (In Clinic Only). Primary Wound Dressing: Wound #7 Right,Medial Malleolus: Silver Collagen Secondary Dressing: Wound #7 Right,Medial Malleolus: Boardered Foam Dressing Dressing Change Frequency: Wound #7 Right,Medial Malleolus: Change dressing every other day. Follow-up Appointments: Wound #7 Right,Medial Malleolus: Return Appointment in 1 week. Edema Control: Wound #7 Right,Medial Malleolus: Elevate legs to the level of the heart and pump ankles as often as possible At this point my suggestion is gonna be that we initiate treatment per above with [facility] patients in agreement that plan. Subsequently we're gonna see were things that at follow-up. If anything changes or worsens he will contact the office and let me know. Please see above for specific wound care orders. We will see patient for re-evaluation in 1 week(s) here in the clinic. If anything worsens or changes patient will contact our office for additional recommendations. Electronic Signature(s) Signed: 06/20/2018 5:17:02 PM By: Lenda Kelp PA-C Entered By: Lenda Kelp on 06/20/2018 16:56:54 Ryan Leonard ([id number]) DEVANSH, RIESE ([id number]) -------------------------------------------------------------------------------- ROS/PFSH Details Patient Name: Ryan Leonard Date of Service: 06/20/2018 2:30 PM Medical Record Number: [id number] Patient Account Number: [id number] Date of Birth/Sex: [birth year] (43 y.o. M) Treating RN: Huel Coventry Primary  Care Provider: PATIENT, NO Other Clinician: Referring Provider: Naoma Diener Treating Provider/Extender: STONE III, Nisa Decaire Weeks in Treatment: 0 Information Obtained From Patient Wound History Do you currently have one or more open woundso Yes How many open wounds do you currently haveo 1 Approximately how long have you had your woundso 3 months How have you been treating your wound(s) until nowo [facility]cell Has your wound(s) ever healed and then re-openedo No Have you had any lab work done in the past montho Yes Have you tested positive for an antibiotic resistant organism (MRSA, VRE)o No Have you tested positive for osteomyelitis (bone infection)o No Have you had any tests for circulation on your legso Yes Who ordered the testo Dew Where was the test doneo [facility] Have you had other problems associated with your woundso Infection Cardiovascular Complaints and Symptoms: Positive for: LE edema Negative for: Chest pain Medical History: Positive for: Congestive Heart Failure; Hypertension; Myocardial Infarction Negative for: Angina; Arrhythmia; Coronary Artery Disease; Deep Vein Thrombosis; Hypotension; Peripheral Arterial Disease; Peripheral Venous Disease; Phlebitis; Vasculitis Genitourinary Complaints and Symptoms: No Complaints or Symptoms Complaints and Symptoms: Negative for: Kidney failure/ Dialysis; Incontinence/dribbling Medical History: Negative for: End Stage Renal Disease Integumentary (Skin) Complaints and Symptoms: Positive for: Wounds Negative for: Bleeding or bruising tendency; Breakdown; Swelling Medical History: Negative for: History of Burn; History of pressure wounds Musculoskeletal GWEN, EDLER ([id number]) Complaints and Symptoms: Positive for: Muscle Pain; Muscle Weakness Medical  History: Negative for: Gout; Rheumatoid Arthritis; Osteoarthritis; Osteomyelitis Neurologic Complaints and Symptoms: Positive for: Numbness/parasthesias - Feet Negative  for: Focal/Weakness Medical History: Positive for: Neuropathy Negative for: Dementia; Quadriplegia; Paraplegia; Seizure Disorder Constitutional Symptoms (General Health) Complaints and Symptoms: No Complaints or Symptoms Eyes Complaints and Symptoms: No Complaints or Symptoms Medical History: Negative for: Cataracts; Glaucoma; Optic Neuritis Ear/Nose/Mouth/Throat Complaints and Symptoms: No Complaints or Symptoms Medical History: Negative for: Chronic sinus problems/congestion; Middle ear problems Hematologic/Lymphatic Complaints and Symptoms: No Complaints or Symptoms Medical History: Negative for: Anemia; Hemophilia; Human Immunodeficiency Virus; Lymphedema; Sickle Cell Disease Respiratory Complaints and Symptoms: No Complaints or Symptoms Medical History: Positive for: Asthma Negative for: Aspiration; Chronic Obstructive Pulmonary Disease (COPD); Pneumothorax; Sleep Apnea; Tuberculosis Past Medical History Notes: Fluid in lungs Gastrointestinal Complaints and Symptoms: No Complaints or Symptoms BARNARD, SHARPS ([id number]) Medical History: Negative for: Cirrhosis ; Colitis; Crohnos; Hepatitis A; Hepatitis B; Hepatitis C Endocrine Complaints and Symptoms: No Complaints or Symptoms Medical History: Positive for: Type II Diabetes Time with diabetes: 11 yrs Treated with: Insulin Blood sugar tested every day: Yes Tested : 2x day Immunological Complaints and Symptoms: No Complaints or Symptoms Medical History: Negative for: Lupus Erythematosus; Raynaudos; Scleroderma Oncologic Complaints and Symptoms: No Complaints or Symptoms Medical History: Negative for: Received Chemotherapy; Received Radiation Psychiatric Complaints and Symptoms: No Complaints or Symptoms Medical History: Negative for: Anorexia/bulimia; Confinement Anxiety Immunizations Pneumococcal Vaccine: Received Pneumococcal Vaccination: No Implantable Devices Hospitalization / Surgery  History Name of Hospital Purpose of Hospitalization/Surgery Date [facility] Fluid overload 06/09/2018 Family and Social History Cancer: Yes - Maternal Grandparents; Diabetes: Yes - Maternal Grandparents; Heart Disease: No; Hereditary Spherocytosis: No; Hypertension: Yes - Father; Kidney Disease: No; Lung Disease: No; Seizures: No; Stroke: No; Thyroid Problems: No; Tuberculosis: No; Never smoker; Marital Status - Married; Alcohol Use: Never; Drug Use: No History; Caffeine Use: Daily; Financial Concerns: No; Food, Clothing or Shelter Needs: No; Support System Lacking: No; Transportation Concerns: No; Advanced Directives: No; Patient does not want information on Advanced Directives; Do not resuscitate: No; Living Will: Yes (Not Provided); Medical Power of Attorney: No HANDSOME, ANGLIN ([id number]) Electronic Signature(s) Signed: 06/20/2018 5:06:25 PM By: Elliot Gurney BSN, RN, CWS, Kim RN, BSN Signed: 06/20/2018 5:17:02 PM By: Lenda Kelp PA-C Entered By: Elliot Gurney BSN, RN, CWS, Kim on 06/20/2018 15:26:10 Ryan Leonard ([id number]) -------------------------------------------------------------------------------- SuperBill Details Patient Name: Ryan Leonard Date of Service: 06/20/2018 Medical Record Number: [id number] Patient Account Number: [id number] Date of Birth/Sex: [birth year] (43 y.o. M) Treating RN: Arnette Norris Primary Care Provider: PATIENT, NO Other Clinician: Referring Provider: Naoma Diener Treating Provider/Extender: STONE III, Elden Brucato Weeks in Treatment: 0 Diagnosis Coding ICD-10 Codes Code Description E11.621 Type 2 diabetes mellitus with foot ulcer L97.312 Non-pressure chronic ulcer of right ankle with fat layer exposed I10 Essential (primary) hypertension N17.9 Acute kidney failure, unspecified I73.89 Other specified peripheral vascular diseases Facility Procedures CPT4 Code: 62952841 Description: 99213 - WOUND CARE VISIT-LEV 3 EST PT Modifier: Quantity: 1 CPT4 Code:  32440102 Description: 11042 - DEB SUBQ TISSUE 20 SQ CM/< ICD-10 Diagnosis Description L97.312 Non-pressure chronic ulcer of right ankle with fat layer ex Modifier: posed Quantity: 1 Physician Procedures CPT4 Code: 7253664 Description: 99214 - WC PHYS LEVEL 4 - EST PT ICD-10 Diagnosis Description E11.621 Type 2 diabetes mellitus with foot ulcer L97.312 Non-pressure chronic ulcer of right ankle with fat layer ex I10 Essential (primary) hypertension N17.9 Acute kidney  failure, unspecified Modifier: 25 posed Quantity: 1 CPT4 Code: [id number] Description: 11042 - WC PHYS SUBQ TISS 20 SQ CM ICD-10  Diagnosis Description L97.312 Non-pressure chronic ulcer of right ankle with fat layer ex Modifier: posed Quantity: 1 Electronic Signature(s) Signed: 06/20/2018 5:17:02 PM By: Lenda Kelp PA-C Entered By: Lenda Kelp on 06/20/2018 16:57:14

## 2018-06-21 NOTE — Progress Notes (Addendum)
TRENTYN, EKHOLM ([id number]) Visit Report for 06/20/2018 Allergy List Details Patient Name: Ryan Leonard, Ryan Leonard Date of Service: 06/20/2018 2:30 PM Medical Record Number: [id number] Patient Account Number: [id number] Date of Birth/Sex: 20-Jul-1975 (43 y.o. M) Treating RN: Huel Coventry Primary Care Minah Axelrod: PATIENT, NO Other Clinician: Referring Hays Dunnigan: Naoma Diener Treating Anna Livers/Extender: STONE III, HOYT Weeks in Treatment: 0 Allergies Active Allergies codeine Allergy Notes Electronic Signature(s) Signed: 06/20/2018 5:06:25 PM By: Elliot Gurney, BSN, RN, CWS, Kim RN, BSN Entered By: Elliot Gurney, BSN, RN, CWS, Kim on 06/20/2018 15:18:11 Ryan Leonard ([id number]) -------------------------------------------------------------------------------- Arrival Information Details Patient Name: Ryan Leonard Date of Service: 06/20/2018 2:30 PM Medical Record Number: [id number] Patient Account Number: [id number] Date of Birth/Sex: 06-Feb-1976 (43 y.o. M) Treating RN: Huel Coventry Primary Care Jil Penland: PATIENT, NO Other Clinician: Referring Silveria Botz: Naoma Diener Treating Humphrey Guerreiro/Extender: Linwood Dibbles, HOYT Weeks in Treatment: 0 Visit Information Patient Arrived: Ambulatory Arrival Time: 15:14 Accompanied By: self Transfer Assistance: None Patient Identification Verified: Yes Secondary Verification Process Completed: Yes Patient Has Alerts: Yes Patient Alerts: Type II Diabetic 81 mg Aspirin History Since Last Visit Electronic Signature(s) Signed: 06/20/2018 5:06:25 PM By: Elliot Gurney, BSN, RN, CWS, Kim RN, BSN Entered By: Elliot Gurney, BSN, RN, CWS, Kim on 06/20/2018 15:16:07 Ryan Leonard ([id number]) -------------------------------------------------------------------------------- Clinic Level of Care Assessment Details Patient Name: Ryan Leonard Date of Service: 06/20/2018 2:30 PM Medical Record Number: [id number] Patient Account Number: [id number] Date of Birth/Sex: [birth year] (43 y.o. M) Treating RN:  Curtis Sites Primary Care Ulah Olmo: PATIENT, NO Other Clinician: Referring Elienai Gailey: Naoma Diener Treating Temekia Caskey/Extender: STONE III, HOYT Weeks in Treatment: 0 Clinic Level of Care Assessment Items TOOL 1 Quantity Score []  - Use when EandM and Procedure is performed on INITIAL visit 0 ASSESSMENTS - Nursing Assessment / Reassessment X - General Physical Exam (combine w/ comprehensive assessment (listed just below) when 1 20 performed on new pt. evals) X- 1 25 Comprehensive Assessment (HX, ROS, Risk Assessments, Wounds Hx, etc.) ASSESSMENTS - Wound and Skin Assessment / Reassessment []  - Dermatologic / Skin Assessment (not related to wound area) 0 ASSESSMENTS - Ostomy and/or Continence Assessment and Care []  - Incontinence Assessment and Management 0 []  - 0 Ostomy Care Assessment and Management (repouching, etc.) PROCESS - Coordination of Care X - Simple Patient / Family Education for ongoing care 1 15 []  - 0 Complex (extensive) Patient / Family Education for ongoing care X- 1 10 Staff obtains Chiropractor, Records, Test Results / Process Orders []  - 0 Staff telephones HHA, Nursing Homes / Clarify orders / etc []  - 0 Routine Transfer to another Facility (non-emergent condition) []  - 0 Routine Hospital Admission (non-emergent condition) X- 1 15 New Admissions / Manufacturing engineer / Ordering NPWT, Apligraf, etc. []  - 0 Emergency Hospital Admission (emergent condition) PROCESS - Special Needs []  - Pediatric / Minor Patient Management 0 []  - 0 Isolation Patient Management []  - 0 Hearing / Language / Visual special needs []  - 0 Assessment of Community assistance (transportation, D/C planning, etc.) []  - 0 Additional assistance / Altered mentation []  - 0 Support Surface(s) Assessment (bed, cushion, seat, etc.) DUEY, BEHRMAN ([id number]) INTERVENTIONS - Miscellaneous []  - External ear exam 0 []  - 0 Patient Transfer (multiple staff / Nurse, adult / Similar  devices) []  - 0 Simple Staple / Suture removal (25 or less) []  - 0 Complex Staple / Suture removal (26 or more) []  - 0 Hypo/Hyperglycemic Management (do not check if billed separately) X- 1 15 Ankle / Brachial Index (ABI) - do not check if billed separately Has the  patient been seen at the hospital within the last three years: Yes Total Score: 100 Level Of Care: New/Established - Level 3 Electronic Signature(s) Signed: 06/20/2018 5:22:36 PM By: Curtis Sitesorthy, Joanna Entered By: Curtis Sitesorthy, Joanna on 06/20/2018 16:23:44 Ryan Leonard, Ryan Leonard ([id number]) -------------------------------------------------------------------------------- Encounter Discharge Information Details Patient Name: Ryan Leonard, Ryan Leonard Date of Service: 06/20/2018 2:30 PM Medical Record Number: [id number] Patient Account Number: [id number] Date of Birth/Sex: 10-20-1975 (43 y.o. M) Treating RN: Huel CoventryWoody, Kim Primary Care Jceon Alverio: PATIENT, NO Other Clinician: Referring Faye Sanfilippo: Naoma DienerKOMIVES, EUGENIE Treating Roxi Hlavaty/Extender: STONE III, HOYT Weeks in Treatment: 0 Encounter Discharge Information Items Post Procedure Vitals Discharge Condition: Stable Temperature (F): 98.1 Ambulatory Status: Ambulatory Pulse (bpm): 78 Discharge Destination: Home Respiratory Rate (breaths/min): 16 Transportation: Private Auto Blood Pressure (mmHg): 169/80 Accompanied By: self Schedule Follow-up Appointment: Yes Clinical Summary of Care: Provided Form Type Recipient Paper Patient bw Electronic Signature(s) Signed: 06/20/2018 5:02:52 PM By: Dayton MartesWallace, RCP,RRT,CHT, Sallie RCP, RRT, CHT Entered By: Dayton MartesWallace, RCP,RRT,CHT, Sallie on 06/20/2018 16:20:34 Ryan Leonard, Ryan Leonard ([id number]) -------------------------------------------------------------------------------- Lower Extremity Assessment Details Patient Name: Ryan Leonard, Ryan Leonard Date of Service: 06/20/2018 2:30 PM Medical Record Number: [id number] Patient Account Number: [id number] Date of Birth/Sex: 10-20-1975 (43  y.o. M) Treating RN: Huel CoventryWoody, Kim Primary Care Arlicia Paquette: PATIENT, NO Other Clinician: Referring Swayze Kozuch: Naoma DienerKOMIVES, EUGENIE Treating Robbert Langlinais/Extender: STONE III, HOYT Weeks in Treatment: 0 Edema Assessment Assessed: [Left: No] [Right: No] [Left: Edema] [Right: :] Ankle Left: Right: Point of Measurement: 11 cm From Medial Instep 29 cm 29.5 cm Vascular Assessment Claudication: Claudication Assessment [Left:Intermittent] [Right:Intermittent] Pulses: Dorsalis Pedis Palpable: [Left:Yes] [Right:Yes] Doppler Audible: [Left:Yes] [Right:Yes] Posterior Tibial Palpable: [Left:Yes] [Right:Yes] Doppler Audible: [Left:Yes] [Right:Yes] Extremity colors, hair growth, and conditions: Extremity Color: [Left:Hyperpigmented] [Right:Hyperpigmented] Hair Growth on Extremity: [Left:Yes] [Right:Yes] Temperature of Extremity: [Left:Warm] [Right:Warm] Capillary Refill: [Left:< 3 seconds] [Right:< 3 seconds] Toe Nail Assessment Left: Right: Thick: No No Discolored: No No Deformed: No No Improper Length and Hygiene: No No Notes Patient has many small scattered brown spots on legs bilaterally. Electronic Signature(s) Signed: 06/20/2018 5:06:25 PM By: Elliot GurneyWoody, BSN, RN, CWS, Kim RN, BSN Entered By: Elliot GurneyWoody, BSN, RN, CWS, Kim on 06/20/2018 15:38:13 Ryan Leonard, Ryan Leonard ([id number]) -------------------------------------------------------------------------------- Multi Wound Chart Details Patient Name: Ryan Leonard, Ryan Leonard Date of Service: 06/20/2018 2:30 PM Medical Record Number: [id number] Patient Account Number: [id number] Date of Birth/Sex: 10-20-1975 (42 y.o. 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] [N/A:N/A] Comorbid History: [7:Asthma, Congestive Heart Failure, Hypertension, Myocardial Infarction, Type II Diabetes, Neuropathy] [N/A:N/A] Date Acquired: [7:03/20/2018] [N/A:N/A] Weeks of Treatment: [7:0] [N/A:N/A] Wound Status: [7:Open] [N/A:N/A] Measurements L x W x D [7:5x8x0.1] [N/A:N/A] (cm) Area (cm) : [7:31.416] [N/A:N/A] Volume (cm) : [7:3.142] [N/A:N/A] Classification: [7:Grade 2] [N/A:N/A] Exudate Amount: [7:Medium] [N/A:N/A] Exudate Type: [7:Serous] [N/A:N/A] Exudate Color: [7:amber] [N/A:N/A] Wound Margin: [7:Flat and Intact] [N/A:N/A] Granulation Amount: [7:None Present (0%)] [N/A:N/A] Necrotic Amount: [7:Large (67-100%)] [N/A:N/A] Necrotic Tissue: [7:Eschar, Adherent Slough] [N/A:N/A] Exposed Structures: [7:Fat Layer (Subcutaneous Tissue) Exposed: Yes Fascia: No Tendon: No Muscle: No Joint: No Bone: No] [N/A:N/A] Epithelialization: [7:None] [N/A:N/A] Periwound Skin Texture: [7:Excoriation: No Induration: No Callus: No Crepitus: No] [N/A:N/A] Rash: No Scarring: No Periwound Skin Moisture: Maceration: No N/A N/A Dry/Scaly: No Periwound Skin Color: Atrophie  Blanche: No N/A N/A Cyanosis: No Ecchymosis: No Erythema: No Hemosiderin Staining: No Mottled: No Pallor: No Rubor: No Tenderness on Palpation: No N/A N/A Wound Preparation: Ulcer Cleansing: N/A N/A Rinsed/Irrigated with Saline Topical Anesthetic Applied: Other: lidocaine 4% Treatment Notes Electronic Signature(s) Signed: 06/20/2018 5:22:36 PM By: Curtis Sites Entered By: Curtis Sites on 06/20/2018 15:58:56 Ryan Leonard ([id number]) -------------------------------------------------------------------------------- Multi-Disciplinary Care Plan Details Patient Name: Ryan Leonard Date of Service: 06/20/2018 2:30 PM Medical Record Number: [id number] Patient Account Number: [id number] Date of Birth/Sex: 1975-10-30 (43 y.o. M) Treating RN: Curtis Sites Primary Care Jveon Pound: PATIENT, NO Other Clinician: Referring Elnore Cosens: Naoma Diener Treating Nyra Anspaugh/Extender: Linwood Dibbles, HOYT Weeks in Treatment: 0 Active Inactive Electronic Signature(s) Signed: 07/24/2018 12:59:54 PM By: Elliot Gurney, BSN, RN, CWS, Kim RN, BSN Signed: 08/14/2018 10:45:06 AM By: Curtis Sites Previous Signature: 06/20/2018 5:22:36 PM Version By: Curtis Sites Entered By: Elliot Gurney BSN, RN, CWS, Kim on 07/24/2018 12:59:54 Ryan Leonard ([id number]) -------------------------------------------------------------------------------- Pain Assessment Details Patient Name: Ryan Leonard Date of Service: 06/20/2018 2:30 PM Medical Record Number: [id number] Patient Account Number: [id number] Date of Birth/Sex: 1976-03-10 (43 y.o. M) Treating RN: Huel Coventry Primary Care Dearl Rudden: PATIENT, NO Other Clinician: Referring Cornie Mccomber: Naoma Diener Treating Seren Chaloux/Extender: STONE III, HOYT Weeks in Treatment: 0 Active Problems Location of Pain Severity and Description of Pain Patient Has Paino No Site Locations Pain Management and Medication Current Pain Management: Electronic Signature(s) Signed: 06/20/2018 5:06:25 PM By: Elliot Gurney, BSN, RN, CWS, Kim RN, BSN Entered By: Elliot Gurney, BSN, RN, CWS, Kim on 06/20/2018 15:16:12 Ryan Leonard ([id number]) -------------------------------------------------------------------------------- Patient/Caregiver Education Details Patient Name: Ryan Leonard Date of Service: 06/20/2018 2:30 PM Medical Record Number: [id number] Patient Account Number: [id number] Date of Birth/Gender: 1976-05-19 (43 y.o. M) Treating RN: Huel Coventry Primary Care Physician: PATIENT, NO Other Clinician: Referring Physician: Naoma Diener Treating Physician/Extender: Linwood Dibbles, HOYT Weeks in  Treatment: 0 Education Assessment Education Provided To: Patient Education Topics Provided Wound/Skin Impairment: Handouts: Caring for Your Ulcer Methods: Demonstration, Explain/Verbal Responses: State content correctly Electronic Signature(s) Signed: 06/20/2018 5:06:25 PM By: Elliot Gurney, BSN, RN, CWS, Kim RN, BSN Entered By: Elliot Gurney, BSN, RN, CWS, Kim on 06/20/2018 16:18:38 Ryan Leonard ([id number]) -------------------------------------------------------------------------------- Wound Assessment Details Patient Name: Ryan Leonard Date of Service: 06/20/2018 2:30 PM Medical Record Number: [id number] Patient Account Number: [id number] Date of Birth/Sex: 05/12/1976 (42 y.o. M) Treating RN: Curtis Sites Primary Care Gilbert Narain: PATIENT, NO Other Clinician: Referring Katianne Barre: Naoma Diener Treating Kendrell Lottman/Extender: STONE III, HOYT Weeks in Treatment: 0 Wound Status Wound Number: 7 Primary Diabetic Wound/Ulcer of the Lower Extremity Etiology: Wound Location: Right, Medial Malleolus Wound Open Wounding Event: Blister Status: Date Acquired: 03/20/2018 Comorbid Asthma, Congestive Heart Failure, Weeks Of Treatment: 0 History: Hypertension, Myocardial Infarction, Type II Clustered Wound: No Diabetes, Neuropathy Photos Photo Uploaded By: Elliot Gurney, BSN, RN, CWS, Kim on 06/20/2018 17:06:11 Wound Measurements Length: (cm) 0.5 % Reducti Width: (cm) 0.8 % Reducti Depth: (cm) 0.1 Epithelia Area: (cm) 0.314 Tunnelin Volume: (cm) 0.031 Undermin on in Area: 99% on in Volume: 99% lization: None g: No ing: No Wound Description Classification: Grade 2 Foul Odor Wound Margin: Flat and Intact Slough/Fi Exudate Amount: Medium Exudate Type: Serous Exudate Color: amber After Cleansing: No brino Yes Wound Bed Granulation Amount: None Present (0%) Exposed Structure Necrotic Amount: Large (67-100%) Fascia Exposed: No Necrotic Quality: Eschar, Adherent Slough Fat Layer (Subcutaneous  Tissue) Exposed: Yes Tendon Exposed: No Muscle Exposed: No Joint Exposed: No Bone Exposed: No Periwound Skin Texture Thong, Marguerite ([id number]) Texture Color No Abnormalities Noted: No No Abnormalities Noted:  No Callus: No Atrophie Blanche: No Crepitus: No Cyanosis: No Excoriation: No Ecchymosis: No Induration: No Erythema: No Rash: No Hemosiderin Staining: No Scarring: No Mottled: No Pallor: No Moisture Rubor: No No Abnormalities Noted: No Dry / Scaly: No Maceration: No Wound Preparation Ulcer Cleansing: Rinsed/Irrigated with Saline Topical Anesthetic Applied: Other: lidocaine 4%, Electronic Signature(s) Signed: 06/20/2018 5:22:36 PM By: Curtis Sites Entered By: Curtis Sites on 06/20/2018 16:03:51 Ryan Leonard ([id number]) -------------------------------------------------------------------------------- Vitals Details Patient Name: Ryan Leonard Date of Service: 06/20/2018 2:30 PM Medical Record Number: [id number] Patient Account Number: [id number] Date of Birth/Sex: [birth year] (43 y.o. M) Treating RN: Huel Coventry Primary Care Haidyn Chadderdon: PATIENT, NO Other Clinician: Referring Merci Walthers: Naoma Diener Treating Lu Paradise/Extender: STONE III, HOYT Weeks in Treatment: 0 Vital Signs Time Taken: 15:16 Temperature (F): 98.2 Height (in): 72 Pulse (bpm): 78 Weight (lbs): 255.8 Respiratory Rate (breaths/min): 16 Source: Measured Blood Pressure (mmHg): 169/80 Body Mass Index (BMI): 34.7 Reference Range: 80 - 120 mg / dl Electronic Signature(s) Signed: 06/20/2018 5:06:25 PM By: Elliot Gurney, BSN, RN, CWS, Kim RN, BSN Entered By: Elliot Gurney, BSN, RN, CWS, Kim on 06/20/2018 15:16:48

## 2018-06-23 ENCOUNTER — Encounter (INDEPENDENT_AMBULATORY_CARE_PROVIDER_SITE_OTHER): Payer: Self-pay | Admitting: Vascular Surgery

## 2018-06-23 ENCOUNTER — Ambulatory Visit (INDEPENDENT_AMBULATORY_CARE_PROVIDER_SITE_OTHER): Payer: Managed Care, Other (non HMO) | Admitting: Vascular Surgery

## 2018-06-23 VITALS — BP 133/76 | HR 84 | Resp 14 | Ht 72.0 in | Wt 255.4 lb

## 2018-06-23 DIAGNOSIS — R600 Localized edema: Secondary | ICD-10-CM | POA: Diagnosis not present

## 2018-06-23 DIAGNOSIS — E1159 Type 2 diabetes mellitus with other circulatory complications: Secondary | ICD-10-CM

## 2018-06-23 DIAGNOSIS — M7989 Other specified soft tissue disorders: Secondary | ICD-10-CM

## 2018-06-23 DIAGNOSIS — E785 Hyperlipidemia, unspecified: Secondary | ICD-10-CM | POA: Diagnosis not present

## 2018-06-23 DIAGNOSIS — I10 Essential (primary) hypertension: Secondary | ICD-10-CM | POA: Diagnosis not present

## 2018-06-23 DIAGNOSIS — I7025 Atherosclerosis of native arteries of other extremities with ulceration: Secondary | ICD-10-CM

## 2018-06-23 NOTE — Assessment & Plan Note (Signed)
Worsening.  Making the wound more difficult to heal.  At this point, he needs to get an compression stockings that we will fit him in a new prescription was given today.  We have also given him the information of the facility in [place] [facility] [place] that sells the stockings it cost.  A venous reflux study will be done in the near future at his convenience for further evaluation.  We will discuss further treatment such as laser ablation or lymph pump after this visit.

## 2018-06-23 NOTE — Progress Notes (Signed)
MRN : [id number]  Ryan Leonard is a 43 y.o. ([birth year]) male who presents with chief complaint of  Chief Complaint  Patient presents with  . Follow-up  .  History of Present Illness: Patient returns today in follow up of lower extremity ulceration and worsening swelling.  The patient does have a persistent wound on the right ankle but this is slowly improving.  He was recently hospitalized with renal insufficiency and congestive heart failure and has had very significant leg swelling.  The right leg is a little more swollen than the left, but both are swollen.  This is gotten a little bit better with the addition of Lasix and elevation, but still remains prominent.  He had ABIs checked about 3 months ago and were relatively normal at that time.  He has another visit scheduled in the next couple of months.  No fevers or chills.  The legs are heavy but not overtly painful.  Current Outpatient Medications  Medication Sig Dispense Refill  . aspirin EC 81 MG tablet Take 1 tablet (81 mg total) by mouth daily. 150 tablet 2  . atorvastatin (LIPITOR) 40 MG tablet Take 1 tablet (40 mg total) by mouth daily at 6 PM. 30 tablet 1  . Cholecalciferol (VITAMIN D) 125 MCG (5000 UT) CAPS Take 5,000 Units by mouth daily.     . furosemide (LASIX) 20 MG tablet Take 20 mg by mouth daily.    . hydrALAZINE (APRESOLINE) 25 MG tablet Take 3 tablets (75 mg total) by mouth every 8 (eight) hours. 90 tablet 1  . labetalol (NORMODYNE) 100 MG tablet Take 1 tablet (100 mg total) by mouth 2 (two) times daily. 60 tablet 1   No current facility-administered medications for this visit.     Past Medical History:  Diagnosis Date  . Depression   . Diabetes mellitus without complication (HCC)   . Hyperlipidemia   . Hypertension   . Peripheral vascular disease [facility])     Past Surgical History:  Procedure Laterality Date  . EYE SURGERY    . LOWER EXTREMITY ANGIOGRAPHY Right 07/28/2017   Procedure: LOWER EXTREMITY  ANGIOGRAPHY;  Surgeon: Annice Needy, MD;  Location: [facility] INVASIVE CV LAB;  Service: Cardiovascular;  Laterality: Right;  . WISDOM TOOTH EXTRACTION      Social History        Tobacco Use  . Smoking status: Never Smoker  . Smokeless tobacco: Never Used  Substance Use Topics  . Alcohol use: No    Frequency: Never  . Drug use: No    Family History      Family History  Problem Relation Age of Onset  . Diabetes Maternal Grandmother   . Colon cancer Maternal Grandfather         Allergies  Allergen Reactions  . Codeine Nausea Only     REVIEW OF SYSTEMS (Negative unless checked)  Constitutional: [] ?Weight loss  [] ?Fever  [] ?Chills Cardiac: [] ?Chest pain   [] ?Chest pressure   [] ?Palpitations   [] ?Shortness of breath when laying flat   [] ?Shortness of breath at rest   [] ?Shortness of breath with exertion. Vascular:  [] ?Pain in legs with walking   [] ?Pain in legs at rest   [] ?Pain in legs when laying flat   [] ?Claudication   [] ?Pain in feet when walking  [] ?Pain in feet at rest  [] ?Pain in feet when laying flat   [] ?History of DVT   [] ?Phlebitis   [x] ?Swelling in legs   [] ?Varicose veins   [x] ?Non-healing ulcers  Pulmonary:   [] ?Uses home oxygen   [] ?Productive cough   [] ?Hemoptysis   [] ?Wheeze  [] ?COPD   [] ?Asthma Neurologic:  [] ?Dizziness  [] ?Blackouts   [] ?Seizures   [] ?History of stroke   [] ?History of TIA  [] ?Aphasia   [] ?Temporary blindness   [] ?Dysphagia   [] ?Weakness or numbness in arms   [] ?Weakness or numbness in legs Musculoskeletal:  [] ?Arthritis   [] ?Joint swelling   [] ?Joint pain   [] ?Low back pain Hematologic:  [] ?Easy bruising  [] ?Easy bleeding   [] ?Hypercoagulable state   [] ?Anemic   Gastrointestinal:  [] ?Blood in stool   [] ?Vomiting blood  [x] ?Gastroesophageal reflux/heartburn   [] ?Abdominal pain Genitourinary:  [] ?Chronic kidney disease   [] ?Difficult urination  [] ?Frequent urination  [] ?Burning with urination   [] ?Hematuria Skin:  [] ?Rashes   [x] ?Ulcers    [x] ?Wounds Psychological:  [] ?History of anxiety   [] ? History of major depression.    Physical Examination  BP 133/76 (BP Location: Right Arm, Patient Position: Sitting)   Pulse 84   Resp 14   Ht 6' (1.829 m)   Wt 255 lb 6.4 oz (115.8 kg)   BMI 34.64 kg/m  Gen:  WD/WN, NAD Head: [place]/AT, No temporalis wasting. Ear/Nose/Throat: Hearing grossly intact, nares w/o erythema or drainage Eyes: Conjunctiva clear. Sclera non-icteric Neck: Supple.  Trachea midline Pulmonary:  Good air movement, no use of accessory muscles.  Cardiac: RRR, no JVD Vascular:  Vessel Right Left  Radial Palpable Palpable                          PT Not Palpable Not Palpable  DP 1+ Palpable 1+ Palpable    Musculoskeletal: M/S 5/5 throughout.  No deformity or atrophy.  Small clean ulceration on the medial aspect of the right ankle.  3+ right lower extremity edema, 2+ left lower extremity edema. Neurologic: Sensation grossly intact in extremities.  Symmetrical.  Speech is fluent.  Psychiatric: Judgment intact, Mood & affect appropriate for pt's clinical situation. Dermatologic: No rashes or ulcers noted.  No cellulitis or open wounds.       Labs Recent Results (from the past 2160 hour(s))  CBC     Status: Abnormal   Collection Time: 06/05/18  1:37 PM  Result Value Ref Range   WBC 9.4 4.0 - 10.5 K/uL   RBC 4.09 (L) 4.22 - 5.81 MIL/uL   Hemoglobin 10.9 (L) 13.0 - 17.0 g/dL   HCT 71.6 (L) 96.7 - 89.3 %   MCV 83.6 80.0 - 100.0 fL   MCH 26.7 26.0 - 34.0 pg   MCHC 31.9 30.0 - 36.0 g/dL   RDW 81.0 17.5 - 10.2 %   Platelets 322 150 - 400 K/uL   nRBC 0.0 0.0 - 0.2 %    Comment: Performed at [facility], [address] [place], [place] [address]  Comprehensive metabolic panel     Status: Abnormal   Collection Time: 06/05/18  1:37 PM  Result Value Ref Range   Sodium 138 135 - 145 mmol/L   Potassium 4.0 3.5 - 5.1 mmol/L   Chloride 106 98 - 111 mmol/L   CO2 26 22 - 32 mmol/L   Glucose,  Bld 215 (H) 70 - 99 mg/dL   BUN 18 6 - 20 mg/dL   Creatinine, Ser 7.82 (H) 0.61 - 1.24 mg/dL   Calcium 8.8 (L) 8.9 - 10.3 mg/dL   Total Protein 6.9 6.5 - 8.1 g/dL   Albumin 3.0 (L) 3.5 - 5.0 g/dL  AST 19 15 - 41 U/L   ALT 18 0 - 44 U/L   Alkaline Phosphatase 91 38 - 126 U/L   Total Bilirubin 0.7 0.3 - 1.2 mg/dL   GFR calc non Af Amer 51 (L) >60 mL/min   GFR calc Af Amer 59 (L) >60 mL/min   Anion gap 6 5 - 15    Comment: Performed at [facility], [address] [place], [place] [address]  Troponin I - ONCE - STAT     Status: None   Collection Time: 06/05/18  1:37 PM  Result Value Ref Range   Troponin I <0.03 <0.03 ng/mL    Comment: Performed at [facility], [address] [place], [place] [address]  Brain natriuretic peptide     Status: Abnormal   Collection Time: 06/05/18  1:37 PM  Result Value Ref Range   B Natriuretic Peptide 234.0 (H) 0.0 - 100.0 pg/mL    Comment: Performed at [facility], [address] [place], [place] [address]  Hemoglobin A1c     Status: Abnormal   Collection Time: 06/05/18  3:48 PM  Result Value Ref Range   Hgb A1c MFr Bld 8.1 (H) 4.8 - 5.6 %    Comment: (NOTE) Pre diabetes:          5.7%-6.4% Diabetes:              >6.4% Glycemic control for   <7.0% adults with diabetes    Mean Plasma Glucose 185.77 mg/dL    Comment: Performed at [facility], [address] [place], [place] [address]  Glucose, capillary     Status: Abnormal   Collection Time: 06/05/18  5:26 PM  Result Value Ref Range   Glucose-Capillary 183 (H) 70 - 99 mg/dL  HIV antibody (Routine Testing)     Status: None   Collection Time: 06/05/18  5:27 PM  Result Value Ref Range   HIV Screen 4th Generation wRfx Non Reactive Non Reactive    Comment: (NOTE) Performed At: [facility] [address] [place], [place] [id number] Jolene Schimke MD MV:[id number]   CBC     Status: Abnormal   Collection Time: 06/05/18  5:27 PM  Result Value  Ref Range   WBC 10.5 4.0 - 10.5 K/uL   RBC 3.95 (L) 4.22 - 5.81 MIL/uL   Hemoglobin 10.3 (L) 13.0 - 17.0 g/dL   HCT 84.1 (L) 32.4 - 40.1 %   MCV 81.8 80.0 - 100.0 fL   MCH 26.1 26.0 - 34.0 pg   MCHC 31.9 30.0 - 36.0 g/dL   RDW 02.7 25.3 - 66.4 %   Platelets 326 150 - 400 K/uL   nRBC 0.0 0.0 - 0.2 %    Comment: Performed at [facility], [address] [place], [place] [address]  Creatinine, serum     Status: Abnormal   Collection Time: 06/05/18  5:27 PM  Result Value Ref Range   Creatinine, Ser 1.66 (H) 0.61 - 1.24 mg/dL   GFR calc non Af Amer 49 (L) >60 mL/min   GFR calc Af Amer 57 (L) >60 mL/min    Comment: Performed at [facility], [address] [place], [place] [address]  TSH     Status: None   Collection Time: 06/05/18  5:27 PM  Result Value Ref Range   TSH 4.026 0.350 - 4.500 uIU/mL    Comment: Performed by a 3rd Generation assay with a functional sensitivity of <=0.01 uIU/mL. Performed  at [facility], [address] [place], [place] [address]   Troponin I - Now Then Q6H     Status: Abnormal   Collection Time: 06/05/18  5:27 PM  Result Value Ref Range   Troponin I 0.03 (HH) <0.03 ng/mL    Comment: CRITICAL RESULT CALLED TO, READ BACK BY AND VERIFIED WITH  ERICA MORRALIS AT 1813 ON 06/05/18 BY SNJ Performed at [facility], [address] [place], [place] [address]   Glucose, capillary     Status: Abnormal   Collection Time: 06/05/18  9:17 PM  Result Value Ref Range   Glucose-Capillary 231 (H) 70 - 99 mg/dL  Troponin I - Now Then Q6H     Status: Abnormal   Collection Time: 06/05/18 11:06 PM  Result Value Ref Range   Troponin I 0.04 (HH) <0.03 ng/mL    Comment: CRITICAL VALUE NOTED. VALUE IS CONSISTENT WITH PREVIOUSLY REPORTED/CALLED VALUE/ MLK Performed at [facility], [address] [place], [place] [address]   Urinalysis, Routine w reflex microscopic     Status: Abnormal   Collection Time: 06/05/18 11:43  PM  Result Value Ref Range   Color, Urine YELLOW (A) YELLOW   APPearance CLOUDY (A) CLEAR   Specific Gravity, Urine 1.010 1.005 - 1.030   pH 6.0 5.0 - 8.0   Glucose, UA 50 (A) NEGATIVE mg/dL   Hgb urine dipstick NEGATIVE NEGATIVE   Bilirubin Urine NEGATIVE NEGATIVE   Ketones, ur NEGATIVE NEGATIVE mg/dL   Protein, ur 956100 (A) NEGATIVE mg/dL   Nitrite NEGATIVE NEGATIVE   Leukocytes, UA NEGATIVE NEGATIVE   RBC / HPF 6-10 0 - 5 RBC/hpf   WBC, UA 6-10 0 - 5 WBC/hpf   Bacteria, UA NONE SEEN NONE SEEN   Squamous Epithelial / LPF NONE SEEN 0 - 5   Mucus PRESENT    Hyaline Casts, UA PRESENT    Sperm, UA PRESENT     Comment: Performed at [facility], [address] [place], [place] [address]  Troponin I - Now Then Q6H     Status: Abnormal   Collection Time: 06/06/18  5:19 AM  Result Value Ref Range   Troponin I 0.19 (HH) <0.03 ng/mL    Comment: CRITICAL VALUE NOTED. VALUE IS CONSISTENT WITH PREVIOUSLY REPORTED/CALLED VALUE KBH Performed at [facility], [address] [place], [place] [address]   CBC     Status: Abnormal   Collection Time: 06/06/18  5:19 AM  Result Value Ref Range   WBC 9.2 4.0 - 10.5 K/uL   RBC 3.28 (L) 4.22 - 5.81 MIL/uL   Hemoglobin 8.5 (L) 13.0 - 17.0 g/dL   HCT 69.627.2 (L) 29.539.0 - 28.452.0 %   MCV 82.9 80.0 - 100.0 fL   MCH 25.9 (L) 26.0 - 34.0 pg   MCHC 31.3 30.0 - 36.0 g/dL   RDW 13.214.5 44.011.5 - 10.215.5 %   Platelets 287 150 - 400 K/uL   nRBC 0.0 0.0 - 0.2 %    Comment: Performed at [facility], [address] [place], [place] [address]  Basic metabolic panel     Status: Abnormal   Collection Time: 06/06/18  5:19 AM  Result Value Ref Range   Sodium 138 135 - 145 mmol/L   Potassium 3.4 (L) 3.5 - 5.1 mmol/L   Chloride 105 98 - 111 mmol/L   CO2 24 22 - 32 mmol/L   Glucose, Bld 190 (H) 70 - 99 mg/dL   BUN 25 (  H) 6 - 20 mg/dL   Creatinine, Ser 1.61 (H) 0.61 - 1.24 mg/dL   Calcium 8.0 (L) 8.9 - 10.3 mg/dL   GFR calc non Af Amer 29 (L)  >60 mL/min   GFR calc Af Amer 34 (L) >60 mL/min   Anion gap 9 5 - 15    Comment: Performed at [facility], [address] [place], [place] [address]  Glucose, capillary     Status: Abnormal   Collection Time: 06/06/18  7:36 AM  Result Value Ref Range   Glucose-Capillary 137 (H) 70 - 99 mg/dL  Glucose, capillary     Status: Abnormal   Collection Time: 06/06/18 12:17 PM  Result Value Ref Range   Glucose-Capillary 256 (H) 70 - 99 mg/dL  ECHOCARDIOGRAM COMPLETE     Status: None   Collection Time: 06/06/18  1:59 PM  Result Value Ref Range   Weight 3,977.6 oz   Height 72 in   BP 128/63 mmHg  Glucose, capillary     Status: Abnormal   Collection Time: 06/06/18  3:40 PM  Result Value Ref Range   Glucose-Capillary 207 (H) 70 - 99 mg/dL  Troponin I - Once     Status: Abnormal   Collection Time: 06/06/18  4:32 PM  Result Value Ref Range   Troponin I 0.25 (HH) <0.03 ng/mL    Comment: CRITICAL VALUE NOTED. VALUE IS CONSISTENT WITH PREVIOUSLY REPORTED/CALLED VALUE/JUW Performed at [facility], [address] [place], [place] [address]   Glucose, capillary     Status: Abnormal   Collection Time: 06/06/18  8:57 PM  Result Value Ref Range   Glucose-Capillary 110 (H) 70 - 99 mg/dL  Protein / creatinine ratio, urine     Status: Abnormal   Collection Time: 06/07/18 12:46 AM  Result Value Ref Range   Creatinine, Urine 368 mg/dL   Total Protein, Urine 359 mg/dL    Comment: RESULT CONFIRMED BY MANUAL DILUTION SRC NO NORMAL RANGE ESTABLISHED FOR THIS TEST    Protein Creatinine Ratio 0.98 (H) 0.00 - 0.15 mg/mg[Cre]    Comment: Performed at [facility], [address] [place], [place] [address]  Basic metabolic panel     Status: Abnormal   Collection Time: 06/07/18  4:17 AM  Result Value Ref Range   Sodium 138 135 - 145 mmol/L   Potassium 3.5 3.5 - 5.1 mmol/L   Chloride 107 98 - 111 mmol/L   CO2 25 22 - 32 mmol/L   Glucose, Bld 133 (H) 70 - 99 mg/dL   BUN  26 (H) 6 - 20 mg/dL   Creatinine, Ser 7.82 (H) 0.61 - 1.24 mg/dL   Calcium 7.9 (L) 8.9 - 10.3 mg/dL   GFR calc non Af Amer 27 (L) >60 mL/min   GFR calc Af Amer 32 (L) >60 mL/min   Anion gap 6 5 - 15    Comment: Performed at [facility], [address] [place], [place] [address]  CBC     Status: Abnormal   Collection Time: 06/07/18  4:17 AM  Result Value Ref Range   WBC 8.2 4.0 - 10.5 K/uL   RBC 3.30 (L) 4.22 - 5.81 MIL/uL   Hemoglobin 8.7 (L) 13.0 - 17.0 g/dL   HCT 30.8 (L) 65.7 - 84.6 %   MCV 81.8 80.0 - 100.0 fL   MCH 26.4 26.0 - 34.0 pg   MCHC 32.2 30.0 - 36.0 g/dL   RDW 96.2 95.2 - 84.1 %  Platelets 267 150 - 400 K/uL   nRBC 0.0 0.0 - 0.2 %    Comment: Performed at [facility], [address] [place], [place] [address]  Magnesium     Status: None   Collection Time: 06/07/18  4:17 AM  Result Value Ref Range   Magnesium 2.1 1.7 - 2.4 mg/dL    Comment: Performed at [facility], [address] [place], [place] [address]  Glucose, capillary     Status: Abnormal   Collection Time: 06/07/18  8:05 AM  Result Value Ref Range   Glucose-Capillary 112 (H) 70 - 99 mg/dL  CULTURE, BLOOD (ROUTINE X 2) w Reflex to ID Panel     Status: None   Collection Time: 06/07/18  8:26 AM  Result Value Ref Range   Specimen Description BLOOD LEFT AC    Special Requests      BOTTLES DRAWN AEROBIC AND ANAEROBIC Blood Culture adequate volume   Culture      NO GROWTH 5 DAYS Performed at [facility], [address] [place], [place] [address]    Report Status 06/12/2018 FINAL   Troponin I - Once     Status: Abnormal   Collection Time: 06/07/18  8:26 AM  Result Value Ref Range   Troponin I 0.54 (HH) <0.03 ng/mL    Comment: CRITICAL VALUE NOTED. VALUE IS CONSISTENT WITH PREVIOUSLY REPORTED/CALLED VALUE KBH Performed at [facility], [address] [place], [place] [address]   CULTURE, BLOOD (ROUTINE X 2) w Reflex to ID Panel     Status: None     Collection Time: 06/07/18  8:30 AM  Result Value Ref Range   Specimen Description BLOOD LEFT HAND    Special Requests      BOTTLES DRAWN AEROBIC AND ANAEROBIC Blood Culture adequate volume   Culture      NO GROWTH 5 DAYS Performed at [facility], [address] [place], [place] [address]    Report Status 06/12/2018 FINAL   Glucose, capillary     Status: Abnormal   Collection Time: 06/07/18 11:39 AM  Result Value Ref Range   Glucose-Capillary 213 (H) 70 - 99 mg/dL  Glucose, capillary     Status: Abnormal   Collection Time: 06/07/18  4:35 PM  Result Value Ref Range   Glucose-Capillary 192 (H) 70 - 99 mg/dL  Ammonia     Status: None   Collection Time: 06/07/18  7:42 PM  Result Value Ref Range   Ammonia 21 9 - 35 umol/L    Comment: Performed at [facility], [address] [place], [place] [address]  Glucose, capillary     Status: Abnormal   Collection Time: 06/07/18  9:06 PM  Result Value Ref Range   Glucose-Capillary 146 (H) 70 - 99 mg/dL   Comment 1 Notify RN   Basic metabolic panel     Status: Abnormal   Collection Time: 06/08/18  4:29 AM  Result Value Ref Range   Sodium 137 135 - 145 mmol/L   Potassium 3.6 3.5 - 5.1 mmol/L   Chloride 107 98 - 111 mmol/L   CO2 23 22 - 32 mmol/L   Glucose, Bld 145 (H) 70 - 99 mg/dL   BUN 26 (H) 6 - 20 mg/dL   Creatinine, Ser 5.78 (H) 0.61 - 1.24 mg/dL   Calcium 7.8 (L) 8.9 - 10.3 mg/dL   GFR calc non Af Amer 31 (L) >60 mL/min   GFR calc Af Amer 36 (L) >  60 mL/min   Anion gap 7 5 - 15    Comment: Performed at [facility], [address] [place], [place] [address]  Glucose, capillary     Status: Abnormal   Collection Time: 06/08/18  5:46 AM  Result Value Ref Range   Glucose-Capillary 147 (H) 70 - 99 mg/dL   Comment 1 Notify RN   Magnesium     Status: None   Collection Time: 06/08/18  6:32 AM  Result Value Ref Range   Magnesium 2.0 1.7 - 2.4 mg/dL    Comment: Performed at [facility], [address] [place], [place] [address]  Phosphorus     Status: None   Collection Time: 06/08/18  6:32 AM  Result Value Ref Range   Phosphorus 4.0 2.5 - 4.6 mg/dL    Comment: Performed at [facility], [address] [place], [place] [address]  Hepatic function panel     Status: Abnormal   Collection Time: 06/08/18  6:32 AM  Result Value Ref Range   Total Protein 5.8 (L) 6.5 - 8.1 g/dL   Albumin 2.6 (L) 3.5 - 5.0 g/dL   AST 15 15 - 41 U/L   ALT 14 0 - 44 U/L   Alkaline Phosphatase 69 38 - 126 U/L   Total Bilirubin 0.7 0.3 - 1.2 mg/dL   Bilirubin, Direct <2.1<0.1 0.0 - 0.2 mg/dL   Indirect Bilirubin NOT CALCULATED 0.3 - 0.9 mg/dL    Comment: Performed at [facility], [address] [place], [place] [address]  Calcium, ionized     Status: None   Collection Time: 06/08/18  6:32 AM  Result Value Ref Range   Calcium, Ionized, Serum 4.6 4.5 - 5.6 mg/dL    Comment: (NOTE) Performed At: [facility] [address] [place], [place] [id number] Jolene SchimkeNagendra Sanjai MD MW:[phone]   Troponin I - Once     Status: Abnormal   Collection Time: 06/08/18  6:32 AM  Result Value Ref Range   Troponin I 0.44 (HH) <0.03 ng/mL    Comment: CRITICAL VALUE NOTED. VALUE IS CONSISTENT WITH PREVIOUSLY REPORTED/CALLED VALUE  SDR Performed at [facility], [address] [place], [place] [address]   Folate     Status: None   Collection Time: 06/08/18  6:32 AM  Result Value Ref Range   Folate 10.5 >5.9 ng/mL    Comment: Performed at [facility], [address] [place], [place] [address]  RPR     Status: None   Collection Time: 06/08/18  6:32 AM  Result Value Ref Range   RPR Ser Ql Non Reactive Non Reactive    Comment: (NOTE) Performed At: [facility] [place] [address] [place], [place] [id number] Jolene SchimkeNagendra Sanjai MD OV:[phone]   Vitamin B12     Status: Abnormal   Collection Time: 06/08/18  6:33 AM  Result Value Ref Range   Vitamin B-12 177 (L) 180  - 914 pg/mL    Comment: (NOTE) This assay is not validated for testing neonatal or myeloproliferative syndrome specimens for Vitamin B12 levels. Performed at [facility], [address] [place], [place] [address]   Ammonia     Status: None   Collection Time: 06/08/18  6:33 AM  Result Value Ref Range   Ammonia 28 9 - 35 umol/L    Comment: Performed at [facility], [address] [place], [place] [address]  Glucose, capillary     Status: Abnormal   Collection Time:  06/08/18  8:04 AM  Result Value Ref Range   Glucose-Capillary 145 (H) 70 - 99 mg/dL   Comment 1 Notify RN   Urine Drug Screen, Qualitative ([facility] only)     Status: None   Collection Time: 06/08/18 10:51 AM  Result Value Ref Range   Tricyclic, Ur Screen NONE DETECTED NONE DETECTED   Amphetamines, Ur Screen NONE DETECTED NONE DETECTED   MDMA (Ecstasy)Ur Screen NONE DETECTED NONE DETECTED   Cocaine Metabolite,Ur [place] NONE DETECTED NONE DETECTED   Opiate, Ur Screen NONE DETECTED NONE DETECTED   Phencyclidine (PCP) Ur S NONE DETECTED NONE DETECTED   Cannabinoid 50 Ng, Ur  NONE DETECTED NONE DETECTED   Barbiturates, Ur Screen NONE DETECTED NONE DETECTED   Benzodiazepine, Ur Scrn NONE DETECTED NONE DETECTED   Methadone Scn, Ur NONE DETECTED NONE DETECTED    Comment: (NOTE) Tricyclics + metabolites, urine    Cutoff 1000 ng/mL Amphetamines + metabolites, urine  Cutoff 1000 ng/mL MDMA (Ecstasy), urine              Cutoff 500 ng/mL Cocaine Metabolite, urine          Cutoff 300 ng/mL Opiate + metabolites, urine        Cutoff 300 ng/mL Phencyclidine (PCP), urine         Cutoff 25 ng/mL Cannabinoid, urine                 Cutoff 50 ng/mL Barbiturates + metabolites, urine  Cutoff 200 ng/mL Benzodiazepine, urine              Cutoff 200 ng/mL Methadone, urine                   Cutoff 300 ng/mL The urine drug screen provides only a preliminary, unconfirmed analytical test result and should not be used for  non-medical purposes. Clinical consideration and professional judgment should be applied to any positive drug screen result due to possible interfering substances. A more specific alternate chemical method must be used in order to obtain a confirmed analytical result. Gas chromatography / mass spectrometry (GC/MS) is the preferred confirmat ory method. Performed at [facility], [address] [place], [place] [address]   Glucose, capillary     Status: Abnormal   Collection Time: 06/08/18 12:00 PM  Result Value Ref Range   Glucose-Capillary 179 (H) 70 - 99 mg/dL   Comment 1 Notify RN   Glucose, capillary     Status: Abnormal   Collection Time: 06/08/18  4:48 PM  Result Value Ref Range   Glucose-Capillary 160 (H) 70 - 99 mg/dL  Glucose, capillary     Status: Abnormal   Collection Time: 06/08/18  9:00 PM  Result Value Ref Range   Glucose-Capillary 137 (H) 70 - 99 mg/dL   Comment 1 Notify RN    Comment 2 Document in Chart   Basic metabolic panel     Status: Abnormal   Collection Time: 06/09/18  4:26 AM  Result Value Ref Range   Sodium 140 135 - 145 mmol/L   Potassium 3.8 3.5 - 5.1 mmol/L   Chloride 111 98 - 111 mmol/L   CO2 24 22 - 32 mmol/L   Glucose, Bld 123 (H) 70 - 99 mg/dL   BUN 21 (H) 6 - 20 mg/dL   Creatinine, Ser 6.04 (H) 0.61 - 1.24 mg/dL   Calcium 7.6 (L) 8.9 - 10.3 mg/dL   GFR calc non Af Amer 38 (L) >60 mL/min  GFR calc Af Amer 45 (L) >60 mL/min   Anion gap 5 5 - 15    Comment: Performed at [facility], [address] [place], [place] [address]  Glucose, capillary     Status: Abnormal   Collection Time: 06/09/18  8:05 AM  Result Value Ref Range   Glucose-Capillary 125 (H) 70 - 99 mg/dL   Comment 1 Notify RN    Comment 2 Document in Chart     Radiology Ct Head Wo Contrast  Result Date: 06/08/2018 CLINICAL DATA:  Altered mental status.  Hypertension. EXAM: CT HEAD WITHOUT CONTRAST TECHNIQUE: Contiguous axial images were obtained from  the base of the skull through the vertex without intravenous contrast. COMPARISON:  None. FINDINGS: Brain: The ventricles are normal in size and configuration. There is no intracranial mass, hemorrhage, extra-axial fluid collection, or midline shift. The brain parenchyma appears unremarkable. No acute infarct evident. Benign-appearing calcification is noted in the pineal region. Vascular: No hyperdense vessel. There is mild calcification in the distal vertebral artery on the left. Skull: Bony calvarium appears intact. Sinuses/Orbits: There is mild mucosal thickening in an ethmoid air cell on the right posteriorly. Other visualized paranasal sinuses are clear. Visualized orbits appear symmetric bilaterally. Other: Mastoid air cells are clear. IMPRESSION: No mass or hemorrhage. No acute infarct. Slight arterial vascular calcification. Mucosal thickening noted in a single posterior ethmoid air cell on the right. Electronically Signed   By: Bretta Bang III M.D.   On: 06/08/2018 08:04   US Renal  Result Date: 06/05/2018 CLINICAL DATA:  Acute renal failure EXAM: RENAL / URINARY TRACT ULTRASOUND COMPLETE COMPARISON:  None. FINDINGS: Right Kidney: Renal measurements: 10.9 x 5.7 x 6.1 cm = volume: 199 mL. The renal cortical echotexture exceeds that of the adjacent liver. There is no hydronephrosis nor cystic or solid mass demonstrated. Left Kidney: Renal measurements: 11.8 x 6.4 x 6.2 cm = volume: 244 mL. The cortical echotexture is increased similar to that on the right. There is no cystic or solid mass nor hydronephrosis. Bladder: Appears normal for degree of bladder distention. Bilateral ureteral jets are observed. IMPRESSION: Increased renal cortical echotexture bilaterally consistent with medical renal disease. No suspicious masses. No hydronephrosis. Bilateral ureteral jets are observed in the bladder. Electronically Signed   By: David  [place] M.D.   On: 06/05/2018 15:56   Dg Chest Portable 1  View  Result Date: 06/05/2018 CLINICAL DATA:  Cough, wheezing, and chest congestion which began at 2 a.m. this morning. Recent fever with vomiting and diarrhea lasting 1 day last week. History of diabetes, hypertension. EXAM: PORTABLE CHEST 1 VIEW COMPARISON:  None. FINDINGS: The lungs are adequately inflated. There is no focal infiltrate. There is no pleural effusion. The right hemidiaphragm is mildly elevated as compared to the left. The heart and pulmonary vascularity are normal. The mediastinum is normal in width. The bony thorax exhibits no acute abnormality. IMPRESSION: There is no pneumonia nor other acute cardiopulmonary abnormality. Mild elevation of the right hemidiaphragm may be normal for the patient, but right hemidiaphragm dysfunction is not excluded. I have no previous studies with which to compare. Electronically Signed   By: David  [place] M.D.   On: 06/05/2018 13:50    Assessment/Plan Hyperlipidemia lipid control important in reducing the progression of atherosclerotic disease. Continue statin therapy   Essential hypertension blood pressure control important in reducing the progression of atherosclerotic disease. On appropriate oral medications.   Type 2 diabetes mellitus with circulatory disorder (HCC) blood glucose control  important in reducing the progression of atherosclerotic disease. Also, involved in wound healing. On appropriate medications.  No problem-specific Assessment & Plan notes found for this encounter.    Festus Barren, MD  06/23/2018 12:00 PM    This note was created with Dragon medical transcription system.  Any errors from dictation are purely unintentional

## 2018-06-23 NOTE — Assessment & Plan Note (Addendum)
Perfusion was relatively recently checked and was pretty good.  This is scheduled to be checked again in the next couple of months.  Continue current medical regimen

## 2018-06-27 ENCOUNTER — Ambulatory Visit: Payer: Managed Care, Other (non HMO) | Admitting: Physician Assistant

## 2018-09-01 ENCOUNTER — Other Ambulatory Visit: Payer: Self-pay

## 2018-09-01 ENCOUNTER — Ambulatory Visit (INDEPENDENT_AMBULATORY_CARE_PROVIDER_SITE_OTHER): Payer: Managed Care, Other (non HMO)

## 2018-09-01 ENCOUNTER — Ambulatory Visit (INDEPENDENT_AMBULATORY_CARE_PROVIDER_SITE_OTHER): Payer: Managed Care, Other (non HMO) | Admitting: Vascular Surgery

## 2018-09-01 ENCOUNTER — Encounter (INDEPENDENT_AMBULATORY_CARE_PROVIDER_SITE_OTHER): Payer: Self-pay | Admitting: Vascular Surgery

## 2018-09-01 VITALS — BP 134/68 | HR 77 | Resp 17 | Ht 71.0 in | Wt 241.0 lb

## 2018-09-01 DIAGNOSIS — M7989 Other specified soft tissue disorders: Secondary | ICD-10-CM

## 2018-09-01 DIAGNOSIS — I10 Essential (primary) hypertension: Secondary | ICD-10-CM | POA: Diagnosis not present

## 2018-09-01 DIAGNOSIS — E1159 Type 2 diabetes mellitus with other circulatory complications: Secondary | ICD-10-CM | POA: Diagnosis not present

## 2018-09-01 DIAGNOSIS — E785 Hyperlipidemia, unspecified: Secondary | ICD-10-CM | POA: Diagnosis not present

## 2018-09-01 DIAGNOSIS — I739 Peripheral vascular disease, unspecified: Secondary | ICD-10-CM | POA: Diagnosis not present

## 2018-09-01 DIAGNOSIS — I7025 Atherosclerosis of native arteries of other extremities with ulceration: Secondary | ICD-10-CM | POA: Diagnosis not present

## 2018-09-01 DIAGNOSIS — R600 Localized edema: Secondary | ICD-10-CM | POA: Diagnosis not present

## 2018-09-01 DIAGNOSIS — Z79899 Other long term (current) drug therapy: Secondary | ICD-10-CM

## 2018-09-01 DIAGNOSIS — Z7984 Long term (current) use of oral hypoglycemic drugs: Secondary | ICD-10-CM

## 2018-09-01 NOTE — Patient Instructions (Signed)

## 2018-09-01 NOTE — Assessment & Plan Note (Signed)
No DVT, superficial thrombophlebitis, or venous reflux was identified but prominent lymph nodes were present bilaterally likely signifying lymphedema as the cause of the swelling.  Recommend elevation, compression, and increasing activity as tolerated.  Currently, his swelling symptoms are fairly mild.

## 2018-09-01 NOTE — Assessment & Plan Note (Signed)
His noninvasive studies today demonstrate a right ABI of 1.01 and a left ABI of 0.82.  His digital pressures are 122 on the right and 90 on the left. Perfusion is doing well.  No current limb threatening symptoms.  Plan to recheck in 1 year.

## 2018-09-01 NOTE — Progress Notes (Signed)
MRN : [id number]  Ryan Leonard is a 43 y.o. (1976/03/04) male who presents with chief complaint of  Chief Complaint  Patient presents with  . Follow-up    ultrasound  .  History of Present Illness: Patient returns today in follow up of his lower extremity ulceration and history of PAD.  He is about a year status post lower extremity intervention.  His ulcer has healed.  He is not having any current pain.  His lower extremity swelling at this time is fairly mild.  He reports no lifestyle limiting claudication or ischemic rest pain. His noninvasive studies today demonstrate a right ABI of 1.01 and a left ABI of 0.82.  His digital pressures are 122 on the right and 90 on the left. We also studied his venous system due to issues with swelling.  No DVT, superficial thrombophlebitis, or venous reflux was identified but prominent lymph nodes were present bilaterally likely signifying lymphedema as the cause of the swelling.  Current Outpatient Medications  Medication Sig Dispense Refill  . aspirin EC 81 MG tablet Take 1 tablet (81 mg total) by mouth daily. 150 tablet 2  . atorvastatin (LIPITOR) 40 MG tablet Take 1 tablet (40 mg total) by mouth daily at 6 PM. 30 tablet 1  . Cholecalciferol (VITAMIN D) 125 MCG (5000 UT) CAPS Take 5,000 Units by mouth daily.     . furosemide (LASIX) 20 MG tablet Take 20 mg by mouth daily.    Marland Kitchen glipiZIDE (GLUCOTROL XL) 5 MG 24 hr tablet Take by mouth.    . hydrALAZINE (APRESOLINE) 25 MG tablet Take 3 tablets (75 mg total) by mouth every 8 (eight) hours. 90 tablet 1  . labetalol (NORMODYNE) 100 MG tablet Take 1 tablet (100 mg total) by mouth 2 (two) times daily. 60 tablet 1  . metFORMIN (GLUCOPHAGE) 500 MG tablet Take by mouth.     No current facility-administered medications for this visit.     Past Medical History:  Diagnosis Date  . Depression   . Diabetes mellitus without complication (HCC)   . Hyperlipidemia   . Hypertension   . 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 Peripheral vascular disease (HCC)   . Retinopathy due to secondary diabetes [facility])     Past Surgical History:  Procedure Laterality Date  . EYE SURGERY    . LOWER EXTREMITY ANGIOGRAPHY Right 07/28/2017   Procedure: LOWER EXTREMITY ANGIOGRAPHY;  Surgeon: Annice Needy, MD;  Location: [facility] INVASIVE CV LAB;  Service: Cardiovascular;  Laterality: Right;  . WISDOM TOOTH EXTRACTION     Social History        Tobacco Use  . Smoking status: Never Smoker  . Smokeless tobacco: Never Used  Substance Use Topics  . Alcohol use: No    Frequency: Never  . Drug use: No    Family History      Family History  Problem Relation Age of Onset  . Diabetes Maternal Grandmother   . Colon cancer Maternal Grandfather         Allergies  Allergen Reactions  . Codeine Nausea Only     REVIEW OF SYSTEMS(Negative unless checked)  Constitutional: [] ??Weight loss [] ??Fever [] ??Chills Cardiac: [] ??Chest pain [] ??Chest pressure [] ??Palpitations [] ??Shortness of breath when laying flat [] ??Shortness of breath at rest [] ??Shortness of breath with exertion. Vascular: [] ??Pain in legs with walking [] ??Pain in legs at rest [] ??Pain in legs when laying flat [] ??Claudication [] ??Pain in feet when walking [] ??Pain in  feet at rest ??Pain in feet when laying flat ??History of DVT ??Phlebitis ??Swelling in legs ??Varicose veins ??Non-healing ulcers Pulmonary: ??Uses home oxygen ??Productive cough ??Hemoptysis ??Wheeze ??COPD ??Asthma Neurologic: ??Dizziness ??Blackouts ??Seizures ??History of stroke ??History of TIA ??Aphasia ??Temporary blindness ??Dysphagia ??Weakness or numbness in arms ??Weakness or numbness in legs Musculoskeletal: ??Arthritis ??Joint swelling ??Joint pain ??Low back pain Hematologic: ??Easy bruising  ??Easy bleeding ??Hypercoagulable state ??Anemic  Gastrointestinal: ??Blood in stool ??Vomiting blood ??Gastroesophageal reflux/heartburn ??Abdominal pain Genitourinary: ??Chronic kidney disease ??Difficult urination ??Frequent urination ??Burning with urination ??Hematuria Skin: ??Rashes ??Ulcers ??Wounds Psychological: ??History of anxiety ??History of major depression.    Physical Examination  BP 134/68   Pulse 77   Resp 17   Ht  (1.803 m)   Wt 241 lb (109.3 kg)   BMI 33.61 kg/m  Gen:  WD/WN, NAD Head: [place]/AT, No temporalis wasting. Ear/Nose/Throat: Hearing grossly intact, nares w/o erythema or drainage Eyes: Conjunctiva clear. Sclera non-icteric Neck: Supple.  Trachea midline Pulmonary:  Good air movement, no use of accessory muscles.  Cardiac: RRR, no JVD Vascular:  Vessel Right Left  Radial Palpable Palpable                          PT 1+ Palpable 1+ Palpable  DP Palpable Palpable    Musculoskeletal: M/S 5/5 throughout.  No deformity or atrophy. Mild BLE edema. Neurologic: Sensation grossly intact in extremities.  Symmetrical.  Speech is fluent.  Psychiatric: Judgment intact, Mood & affect appropriate for pt's clinical situation. Dermatologic: No rashes or ulcers noted.  No cellulitis or open wounds.       Labs Recent Results (from the past 2160 hour(s))  CBC     Status: Abnormal   Collection Time: 06/05/18  1:37 PM  Result Value Ref Range   WBC 9.4 4.0 - 10.5 K/uL   RBC 4.09 (L) 4.22 - 5.81 MIL/uL   Hemoglobin 10.9 (L) 13.0 - 17.0 g/dL   HCT 16.1 (L) 09.6 - 04.5 %   MCV 83.6 80.0 - 100.0 fL   MCH 26.7 26.0 - 34.0 pg   MCHC 31.9 30.0 - 36.0 g/dL   RDW 40.9 81.1 - 91.4 %   Platelets 322 150 - 400 K/uL   nRBC 0.0 0.0 - 0.2 %    Comment: Performed at [facility], [address] [place], [place] [address]  Comprehensive metabolic panel     Status: Abnormal   Collection  Time: 06/05/18  1:37 PM  Result Value Ref Range   Sodium 138 135 - 145 mmol/L   Potassium 4.0 3.5 - 5.1 mmol/L   Chloride 106 98 - 111 mmol/L   CO2 26 22 - 32 mmol/L   Glucose, Bld 215 (H) 70 - 99 mg/dL   BUN 18 6 - 20 mg/dL   Creatinine, Ser 6.21 (H) 0.61 - 1.24 mg/dL   Calcium 8.8 (L) 8.9 - 10.3 mg/dL   Total Protein 6.9 6.5 - 8.1 g/dL   Albumin 3.0 (L) 3.5 - 5.0 g/dL   AST 19 15 - 41 U/L   ALT 18 0 - 44 U/L   Alkaline Phosphatase 91 38 - 126 U/L   Total Bilirubin 0.7 0.3 - 1.2 mg/dL   GFR calc non Af Amer 51 (L) >60 mL/min   GFR calc Af Amer 59 (L) >60 mL/min   Anion gap 6 5 - 15    Comment: Performed at [facility], [address] [place], [place] [address]  Troponin I - ONCE - STAT     Status: None   Collection Time: 06/05/18  1:37 PM  Result Value Ref Range   Troponin I <0.03 <0.03 ng/mL    Comment: Performed at [facility], [address] [place], [place] [address]  Brain natriuretic peptide     Status: Abnormal   Collection Time: 06/05/18  1:37 PM  Result Value Ref Range   B Natriuretic Peptide 234.0 (H) 0.0 - 100.0 pg/mL    Comment: Performed at [facility], [address] [place], [place] [address]  Hemoglobin A1c     Status: Abnormal   Collection Time: 06/05/18  3:48 PM  Result Value Ref Range   Hgb A1c MFr Bld 8.1 (H) 4.8 - 5.6 %    Comment: (NOTE) Pre diabetes:          5.7%-6.4% Diabetes:              >6.4% Glycemic control for   <7.0% adults with diabetes    Mean Plasma Glucose 185.77 mg/dL    Comment: Performed at [facility], [address] [place], [place] [address]  Glucose, capillary     Status: Abnormal   Collection Time: 06/05/18  5:26 PM  Result Value Ref Range   Glucose-Capillary 183 (H) 70 - 99 mg/dL  HIV antibody (Routine Testing)     Status: None   Collection Time: 06/05/18  5:27 PM  Result Value Ref Range   HIV Screen 4th Generation wRfx Non Reactive Non Reactive    Comment: (NOTE) Performed At:  [facility] [address] [place], [place] [id number] Jolene Schimke MD [id number]   CBC     Status: Abnormal   Collection Time: 06/05/18  5:27 PM  Result Value Ref Range   WBC 10.5 4.0 - 10.5 K/uL   RBC 3.95 (L) 4.22 - 5.81 MIL/uL   Hemoglobin 10.3 (L) 13.0 - 17.0 g/dL   HCT 02.7 (L) 25.3 - 66.4 %   MCV 81.8 80.0 - 100.0 fL   MCH 26.1 26.0 - 34.0 pg   MCHC 31.9 30.0 - 36.0 g/dL   RDW 40.3 47.4 - 25.9 %   Platelets 326 150 - 400 K/uL   nRBC 0.0 0.0 - 0.2 %    Comment: Performed at [facility], [address] [place], [place] [address]  Creatinine, serum     Status: Abnormal   Collection Time: 06/05/18  5:27 PM  Result Value Ref Range   Creatinine, Ser 1.66 (H) 0.61 - 1.24 mg/dL   GFR calc non Af Amer 49 (L) >60 mL/min   GFR calc Af Amer 57 (L) >60 mL/min    Comment: Performed at [facility], [address] [place], [place] [address]  TSH     Status: None   Collection Time: 06/05/18  5:27 PM  Result Value Ref Range   TSH 4.026 0.350 - 4.500 uIU/mL    Comment: Performed by a 3rd Generation assay with a functional sensitivity of <=0.01 uIU/mL. Performed at [facility], [address] [place], [place] [address]   Troponin I - Now Then Q6H     Status: Abnormal   Collection Time: 06/05/18  5:27 PM  Result Value Ref Range   Troponin I 0.03 (HH) <0.03 ng/mL    Comment: CRITICAL RESULT CALLED TO, READ BACK BY AND VERIFIED WITH  ERICA MORRALIS AT 1813 ON 06/05/18 BY SNJ Performed at [facility], [address] [place], [place] [address]   Glucose, capillary     Status: Abnormal   Collection Time: 06/05/18  9:17 PM  Result Value Ref Range   Glucose-Capillary 231 (H) 70 - 99 mg/dL  Troponin I - Now Then Q6H     Status: Abnormal   Collection Time: 06/05/18 11:06 PM  Result Value Ref Range   Troponin I 0.04 (HH) <0.03 ng/mL    Comment: CRITICAL VALUE NOTED. VALUE IS CONSISTENT WITH PREVIOUSLY REPORTED/CALLED VALUE/ MLK  Performed at [facility], [address] [place], [place] [address]   Urinalysis, Routine w reflex microscopic     Status: Abnormal   Collection Time: 06/05/18 11:43 PM  Result Value Ref Range   Color, Urine YELLOW (A) YELLOW   APPearance CLOUDY (A) CLEAR   Specific Gravity, Urine 1.010 1.005 - 1.030   pH 6.0 5.0 - 8.0   Glucose, UA 50 (A) NEGATIVE mg/dL   Hgb urine dipstick NEGATIVE NEGATIVE   Bilirubin Urine NEGATIVE NEGATIVE   Ketones, ur NEGATIVE NEGATIVE mg/dL   Protein, ur 098 (A) NEGATIVE mg/dL   Nitrite NEGATIVE NEGATIVE   Leukocytes, UA NEGATIVE NEGATIVE   RBC / HPF 6-10 0 - 5 RBC/hpf   WBC, UA 6-10 0 - 5 WBC/hpf   Bacteria, UA NONE SEEN NONE SEEN   Squamous Epithelial / LPF NONE SEEN 0 - 5   Mucus PRESENT    Hyaline Casts, UA PRESENT    Sperm, UA PRESENT     Comment: Performed at [facility], [address] [place], [place] [address]  Troponin I - Now Then Q6H     Status: Abnormal   Collection Time: 06/06/18  5:19 AM  Result Value Ref Range   Troponin I 0.19 (HH) <0.03 ng/mL    Comment: CRITICAL VALUE NOTED. VALUE IS CONSISTENT WITH PREVIOUSLY REPORTED/CALLED VALUE KBH Performed at [facility], [address] [place], [place] [address]   CBC     Status: Abnormal   Collection Time: 06/06/18  5:19 AM  Result Value Ref Range   WBC 9.2 4.0 - 10.5 K/uL   RBC 3.28 (L) 4.22 - 5.81 MIL/uL   Hemoglobin 8.5 (L) 13.0 - 17.0 g/dL   HCT 62.1 (L) 30.8 - 65.7 %   MCV 82.9 80.0 - 100.0 fL   MCH 25.9 (L) 26.0 - 34.0 pg   MCHC 31.3 30.0 - 36.0 g/dL   RDW 84.6 96.2 - 95.2 %   Platelets 287 150 - 400 K/uL   nRBC 0.0 0.0 - 0.2 %    Comment: Performed at [facility], [address] [place], [place] [address]  Basic metabolic panel     Status: Abnormal   Collection Time: 06/06/18  5:19 AM  Result Value Ref Range   Sodium 138 135 - 145 mmol/L   Potassium 3.4 (L) 3.5 - 5.1 mmol/L   Chloride 105 98 - 111 mmol/L   CO2 24 22 - 32  mmol/L   Glucose, Bld 190 (H) 70 - 99 mg/dL   BUN 25 (H) 6 - 20 mg/dL   Creatinine, Ser 4.40 (H) 0.61 - 1.24 mg/dL   Calcium 8.0 (L) 8.9 - 10.3 mg/dL   GFR calc non Af Amer 29 (L) >60 mL/min   GFR calc Af Amer 34 (L) >60 mL/min   Anion gap 9 5 - 15    Comment: Performed at [facility], [address], [place], [place] [address]  Glucose, capillary  Status: Abnormal   Collection Time: 06/06/18  7:36 AM  Result Value Ref Range   Glucose-Capillary 137 (H) 70 - 99 mg/dL  Glucose, capillary     Status: Abnormal   Collection Time: 06/06/18 12:17 PM  Result Value Ref Range   Glucose-Capillary 256 (H) 70 - 99 mg/dL  ECHOCARDIOGRAM COMPLETE     Status: None   Collection Time: 06/06/18  1:59 PM  Result Value Ref Range   Weight 3,977.6 oz   Height 72 in   BP 128/63 mmHg  Glucose, capillary     Status: Abnormal   Collection Time: 06/06/18  3:40 PM  Result Value Ref Range   Glucose-Capillary 207 (H) 70 - 99 mg/dL  Troponin I - Once     Status: Abnormal   Collection Time: 06/06/18  4:32 PM  Result Value Ref Range   Troponin I 0.25 (HH) <0.03 ng/mL    Comment: CRITICAL VALUE NOTED. VALUE IS CONSISTENT WITH PREVIOUSLY REPORTED/CALLED VALUE/JUW Performed at [facility], [address] [place], [place] [address]   Glucose, capillary     Status: Abnormal   Collection Time: 06/06/18  8:57 PM  Result Value Ref Range   Glucose-Capillary 110 (H) 70 - 99 mg/dL  Protein / creatinine ratio, urine     Status: Abnormal   Collection Time: 06/07/18 12:46 AM  Result Value Ref Range   Creatinine, Urine 368 mg/dL   Total Protein, Urine 359 mg/dL    Comment: RESULT CONFIRMED BY MANUAL DILUTION SRC NO NORMAL RANGE ESTABLISHED FOR THIS TEST    Protein Creatinine Ratio 0.98 (H) 0.00 - 0.15 mg/mg[Cre]    Comment: Performed at [facility], [address] [place], [place] [address]  Basic metabolic panel     Status: Abnormal   Collection Time: 06/07/18  4:17 AM  Result  Value Ref Range   Sodium 138 135 - 145 mmol/L   Potassium 3.5 3.5 - 5.1 mmol/L   Chloride 107 98 - 111 mmol/L   CO2 25 22 - 32 mmol/L   Glucose, Bld 133 (H) 70 - 99 mg/dL   BUN 26 (H) 6 - 20 mg/dL   Creatinine, Ser 0.98 (H) 0.61 - 1.24 mg/dL   Calcium 7.9 (L) 8.9 - 10.3 mg/dL   GFR calc non Af Amer 27 (L) >60 mL/min   GFR calc Af Amer 32 (L) >60 mL/min   Anion gap 6 5 - 15    Comment: Performed at [facility], [address] [place], [place] [address]  CBC     Status: Abnormal   Collection Time: 06/07/18  4:17 AM  Result Value Ref Range   WBC 8.2 4.0 - 10.5 K/uL   RBC 3.30 (L) 4.22 - 5.81 MIL/uL   Hemoglobin 8.7 (L) 13.0 - 17.0 g/dL   HCT 78.2 (L) 95.6 - 21.3 %   MCV 81.8 80.0 - 100.0 fL   MCH 26.4 26.0 - 34.0 pg   MCHC 32.2 30.0 - 36.0 g/dL   RDW 08.6 57.8 - 46.9 %   Platelets 267 150 - 400 K/uL   nRBC 0.0 0.0 - 0.2 %    Comment: Performed at [facility], [address] [place], [place] [address]  Magnesium     Status: None   Collection Time: 06/07/18  4:17 AM  Result Value Ref Range   Magnesium 2.1 1.7 - 2.4 mg/dL    Comment: Performed at [facility], [address] [place], [place] [address]  Glucose,  capillary     Status: Abnormal   Collection Time: 06/07/18  8:05 AM  Result Value Ref Range   Glucose-Capillary 112 (H) 70 - 99 mg/dL  CULTURE, BLOOD (ROUTINE X 2) w Reflex to ID Panel     Status: None   Collection Time: 06/07/18  8:26 AM  Result Value Ref Range   Specimen Description BLOOD LEFT AC    Special Requests      BOTTLES DRAWN AEROBIC AND ANAEROBIC Blood Culture adequate volume   Culture      NO GROWTH 5 DAYS Performed at [facility], [address] [place], [place] [address]    Report Status 06/12/2018 FINAL   Troponin I - Once     Status: Abnormal   Collection Time: 06/07/18  8:26 AM  Result Value Ref Range   Troponin I 0.54 (HH) <0.03 ng/mL    Comment: CRITICAL VALUE NOTED. VALUE IS CONSISTENT WITH  PREVIOUSLY REPORTED/CALLED VALUE KBH Performed at [facility], [address] [place], [place] [address]   CULTURE, BLOOD (ROUTINE X 2) w Reflex to ID Panel     Status: None   Collection Time: 06/07/18  8:30 AM  Result Value Ref Range   Specimen Description BLOOD LEFT HAND    Special Requests      BOTTLES DRAWN AEROBIC AND ANAEROBIC Blood Culture adequate volume   Culture      NO GROWTH 5 DAYS Performed at [facility], [address] [place], [place] [address]    Report Status 06/12/2018 FINAL   Glucose, capillary     Status: Abnormal   Collection Time: 06/07/18 11:39 AM  Result Value Ref Range   Glucose-Capillary 213 (H) 70 - 99 mg/dL  Glucose, capillary     Status: Abnormal   Collection Time: 06/07/18  4:35 PM  Result Value Ref Range   Glucose-Capillary 192 (H) 70 - 99 mg/dL  Ammonia     Status: None   Collection Time: 06/07/18  7:42 PM  Result Value Ref Range   Ammonia 21 9 - 35 umol/L    Comment: Performed at [facility], [address] [place], [place] [address]  Glucose, capillary     Status: Abnormal   Collection Time: 06/07/18  9:06 PM  Result Value Ref Range   Glucose-Capillary 146 (H) 70 - 99 mg/dL   Comment 1 Notify RN   Basic metabolic panel     Status: Abnormal   Collection Time: 06/08/18  4:29 AM  Result Value Ref Range   Sodium 137 135 - 145 mmol/L   Potassium 3.6 3.5 - 5.1 mmol/L   Chloride 107 98 - 111 mmol/L   CO2 23 22 - 32 mmol/L   Glucose, Bld 145 (H) 70 - 99 mg/dL   BUN 26 (H) 6 - 20 mg/dL   Creatinine, Ser 1.58 (H) 0.61 - 1.24 mg/dL   Calcium 7.8 (L) 8.9 - 10.3 mg/dL   GFR calc non Af Amer 31 (L) >60 mL/min   GFR calc Af Amer 36 (L) >60 mL/min   Anion gap 7 5 - 15    Comment: Performed at [facility], [address] [place], [place] [address]  Glucose, capillary     Status: Abnormal   Collection Time: 06/08/18  5:46 AM  Result Value Ref Range   Glucose-Capillary 147 (H) 70 - 99 mg/dL   Comment 1  Notify RN   Magnesium     Status: None   Collection Time: 06/08/18  6:32 AM  Result Value Ref Range   Magnesium 2.0 1.7 - 2.4 mg/dL    Comment: Performed at [facility], [address] [place], [place] [address]  Phosphorus     Status: None   Collection Time: 06/08/18  6:32 AM  Result Value Ref Range   Phosphorus 4.0 2.5 - 4.6 mg/dL    Comment: Performed at [facility], [address] [place], [place] [address]  Hepatic function panel     Status: Abnormal   Collection Time: 06/08/18  6:32 AM  Result Value Ref Range   Total Protein 5.8 (L) 6.5 - 8.1 g/dL   Albumin 2.6 (L) 3.5 - 5.0 g/dL   AST 15 15 - 41 U/L   ALT 14 0 - 44 U/L   Alkaline Phosphatase 69 38 - 126 U/L   Total Bilirubin 0.7 0.3 - 1.2 mg/dL   Bilirubin, Direct <0.9 0.0 - 0.2 mg/dL   Indirect Bilirubin NOT CALCULATED 0.3 - 0.9 mg/dL    Comment: Performed at [facility], [address] [place], [place] [address]  Calcium, ionized     Status: None   Collection Time: 06/08/18  6:32 AM  Result Value Ref Range   Calcium, Ionized, Serum 4.6 4.5 - 5.6 mg/dL    Comment: (NOTE) Performed At: [facility] [address] [place], [place] [id number] Jolene Schimke MD [id number]   Troponin I - Once     Status: Abnormal   Collection Time: 06/08/18  6:32 AM  Result Value Ref Range   Troponin I 0.44 (HH) <0.03 ng/mL    Comment: CRITICAL VALUE NOTED. VALUE IS CONSISTENT WITH PREVIOUSLY REPORTED/CALLED VALUE  SDR Performed at [facility], [address] [place], [place] [address]   Folate     Status: None   Collection Time: 06/08/18  6:32 AM  Result Value Ref Range   Folate 10.5 >5.9 ng/mL    Comment: Performed at [facility], [address] [place], [place] [address]  RPR     Status: None   Collection Time: 06/08/18  6:32 AM  Result Value Ref Range   RPR Ser Ql Non Reactive Non Reactive    Comment: (NOTE) Performed At: [facility] [address] [place], [place] [id number] Jolene Schimke MD [id number]   Vitamin B12     Status: Abnormal   Collection Time: 06/08/18  6:33 AM  Result Value Ref Range   Vitamin B-12 177 (L) 180 - 914 pg/mL    Comment: (NOTE) This assay is not validated for testing neonatal or myeloproliferative syndrome specimens for Vitamin B12 levels. Performed at [facility], [address] [place], [place] [address]   Ammonia     Status: None   Collection Time: 06/08/18  6:33 AM  Result Value Ref Range   Ammonia 28 9 - 35 umol/L    Comment: Performed at [facility], [address] [place], [place] [address]  Glucose, capillary     Status: Abnormal   Collection Time: 06/08/18  8:04 AM  Result Value Ref Range   Glucose-Capillary 145 (H) 70 - 99 mg/dL   Comment 1 Notify RN   Urine Drug Screen, Qualitative ([facility] only)     Status: None   Collection Time: 06/08/18 10:51 AM  Result Value Ref Range   Tricyclic, Ur Screen NONE DETECTED NONE DETECTED   Amphetamines, Ur Screen NONE DETECTED NONE DETECTED   MDMA (Ecstasy)Ur Screen NONE DETECTED NONE DETECTED  Cocaine Metabolite,Ur [place] NONE DETECTED NONE DETECTED   Opiate, Ur Screen NONE DETECTED NONE DETECTED   Phencyclidine (PCP) Ur S NONE DETECTED NONE DETECTED   Cannabinoid 50 Ng, Ur [place] NONE DETECTED NONE DETECTED   Barbiturates, Ur Screen NONE DETECTED NONE DETECTED   Benzodiazepine, Ur Scrn NONE DETECTED NONE DETECTED   Methadone Scn, Ur NONE DETECTED NONE DETECTED    Comment: (NOTE) Tricyclics + metabolites, urine    Cutoff 1000 ng/mL Amphetamines + metabolites, urine  Cutoff 1000 ng/mL MDMA (Ecstasy), urine              Cutoff 500 ng/mL Cocaine Metabolite, urine          Cutoff 300 ng/mL Opiate + metabolites, urine        Cutoff 300 ng/mL Phencyclidine (PCP), urine         Cutoff 25 ng/mL Cannabinoid, urine                 Cutoff 50 ng/mL Barbiturates + metabolites, urine  Cutoff 200 ng/mL Benzodiazepine, urine               Cutoff 200 ng/mL Methadone, urine                   Cutoff 300 ng/mL The urine drug screen provides only a preliminary, unconfirmed analytical test result and should not be used for non-medical purposes. Clinical consideration and professional judgment should be applied to any positive drug screen result due to possible interfering substances. A more specific alternate chemical method must be used in order to obtain a confirmed analytical result. Gas chromatography / mass spectrometry (GC/MS) is the preferred confirmat ory method. Performed at [facility], [address] [place], [place] [address]   Glucose, capillary     Status: Abnormal   Collection Time: 06/08/18 12:00 PM  Result Value Ref Range   Glucose-Capillary 179 (H) 70 - 99 mg/dL   Comment 1 Notify RN   Glucose, capillary     Status: Abnormal   Collection Time: 06/08/18  4:48 PM  Result Value Ref Range   Glucose-Capillary 160 (H) 70 - 99 mg/dL  Glucose, capillary     Status: Abnormal   Collection Time: 06/08/18  9:00 PM  Result Value Ref Range   Glucose-Capillary 137 (H) 70 - 99 mg/dL   Comment 1 Notify RN    Comment 2 Document in Chart   Basic metabolic panel     Status: Abnormal   Collection Time: 06/09/18  4:26 AM  Result Value Ref Range   Sodium 140 135 - 145 mmol/L   Potassium 3.8 3.5 - 5.1 mmol/L   Chloride 111 98 - 111 mmol/L   CO2 24 22 - 32 mmol/L   Glucose, Bld 123 (H) 70 - 99 mg/dL   BUN 21 (H) 6 - 20 mg/dL   Creatinine, Ser 9.81 (H) 0.61 - 1.24 mg/dL   Calcium 7.6 (L) 8.9 - 10.3 mg/dL   GFR calc non Af Amer 38 (L) >60 mL/min   GFR calc Af Amer 45 (L) >60 mL/min   Anion gap 5 5 - 15    Comment: Performed at [facility], [address] [place], [place] [address]  Glucose, capillary     Status: Abnormal   Collection Time: 06/09/18  8:05 AM  Result Value Ref Range   Glucose-Capillary 125 (H) 70 - 99 mg/dL   Comment 1 Notify RN    Comment 2 Document in Chart  Radiology  No results found.  Assessment/Plan Hyperlipidemia lipid control important in reducing the progression of atherosclerotic disease. Continue statin therapy   Essential hypertension blood pressure control important in reducing the progression of atherosclerotic disease. On appropriate oral medications.   Type 2 diabetes mellitus with circulatory disorder (HCC) blood glucose control important in reducing the progression of atherosclerotic disease. Also, involved in wound healing. On appropriate medications.  Swelling of limb No DVT, superficial thrombophlebitis, or venous reflux was identified but prominent lymph nodes were present bilaterally likely signifying lymphedema as the cause of the swelling.  Recommend elevation, compression, and increasing activity as tolerated.  Currently, his swelling symptoms are fairly mild.  Atherosclerosis of native arteries of the extremities with ulceration (HCC) His noninvasive studies today demonstrate a right ABI of 1.01 and a left ABI of 0.82.  His digital pressures are 122 on the right and 90 on the left. Perfusion is doing well.  No current limb threatening symptoms.  Plan to recheck in 1 year.    Festus Barren, MD  09/01/2018 12:07 PM    This note was created with Dragon medical transcription system.  Any errors from dictation are purely unintentional

## 2019-03-14 IMAGING — CR DG FOOT COMPLETE 3+V*R*
1 series · 3 of 3 positions shown · non-contrast
Comparison: None.

CLINICAL DATA: Nonhealing wound of right lower extremity.

EXAM:
RIGHT FOOT COMPLETE - 3+ VIEW

[Series 1: x foot ap right · 0.14mm/px · 3 of 3 slices shown]
[im 1/3]
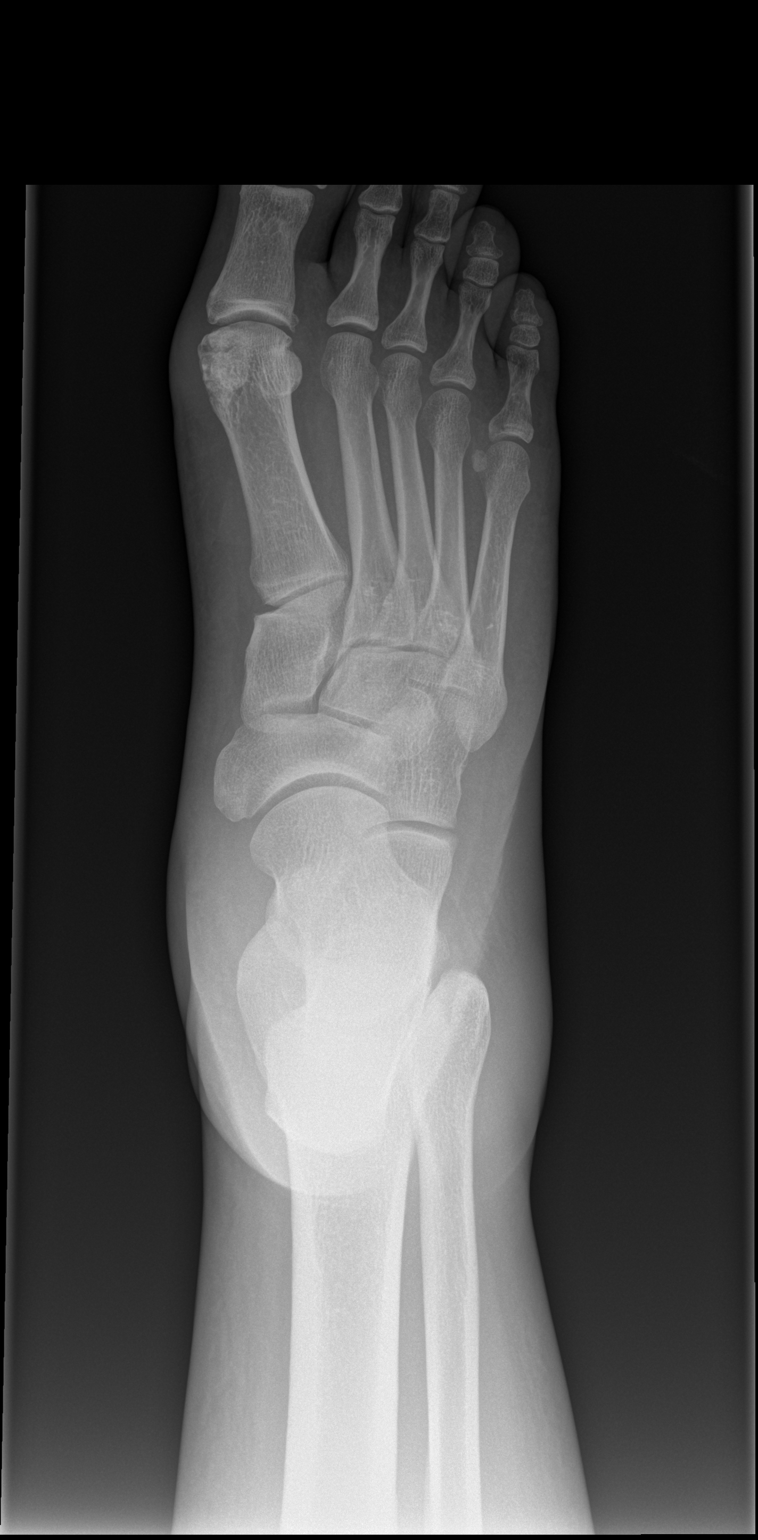
[im 2/3]
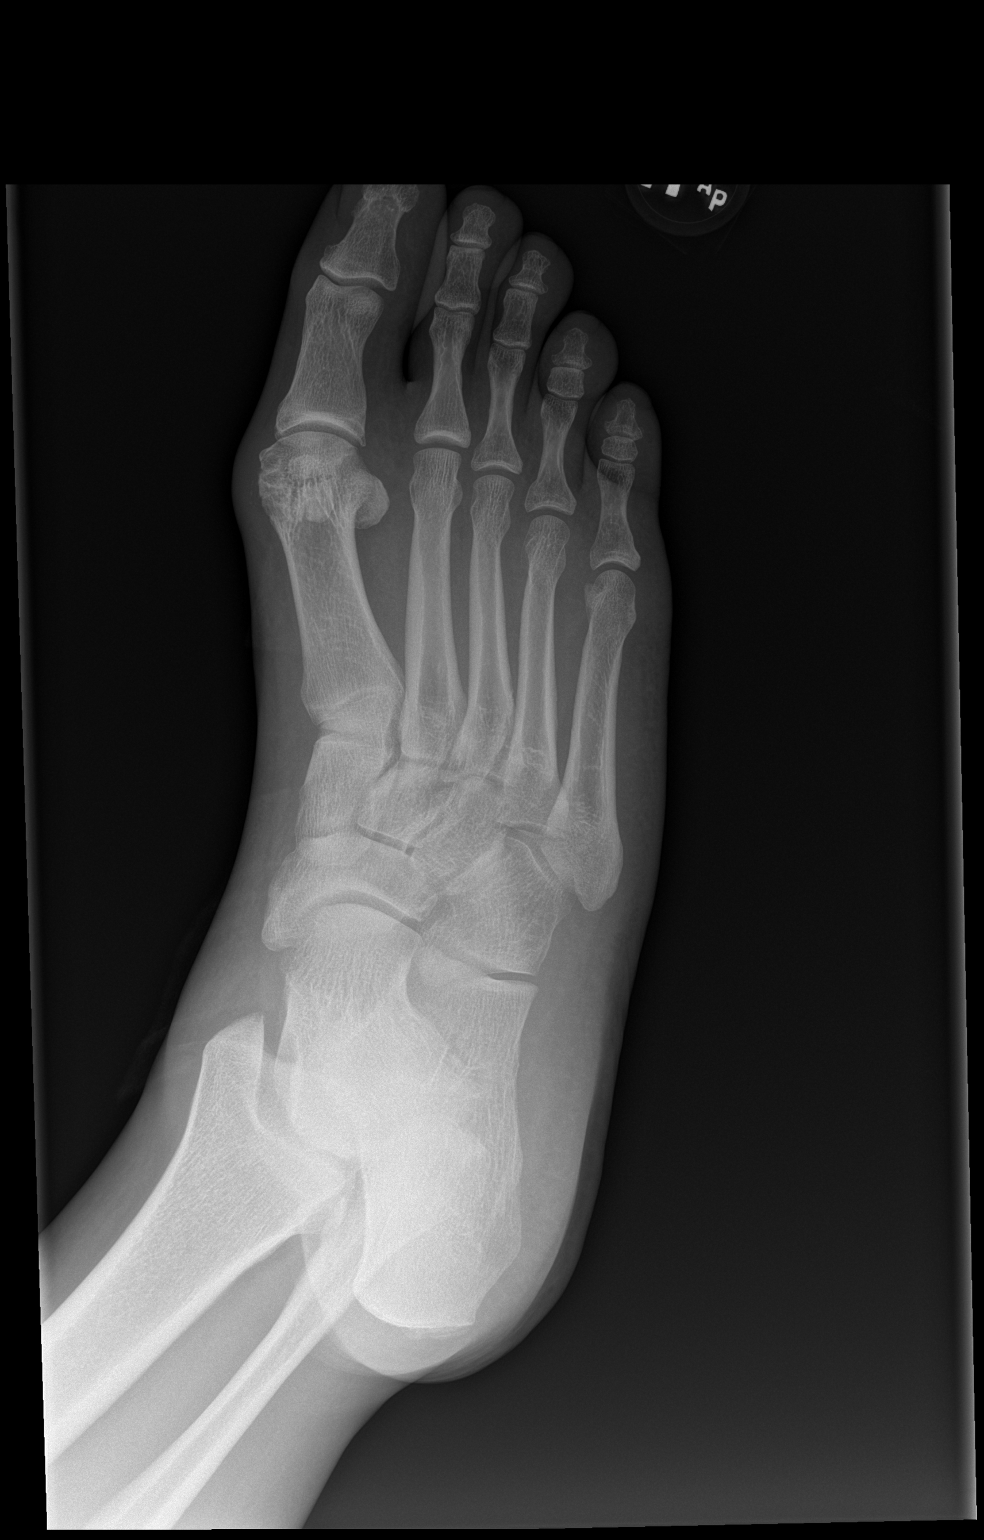
[im 3/3]
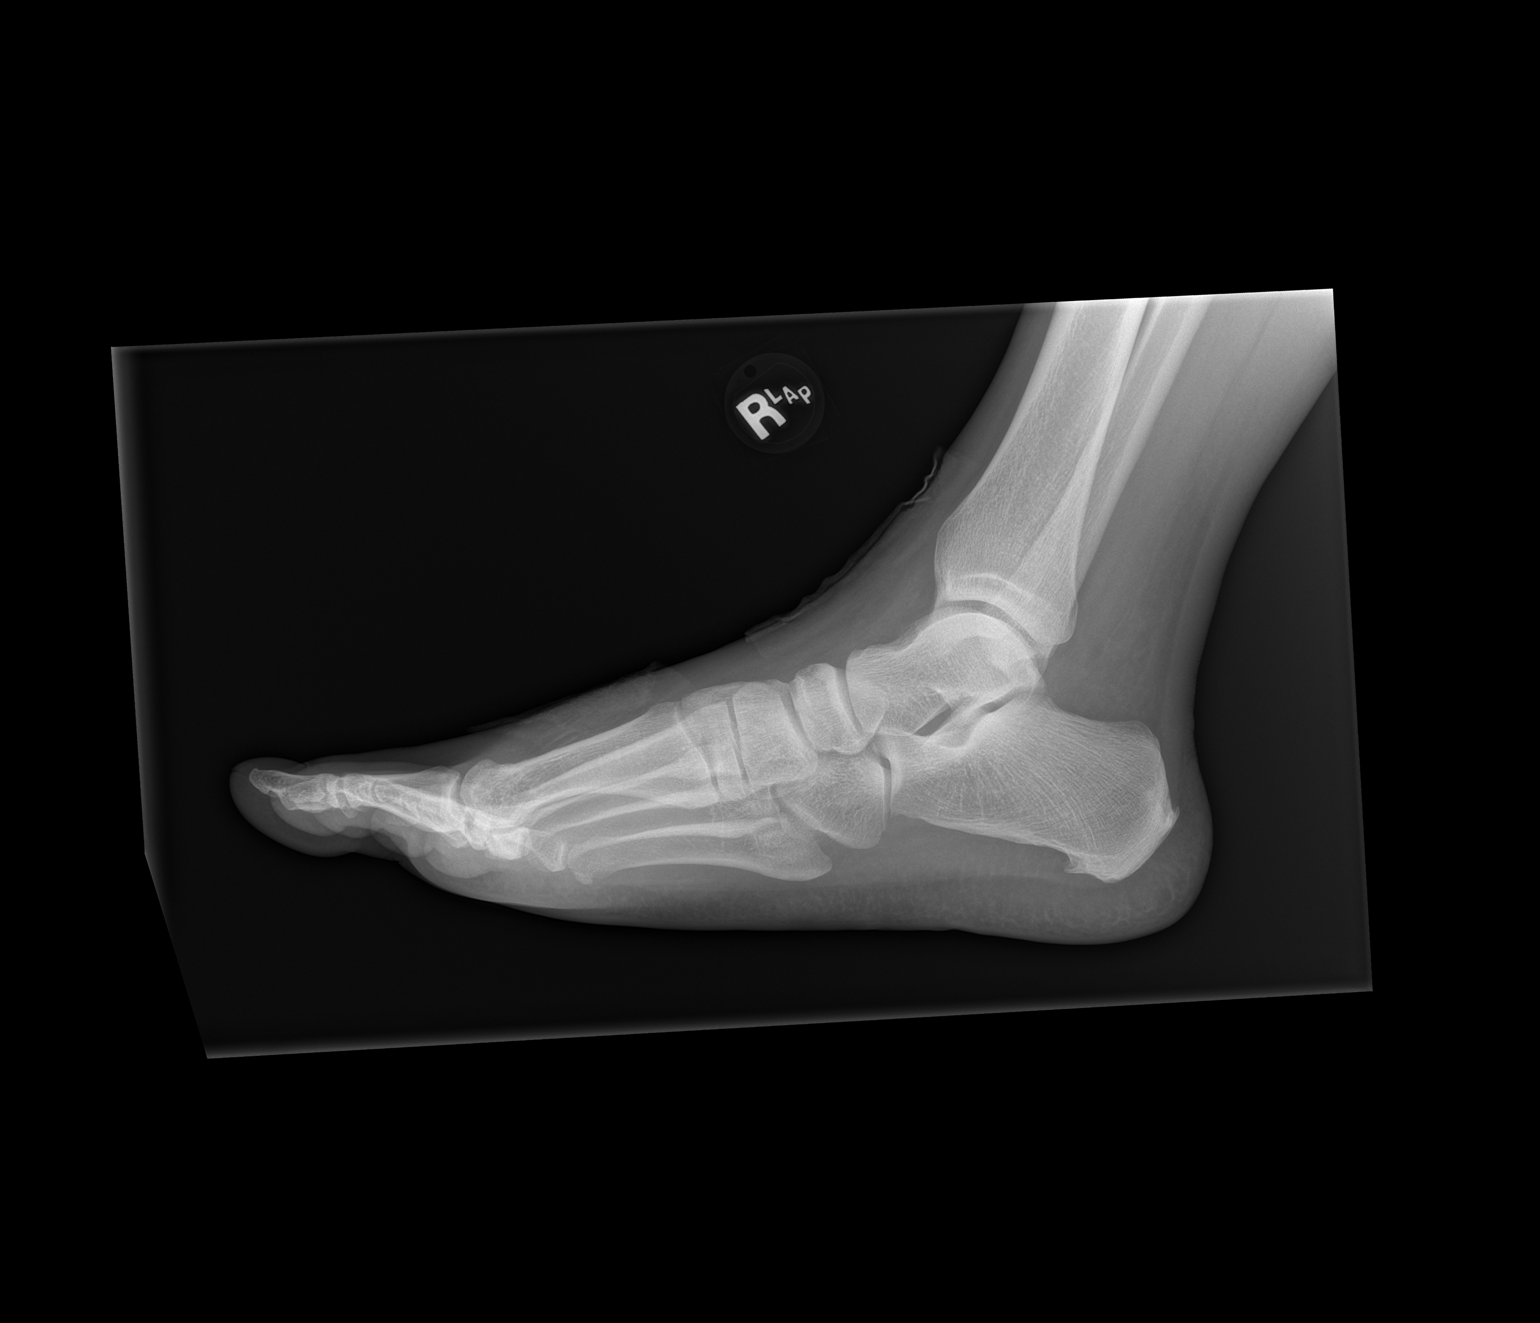

[3 of 3 positions shown; findings below may reference images not displayed]

FINDINGS: There is no evidence of fracture or dislocation. There is no
evidence of arthropathy or other focal bone abnormality. No
radiopaque foreign body is noted. Soft tissue ulceration is seen
anterior to the distal tibia.
IMPRESSION: No fracture or dislocation is noted. No lytic destruction is seen to
suggest osteomyelitis.

## 2019-06-22 IMAGING — US US RENAL
1 series · 14 of 25 positions shown · non-contrast
Comparison: None.

CLINICAL DATA: Acute renal failure

EXAM:
RENAL / URINARY TRACT ULTRASOUND COMPLETE

[Series 1: us renal · 0.23mm/px · 14 of 31 slices shown]
[im 1/31]
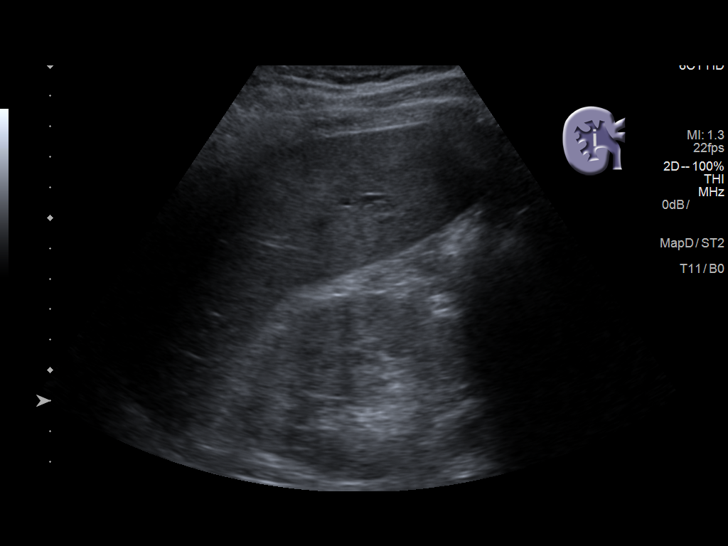
[im 3/31]
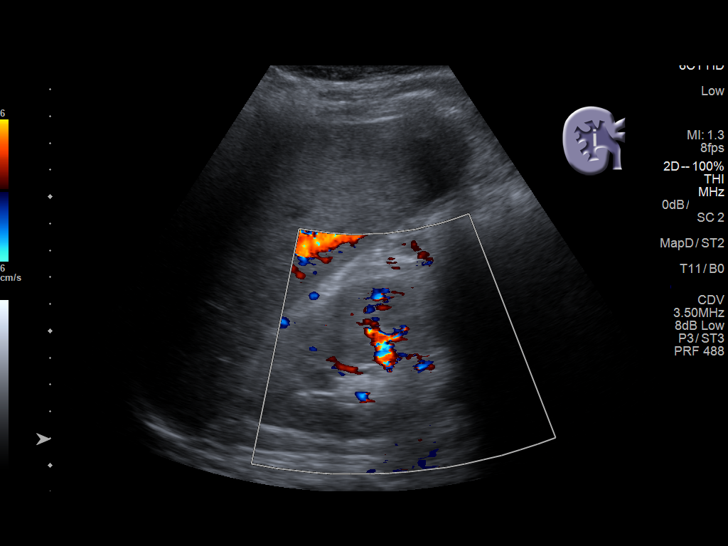
[im 6/31]
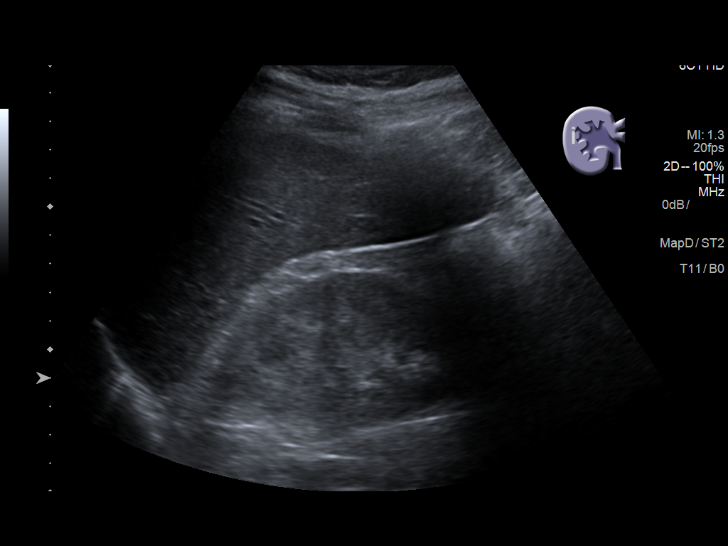
[im 8/31]
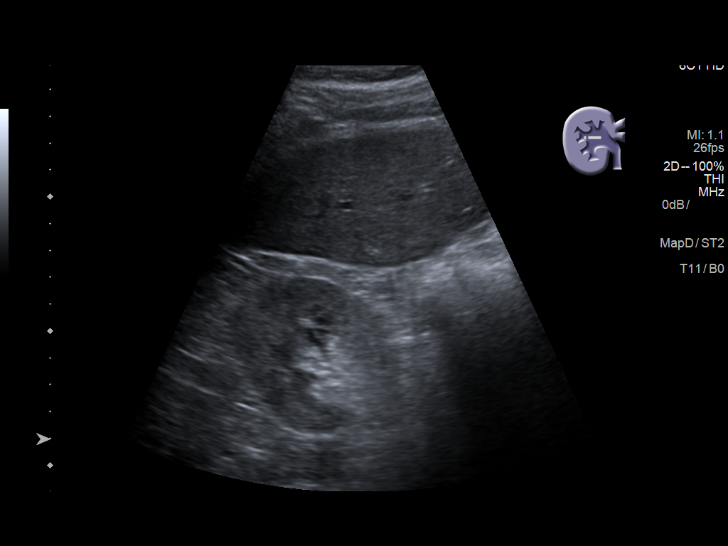
[im 11/31]
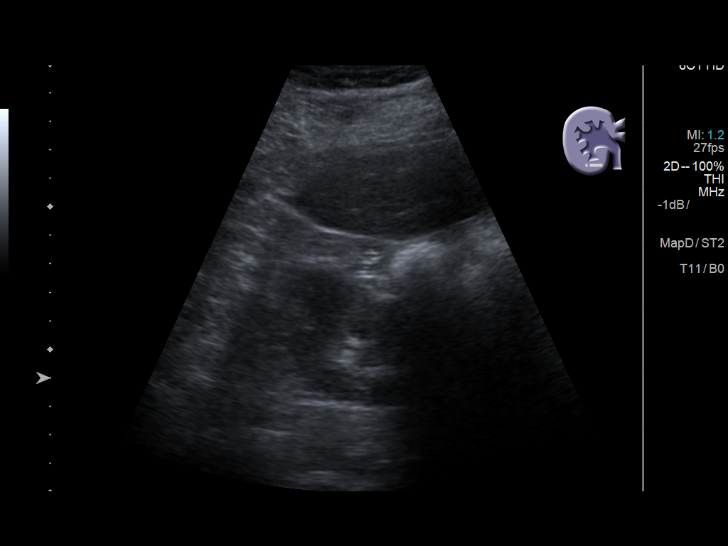
[im 12/31]
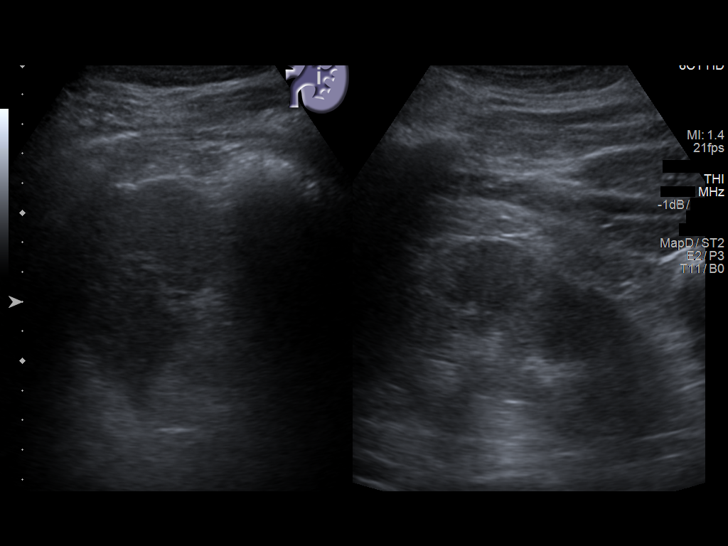
[im 14/31]
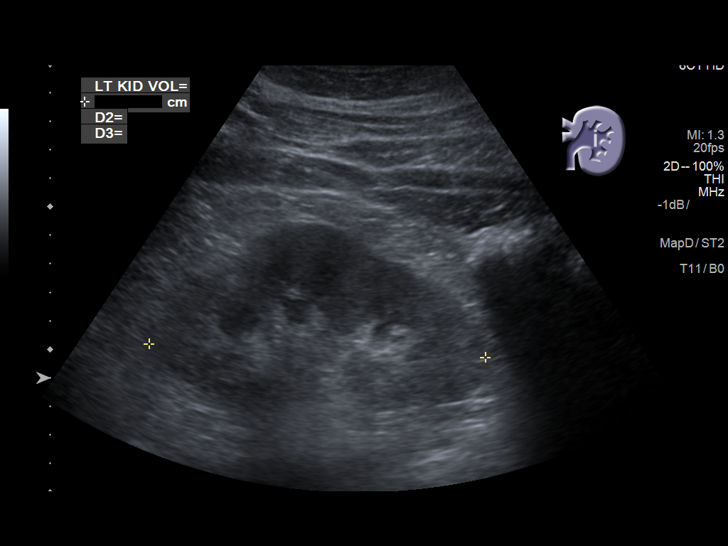
[im 17/31]
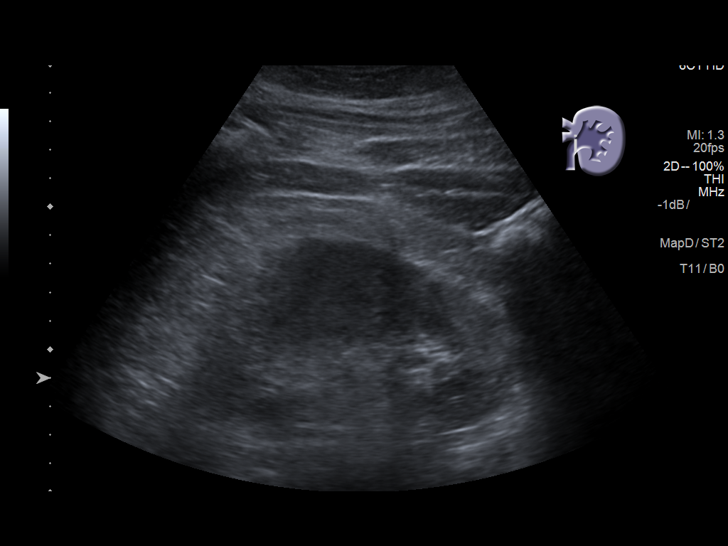
[im 19/31]
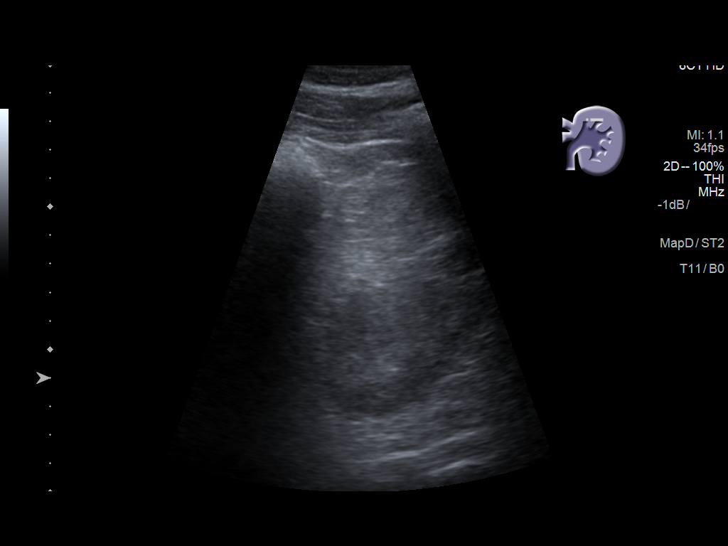
[im 21/31]
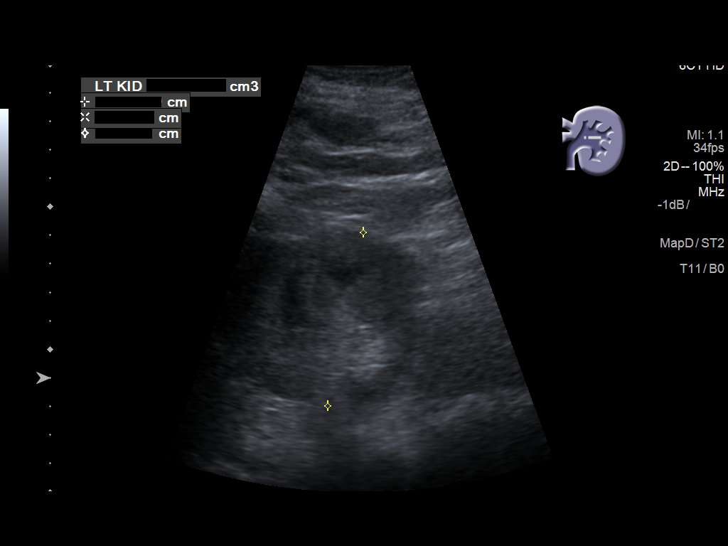
[im 23/31]
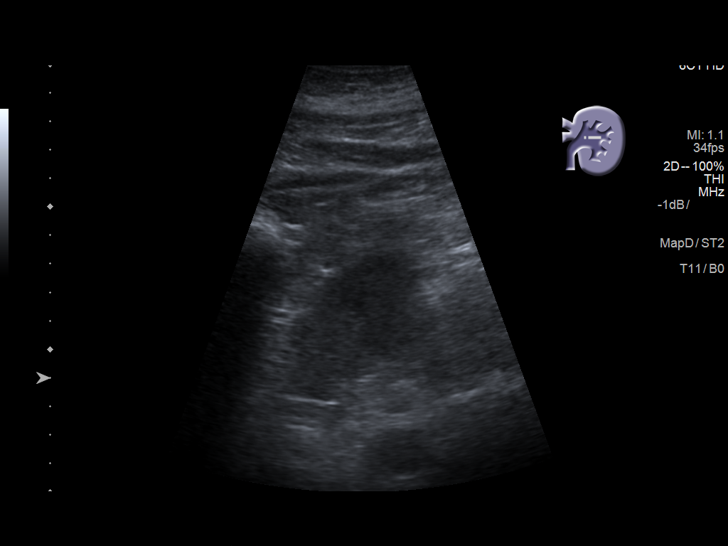
[im 26/31]
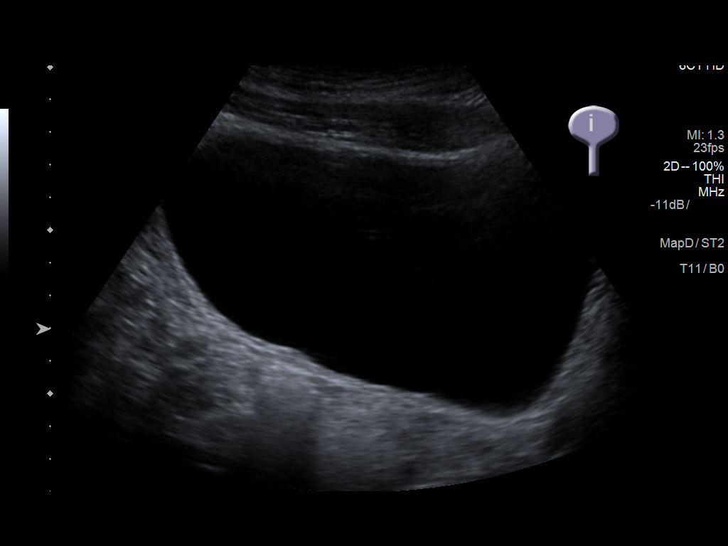
[im 28/31]
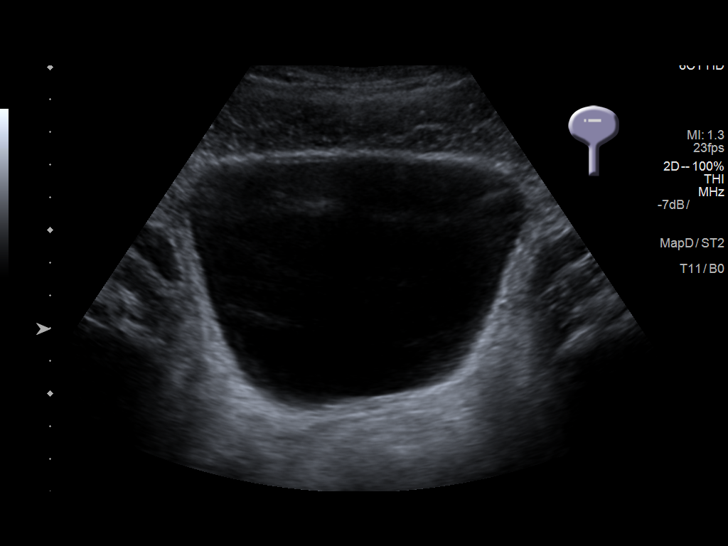
[im 31/31]
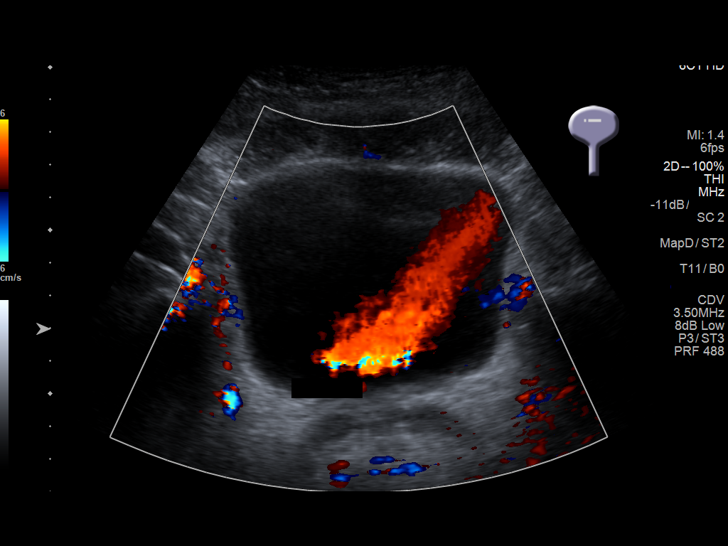

[14 of 25 positions shown; findings below may reference images not displayed]

FINDINGS: Right Kidney:

Renal measurements: 10.9 x 5.7 x 6.1 cm = volume: 199 mL. The renal
cortical echotexture exceeds that of the adjacent liver. There is no
hydronephrosis nor cystic or solid mass demonstrated.

Left Kidney:

Renal measurements: 11.8 x 6.4 x 6.2 cm = volume: 244 mL. The
cortical echotexture is increased similar to that on the right.
There is no cystic or solid mass nor hydronephrosis.

Bladder:

Appears normal for degree of bladder distention. Bilateral ureteral
jets are observed.
IMPRESSION: Increased renal cortical echotexture bilaterally consistent with
medical renal disease. No suspicious masses. No hydronephrosis.
Bilateral ureteral jets are observed in the bladder.

## 2019-08-31 ENCOUNTER — Ambulatory Visit (INDEPENDENT_AMBULATORY_CARE_PROVIDER_SITE_OTHER): Payer: Managed Care, Other (non HMO) | Admitting: Nurse Practitioner

## 2019-08-31 ENCOUNTER — Ambulatory Visit (INDEPENDENT_AMBULATORY_CARE_PROVIDER_SITE_OTHER): Payer: Managed Care, Other (non HMO) | Admitting: Vascular Surgery

## 2019-08-31 ENCOUNTER — Encounter (INDEPENDENT_AMBULATORY_CARE_PROVIDER_SITE_OTHER): Payer: Managed Care, Other (non HMO)

## 2019-10-06 DEATH — deceased

## 2020-02-04 IMAGING — DX DG CHEST 1V PORT
1 series · 1 of 1 positions shown · non-contrast
Comparison: None.

CLINICAL DATA: Cough, wheezing, and chest congestion which began at
2 a.m. this morning. Recent fever with vomiting and diarrhea lasting
1 day last week. History of diabetes, hypertension.

EXAM:
PORTABLE CHEST 1 VIEW

[chest ap]
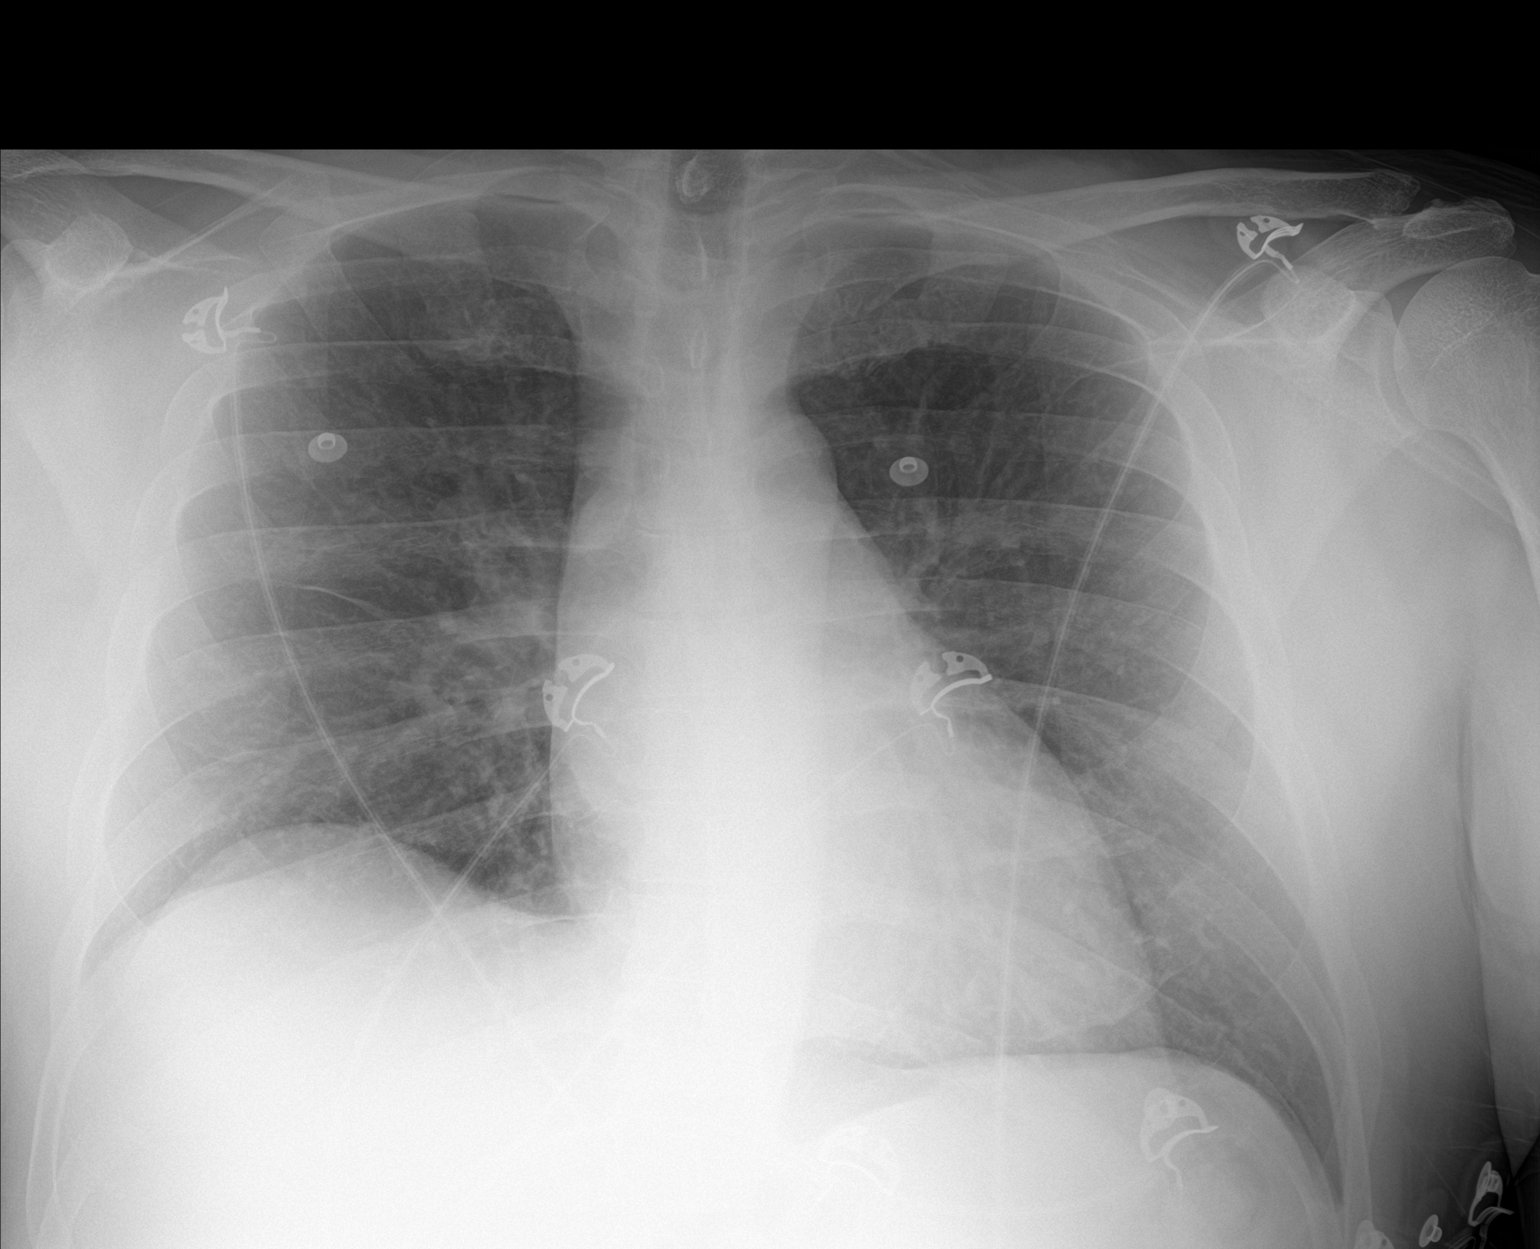

[1 of 1 positions shown; findings below may reference images not displayed]

FINDINGS: The lungs are adequately inflated. There is no focal infiltrate.
There is no pleural effusion. The right hemidiaphragm is mildly
elevated as compared to the left. The heart and pulmonary
vascularity are normal. The mediastinum is normal in width. The bony
thorax exhibits no acute abnormality.
IMPRESSION: There is no pneumonia nor other acute cardiopulmonary abnormality.
Mild elevation of the right hemidiaphragm may be normal for the
patient, but right hemidiaphragm dysfunction is not excluded. I have
no previous studies with which to compare.

## 2020-02-07 IMAGING — CT CT HEAD W/O CM
3 series · 16 of 47 positions shown, 19 images · non-contrast
Comparison: None.

CLINICAL DATA: Altered mental status.  Hypertension.

EXAM:
CT HEAD WITHOUT CONTRAST
TECHNIQUE: Contiguous axial images were obtained from the base of the skull
through the vertex without intravenous contrast.

[Series 3: head wo · axial · 0.44mm/px · z∈[+463,+588]mm · 10 of 30 slices shown, 13 images]
[im 3/30  brain]
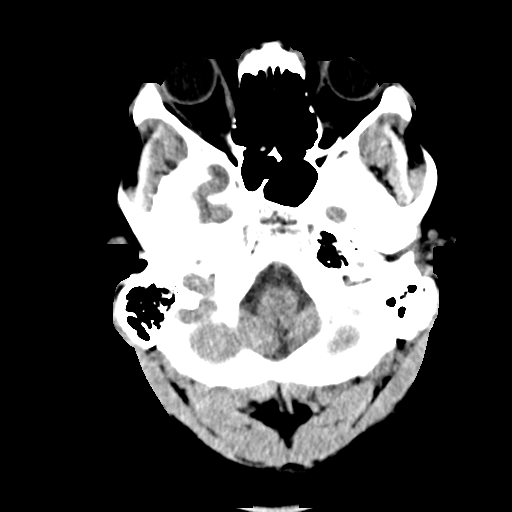
[im 3/30  bone]
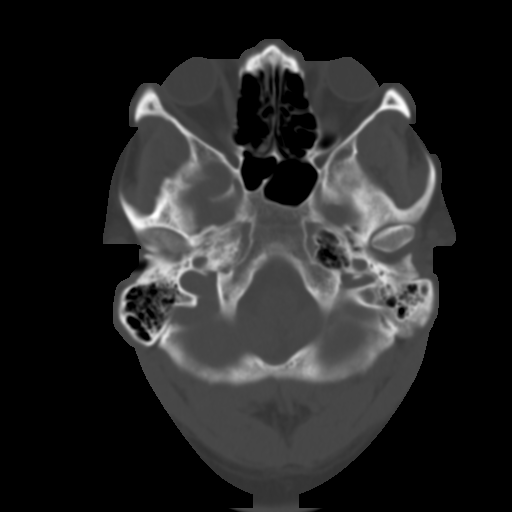
[im 6/30  brain]
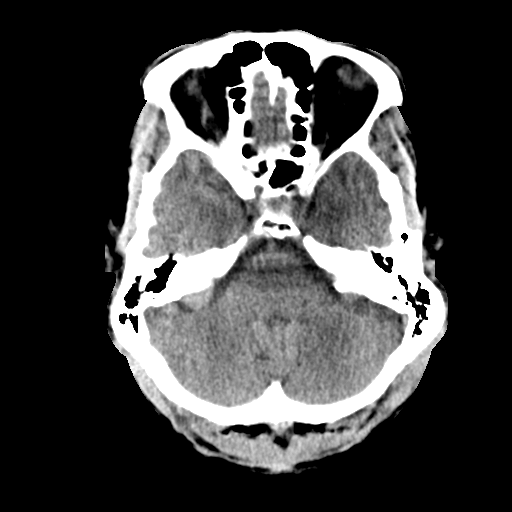
[im 9/30  brain]
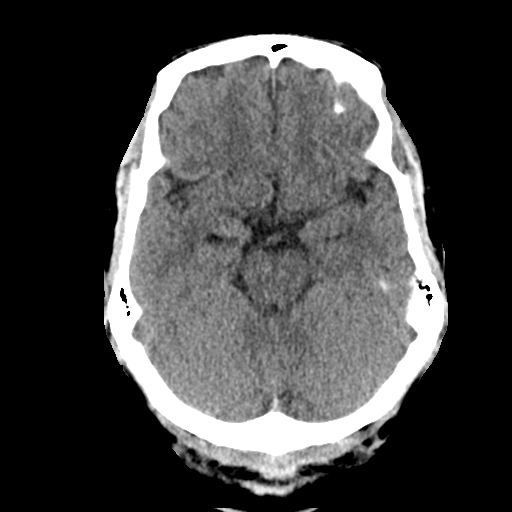
[im 11/30  brain]
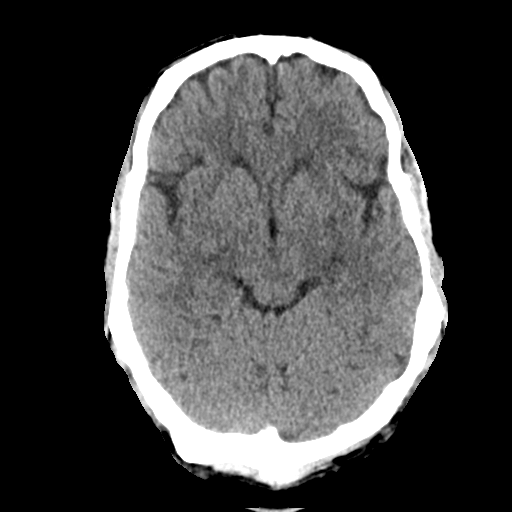
[im 14/30  brain]
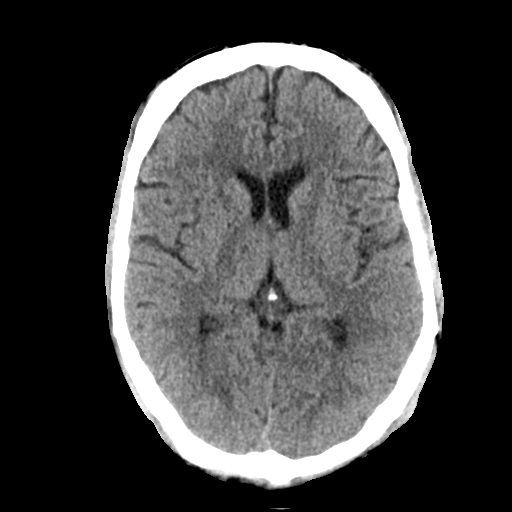
[im 14/30  bone]
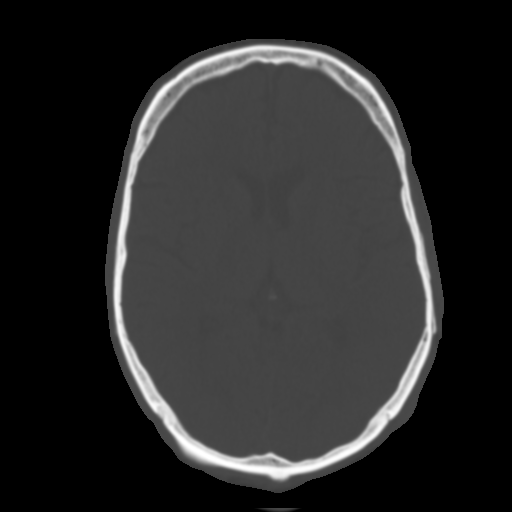
[im 17/30  brain]
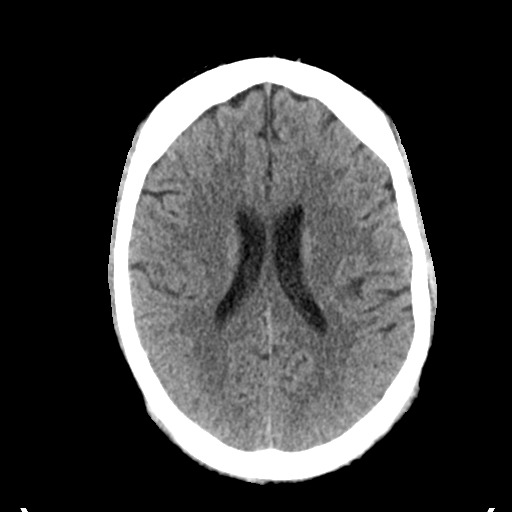
[im 20/30  brain]
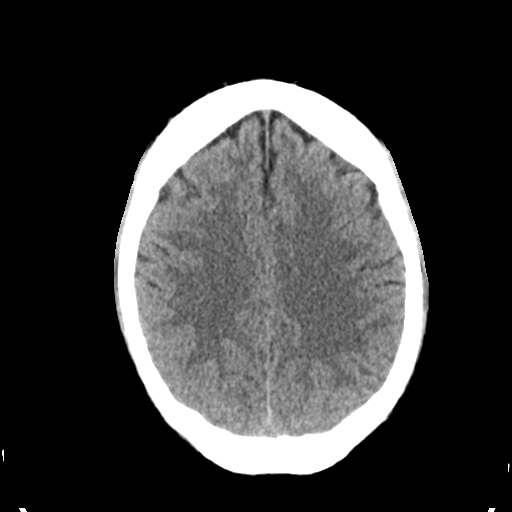
[im 23/30  brain]
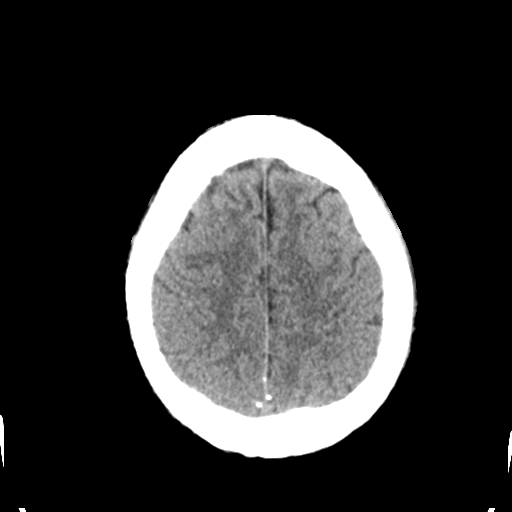
[im 25/30  brain]
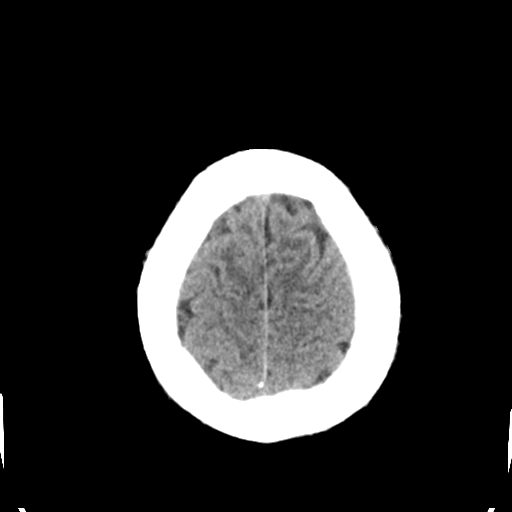
[im 25/30  bone]
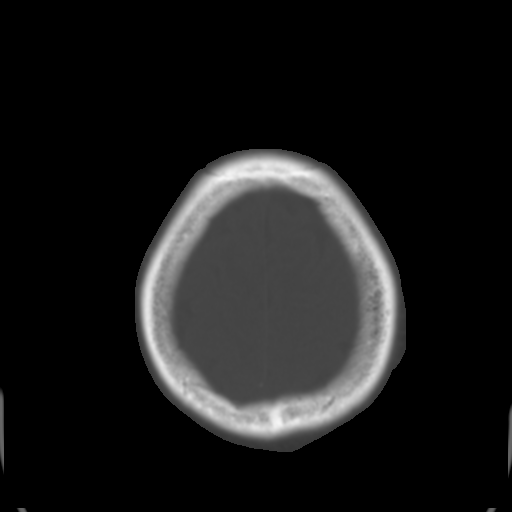
[im 28/30  brain]
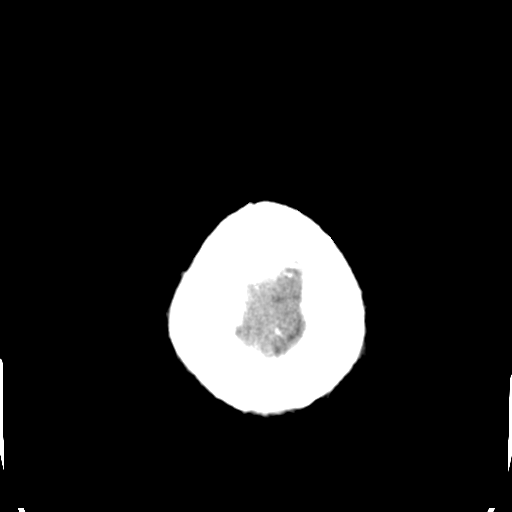

[Series 4: coronal soft tissue · coronal · 0.34mm/px · 3 of 72 slices shown]
[im 24/72  brain]
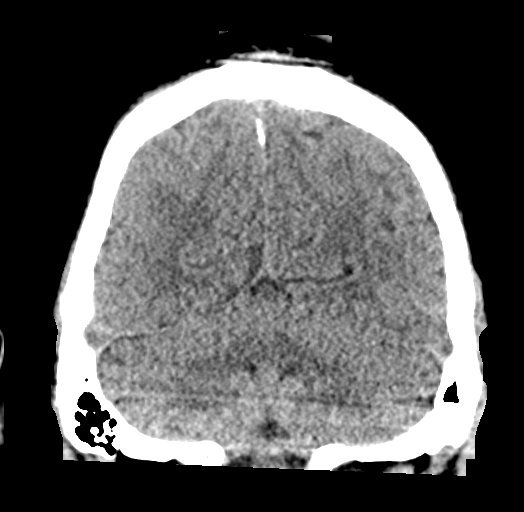
[im 32/72  brain]
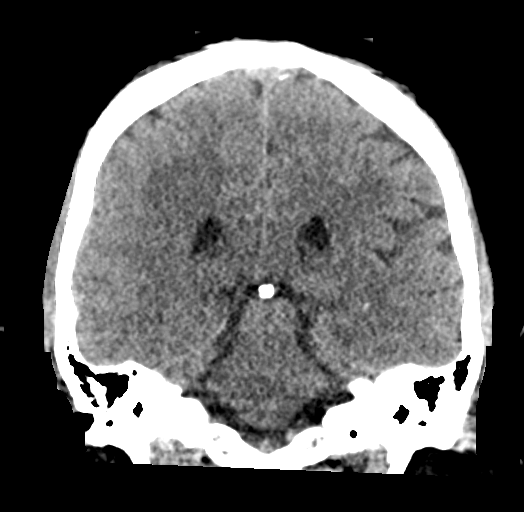
[im 40/72  brain]
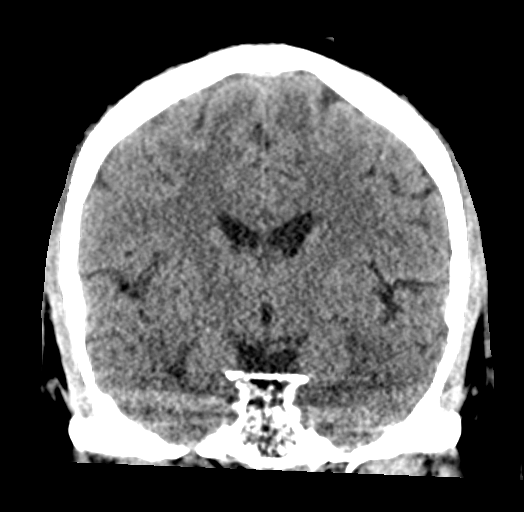

[Series 5: sagittal soft tissue · sagittal · 0.34mm/px · 3 of 59 slices shown]
[im 20/59  brain]
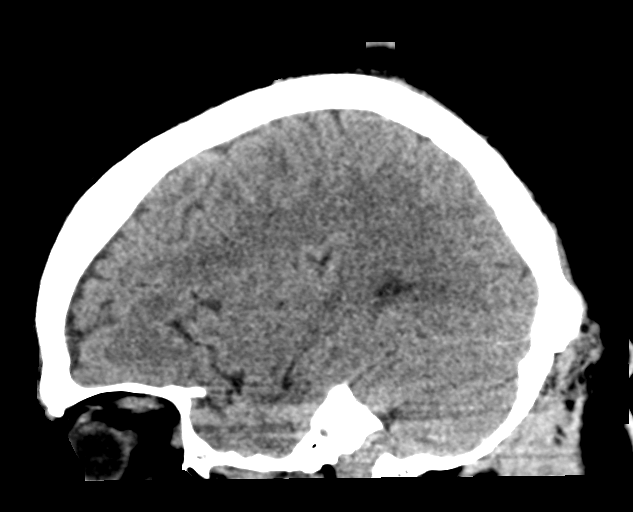
[im 30/59  brain]
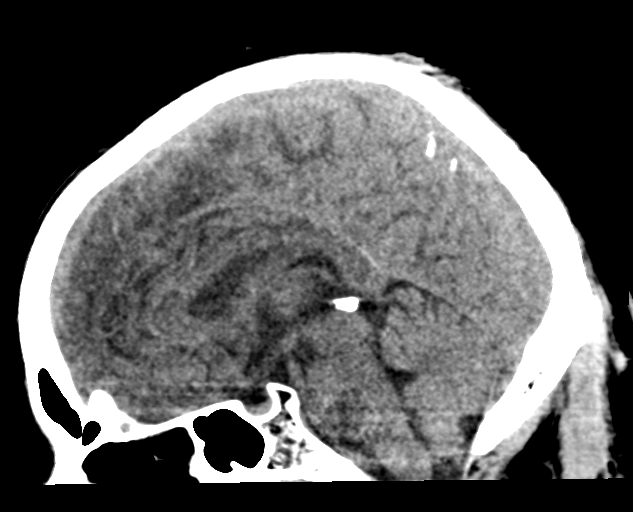
[im 39/59  brain]
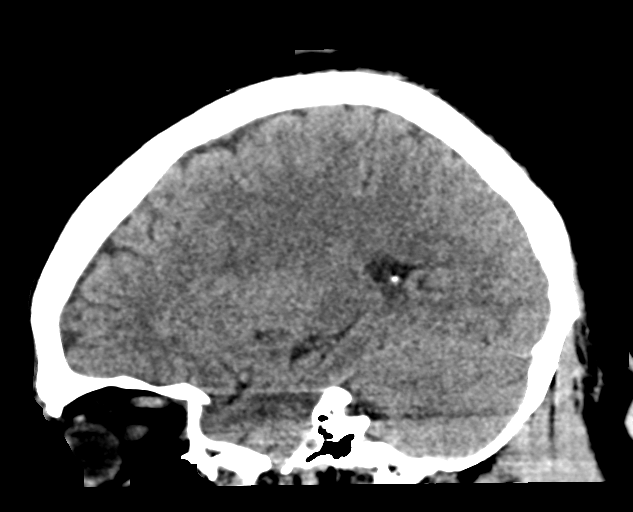

[16 of 47 positions shown; findings below may reference images not displayed]

FINDINGS: Brain: The ventricles are normal in size and configuration. There is
no intracranial mass, hemorrhage, extra-axial fluid collection, or
midline shift. The brain parenchyma appears unremarkable. No acute
infarct evident. Benign-appearing calcification is noted in the
pineal region.

Vascular: No hyperdense vessel. There is mild calcification in the
distal vertebral artery on the left.

Skull: Bony calvarium appears intact.

Sinuses/Orbits: There is mild mucosal thickening in an ethmoid air
cell on the right posteriorly. Other visualized paranasal sinuses
are clear. Visualized orbits appear symmetric bilaterally.

Other: Mastoid air cells are clear.
IMPRESSION: No mass or hemorrhage. No acute infarct. Slight arterial vascular
calcification. Mucosal thickening noted in a single posterior
ethmoid air cell on the right.
